# Patient Record
Sex: Female | Born: 1935 | Race: White | Hispanic: No | Marital: Married | Smoking: Never smoker
Health system: Southern US, Community
[De-identification: ages and names within clinical notes are randomized; demographics above are authoritative.]

## PROBLEM LIST (undated history)

## (undated) DIAGNOSIS — I10 Essential (primary) hypertension: Secondary | ICD-10-CM

## (undated) DIAGNOSIS — I4891 Unspecified atrial fibrillation: Secondary | ICD-10-CM

## (undated) DIAGNOSIS — I2510 Atherosclerotic heart disease of native coronary artery without angina pectoris: Secondary | ICD-10-CM

## (undated) DIAGNOSIS — K219 Gastro-esophageal reflux disease without esophagitis: Secondary | ICD-10-CM

## (undated) DIAGNOSIS — I509 Heart failure, unspecified: Secondary | ICD-10-CM

## (undated) DIAGNOSIS — I272 Pulmonary hypertension, unspecified: Secondary | ICD-10-CM

## (undated) DIAGNOSIS — B029 Zoster without complications: Secondary | ICD-10-CM

## (undated) DIAGNOSIS — N189 Chronic kidney disease, unspecified: Secondary | ICD-10-CM

## (undated) HISTORY — PX: MASTECTOMY: SHX3

## (undated) HISTORY — PX: MITRAL VALVE REPAIR: SHX2039

## (undated) HISTORY — PX: THORACENTESIS: SHX235

## (undated) HISTORY — PX: PACEMAKER PLACEMENT: SHX43

## (undated) HISTORY — PX: BACK SURGERY: SHX140

## (undated) HISTORY — PX: EYE SURGERY: SHX253

## (undated) HISTORY — PX: CORONARY ARTERY BYPASS GRAFT: SHX141

## (undated) HISTORY — PX: CARDIAC CATHETERIZATION: SHX172

---

## 1998-10-14 ENCOUNTER — Ambulatory Visit (HOSPITAL_COMMUNITY): Admission: RE | Admit: 1998-10-14 | Discharge: 1998-10-14 | Payer: Self-pay | Admitting: Obstetrics & Gynecology

## 2003-06-05 ENCOUNTER — Other Ambulatory Visit: Payer: Self-pay

## 2004-09-01 ENCOUNTER — Ambulatory Visit: Payer: Self-pay | Admitting: Family Medicine

## 2005-07-29 ENCOUNTER — Ambulatory Visit: Payer: Self-pay | Admitting: Unknown Physician Specialty

## 2005-09-14 ENCOUNTER — Ambulatory Visit: Payer: Self-pay | Admitting: Family Medicine

## 2006-08-03 ENCOUNTER — Ambulatory Visit: Payer: Self-pay | Admitting: Family Medicine

## 2006-09-16 ENCOUNTER — Ambulatory Visit: Payer: Self-pay | Admitting: Family Medicine

## 2007-09-19 ENCOUNTER — Ambulatory Visit: Payer: Self-pay | Admitting: Family Medicine

## 2008-09-19 ENCOUNTER — Ambulatory Visit: Payer: Self-pay | Admitting: Family Medicine

## 2009-01-07 ENCOUNTER — Ambulatory Visit: Payer: Self-pay | Admitting: Family Medicine

## 2009-03-21 ENCOUNTER — Ambulatory Visit: Payer: Self-pay | Admitting: Unknown Physician Specialty

## 2009-10-23 ENCOUNTER — Ambulatory Visit: Payer: Self-pay | Admitting: Unknown Physician Specialty

## 2009-11-14 ENCOUNTER — Ambulatory Visit: Payer: Self-pay | Admitting: Unknown Physician Specialty

## 2010-04-28 ENCOUNTER — Ambulatory Visit: Payer: Self-pay | Admitting: Unknown Physician Specialty

## 2010-05-05 ENCOUNTER — Inpatient Hospital Stay: Payer: Self-pay | Admitting: Internal Medicine

## 2010-12-16 ENCOUNTER — Ambulatory Visit: Payer: Self-pay | Admitting: Unknown Physician Specialty

## 2011-09-08 ENCOUNTER — Ambulatory Visit: Payer: Self-pay | Admitting: Cardiology

## 2011-09-08 LAB — PROTIME-INR: INR: 0.9

## 2011-12-08 ENCOUNTER — Encounter: Payer: Self-pay | Admitting: Cardiology

## 2012-01-08 ENCOUNTER — Encounter: Payer: Self-pay | Admitting: Cardiology

## 2012-02-07 ENCOUNTER — Encounter: Payer: Self-pay | Admitting: Cardiology

## 2012-03-09 ENCOUNTER — Encounter: Payer: Self-pay | Admitting: Cardiology

## 2012-04-09 ENCOUNTER — Encounter: Payer: Self-pay | Admitting: Cardiology

## 2012-10-12 ENCOUNTER — Ambulatory Visit: Payer: Self-pay | Admitting: Internal Medicine

## 2013-02-18 ENCOUNTER — Emergency Department: Payer: Self-pay | Admitting: Emergency Medicine

## 2013-02-18 LAB — CBC
HCT: 35.5 % (ref 35.0–47.0)
HGB: 11.8 g/dL — ABNORMAL LOW (ref 12.0–16.0)
MCH: 29.7 pg (ref 26.0–34.0)
MCHC: 33.3 g/dL (ref 32.0–36.0)
MCV: 89 fL (ref 80–100)
Platelet: 177 10*3/uL (ref 150–440)
RBC: 3.97 10*6/uL (ref 3.80–5.20)

## 2013-02-18 LAB — COMPREHENSIVE METABOLIC PANEL
Albumin: 3.6 g/dL (ref 3.4–5.0)
Anion Gap: 4 — ABNORMAL LOW (ref 7–16)
Bilirubin,Total: 0.3 mg/dL (ref 0.2–1.0)
Calcium, Total: 9.2 mg/dL (ref 8.5–10.1)
Creatinine: 1.03 mg/dL (ref 0.60–1.30)
EGFR (African American): 60 — ABNORMAL LOW
Glucose: 92 mg/dL (ref 65–99)
Osmolality: 278 (ref 275–301)

## 2013-02-18 LAB — TROPONIN I: Troponin-I: <0.02 ng/mL

## 2013-02-18 LAB — URINALYSIS, COMPLETE
Glucose,UR: NEGATIVE mg/dL (ref 0–75)
Leukocyte Esterase: NEGATIVE
Nitrite: NEGATIVE
RBC,UR: <1 /HPF (ref 0–5)
Squamous Epithelial: <1

## 2013-02-18 LAB — PROTIME-INR: Prothrombin Time: 24.9 secs — ABNORMAL HIGH (ref 11.5–14.7)

## 2013-11-03 ENCOUNTER — Ambulatory Visit: Payer: Self-pay | Admitting: Internal Medicine

## 2014-09-17 ENCOUNTER — Other Ambulatory Visit: Payer: Self-pay | Admitting: Specialist

## 2014-09-17 DIAGNOSIS — J986 Disorders of diaphragm: Secondary | ICD-10-CM

## 2014-09-25 ENCOUNTER — Ambulatory Visit
Admission: RE | Admit: 2014-09-25 | Discharge: 2014-09-25 | Disposition: A | Discharge status: Home / Self Care | Payer: Medicare Other | Source: Ambulatory Visit | Attending: Specialist | Admitting: Specialist

## 2014-09-25 DIAGNOSIS — J90 Pleural effusion, not elsewhere classified: Secondary | ICD-10-CM | POA: Insufficient documentation

## 2014-09-25 DIAGNOSIS — J986 Disorders of diaphragm: Secondary | ICD-10-CM

## 2014-09-25 DIAGNOSIS — R0609 Other forms of dyspnea: Secondary | ICD-10-CM | POA: Diagnosis present

## 2014-09-25 DIAGNOSIS — Z950 Presence of cardiac pacemaker: Secondary | ICD-10-CM | POA: Diagnosis not present

## 2014-10-16 ENCOUNTER — Other Ambulatory Visit: Payer: Self-pay | Admitting: Specialist

## 2014-10-16 ENCOUNTER — Observation Stay
Admission: EM | Admit: 2014-10-16 | Discharge: 2014-10-17 | Disposition: A | Discharge status: Home / Self Care | Payer: Medicare Other | Attending: Internal Medicine | Admitting: Internal Medicine

## 2014-10-16 ENCOUNTER — Encounter: Payer: Self-pay | Admitting: *Deleted

## 2014-10-16 ENCOUNTER — Emergency Department: Payer: Medicare Other

## 2014-10-16 DIAGNOSIS — Z79899 Other long term (current) drug therapy: Secondary | ICD-10-CM | POA: Diagnosis not present

## 2014-10-16 DIAGNOSIS — R748 Abnormal levels of other serum enzymes: Secondary | ICD-10-CM | POA: Diagnosis not present

## 2014-10-16 DIAGNOSIS — R110 Nausea: Secondary | ICD-10-CM | POA: Diagnosis not present

## 2014-10-16 DIAGNOSIS — J90 Pleural effusion, not elsewhere classified: Secondary | ICD-10-CM | POA: Insufficient documentation

## 2014-10-16 DIAGNOSIS — R918 Other nonspecific abnormal finding of lung field: Secondary | ICD-10-CM | POA: Insufficient documentation

## 2014-10-16 DIAGNOSIS — R1030 Lower abdominal pain, unspecified: Secondary | ICD-10-CM | POA: Diagnosis not present

## 2014-10-16 DIAGNOSIS — Z880 Allergy status to penicillin: Secondary | ICD-10-CM | POA: Insufficient documentation

## 2014-10-16 DIAGNOSIS — Z881 Allergy status to other antibiotic agents status: Secondary | ICD-10-CM | POA: Diagnosis not present

## 2014-10-16 DIAGNOSIS — I2510 Atherosclerotic heart disease of native coronary artery without angina pectoris: Secondary | ICD-10-CM | POA: Diagnosis not present

## 2014-10-16 DIAGNOSIS — Z95810 Presence of automatic (implantable) cardiac defibrillator: Secondary | ICD-10-CM | POA: Diagnosis not present

## 2014-10-16 DIAGNOSIS — R7989 Other specified abnormal findings of blood chemistry: Secondary | ICD-10-CM

## 2014-10-16 DIAGNOSIS — R05 Cough: Secondary | ICD-10-CM | POA: Diagnosis not present

## 2014-10-16 DIAGNOSIS — Z951 Presence of aortocoronary bypass graft: Secondary | ICD-10-CM | POA: Diagnosis not present

## 2014-10-16 DIAGNOSIS — Z7982 Long term (current) use of aspirin: Secondary | ICD-10-CM | POA: Insufficient documentation

## 2014-10-16 DIAGNOSIS — K5900 Constipation, unspecified: Secondary | ICD-10-CM | POA: Diagnosis present

## 2014-10-16 DIAGNOSIS — E785 Hyperlipidemia, unspecified: Secondary | ICD-10-CM | POA: Diagnosis not present

## 2014-10-16 DIAGNOSIS — I5022 Chronic systolic (congestive) heart failure: Secondary | ICD-10-CM | POA: Insufficient documentation

## 2014-10-16 DIAGNOSIS — R0602 Shortness of breath: Secondary | ICD-10-CM | POA: Diagnosis not present

## 2014-10-16 DIAGNOSIS — R0600 Dyspnea, unspecified: Secondary | ICD-10-CM

## 2014-10-16 DIAGNOSIS — N184 Chronic kidney disease, stage 4 (severe): Secondary | ICD-10-CM | POA: Diagnosis present

## 2014-10-16 DIAGNOSIS — K219 Gastro-esophageal reflux disease without esophagitis: Secondary | ICD-10-CM | POA: Diagnosis not present

## 2014-10-16 DIAGNOSIS — R778 Other specified abnormalities of plasma proteins: Secondary | ICD-10-CM | POA: Diagnosis present

## 2014-10-16 DIAGNOSIS — Z7901 Long term (current) use of anticoagulants: Secondary | ICD-10-CM | POA: Diagnosis not present

## 2014-10-16 DIAGNOSIS — I4891 Unspecified atrial fibrillation: Secondary | ICD-10-CM | POA: Insufficient documentation

## 2014-10-16 DIAGNOSIS — Z882 Allergy status to sulfonamides status: Secondary | ICD-10-CM | POA: Insufficient documentation

## 2014-10-16 HISTORY — DX: Chronic kidney disease, unspecified: N18.9

## 2014-10-16 HISTORY — DX: Atherosclerotic heart disease of native coronary artery without angina pectoris: I25.10

## 2014-10-16 HISTORY — DX: Unspecified atrial fibrillation: I48.91

## 2014-10-16 HISTORY — DX: Heart failure, unspecified: I50.9

## 2014-10-16 HISTORY — DX: Pulmonary hypertension, unspecified: I27.20

## 2014-10-16 HISTORY — DX: Zoster without complications: B02.9

## 2014-10-16 HISTORY — DX: Gastro-esophageal reflux disease without esophagitis: K21.9

## 2014-10-16 LAB — CBC
HCT: 33.6 % — ABNORMAL LOW (ref 35.0–47.0)
Hemoglobin: 11 g/dL — ABNORMAL LOW (ref 12.0–16.0)
MCH: 27.1 pg (ref 26.0–34.0)
MCHC: 32.6 g/dL (ref 32.0–36.0)
MCV: 83.1 fL (ref 80.0–100.0)
Platelets: 218 10*3/uL (ref 150–440)
RBC: 4.04 MIL/uL (ref 3.80–5.20)
RDW: 15.4 % — AB (ref 11.5–14.5)
WBC: 7.8 10*3/uL (ref 3.6–11.0)

## 2014-10-16 LAB — LIPASE, BLOOD: LIPASE: 24 U/L (ref 22–51)

## 2014-10-16 LAB — COMPREHENSIVE METABOLIC PANEL
ALBUMIN: 3.6 g/dL (ref 3.5–5.0)
ALT: 34 U/L (ref 14–54)
ANION GAP: 14 (ref 5–15)
AST: 49 U/L — ABNORMAL HIGH (ref 15–41)
Alkaline Phosphatase: 87 U/L (ref 38–126)
BUN: 43 mg/dL — ABNORMAL HIGH (ref 6–20)
CO2: 22 mmol/L (ref 22–32)
Calcium: 8.9 mg/dL (ref 8.9–10.3)
Chloride: 98 mmol/L — ABNORMAL LOW (ref 101–111)
Creatinine, Ser: 2.3 mg/dL — ABNORMAL HIGH (ref 0.44–1.00)
GFR calc Af Amer: 22 mL/min — ABNORMAL LOW (ref >60)
GFR, EST NON AFRICAN AMERICAN: 19 mL/min — AB (ref >60)
Glucose, Bld: 119 mg/dL — ABNORMAL HIGH (ref 65–99)
Potassium: 4.5 mmol/L (ref 3.5–5.1)
Sodium: 134 mmol/L — ABNORMAL LOW (ref 135–145)
TOTAL PROTEIN: 6.6 g/dL (ref 6.5–8.1)
Total Bilirubin: 0.9 mg/dL (ref 0.3–1.2)

## 2014-10-16 LAB — TROPONIN I: Troponin I: 0.13 ng/mL — ABNORMAL HIGH (ref <0.031)

## 2014-10-16 MED ORDER — POLYETHYLENE GLYCOL 3350 17 GM/SCOOP PO POWD: 17.0000 g | Freq: Every day | ORAL | Status: DC

## 2014-10-16 NOTE — ED Notes (Addendum)
Pt to triage via w/c with no distress noted; reports SOB, nausea x 3-4 weeks; has thoracentesis sched for Monday with Dr Raul Del; pt also c/o lower abd pain; pt with pacemaker and defib; swelling to LE(s), nonprod cough

## 2014-10-16 NOTE — ED Notes (Signed)
Lab called with troponin of 0.13  Dr sung aware.

## 2014-10-16 NOTE — ED Provider Notes (Signed)
[facility] Emergency Department Provider Note  Time seen: 10:24 PM  I have reviewed the triage vital signs and the nursing notes.   HISTORY  Chief Complaint Shortness of Breath    HPI Sheryl Hughes is a 81 y.o. female with past medical history of atrial fibrillation on Coumadin, congestive heart failure on Lasix with a known pleural effusion, presents the emergency department for shortness of breath as well as constipation. According to the patient her main concern is her irregular bowel movements. The patient states for the past 6 weeks she has been having trouble with her bowel movements. She states she has a bowel movement every few days, but is very difficult for her. She is taking Colace twice daily which she states is helping, but she continues to go several days between bowel movements. Her last bowel movement was this morning which she states was fairly normal for her. She states she will get a lot of gas throughout the day including today. As far as her pleural effusion, and shortness of breath, she is scheduled for a thoracentesis in 4 days. Patient was initially scheduled for her thoracentesis tomorrow, but she did not discontinue her Coumadin, so they delayed her until Monday. Patient states her shortness of breath has not acutely worsened, and she states she would not come to the emergency department for that, she is here for her abdominal discomfort and constipation issues. Denies any abdominal pain currently. Denies any distention. Denies any vomiting although she states she is nauseated. The patient states she stays nauseated most days, with a decreased appetite but does not know why. Denies any fever. Denies dysuria.     Past Medical History  Diagnosis Date  . Atrial fibrillation     There are no active problems to display for this patient.   No past surgical history on file.  Current Outpatient Rx  Name  Route  Sig  Dispense  Refill  .  acetaminophen (TYLENOL) 325 MG tablet   Oral   Take 650 mg by mouth every 6 (six) hours as needed.         Marland Kitchen aspirin EC 81 MG tablet   Oral   Take 81 mg by mouth daily.         Marland Kitchen atorvastatin (LIPITOR) 20 MG tablet   Oral   Take 20 mg by mouth daily.         Marland Kitchen CALCIUM-VITAMIN D PO   Oral   Take 1 tablet by mouth daily.         . Cholecalciferol 2000 UNITS TABS   Oral   Take 1 tablet by mouth daily.         Marland Kitchen docusate sodium (COLACE) 100 MG capsule   Oral   Take 100 mg by mouth 2 (two) times daily.         Marland Kitchen dronedarone (MULTAQ) 400 MG tablet   Oral   Take 400 mg by mouth 2 (two) times daily with a meal.         . furosemide (LASIX) 40 MG tablet   Oral   Take 40 mg by mouth daily.         Marland Kitchen losartan (COZAAR) 25 MG tablet   Oral   Take 25 mg by mouth daily.         . metoprolol succinate (TOPROL-XL) 25 MG 24 hr tablet   Oral   Take 25 mg by mouth 2 (two) times daily.         Marland Kitchen  omeprazole (PRILOSEC) 20 MG capsule   Oral   Take 20 mg by mouth daily.         . potassium chloride (MICRO-K) 10 MEQ CR capsule   Oral   Take 10 mEq by mouth daily.         . vitamin B-12 (CYANOCOBALAMIN) 1000 MCG tablet   Oral   Take 1,000 mcg by mouth daily.         Marland Kitchen warfarin (COUMADIN) 5 MG tablet   Oral   Take 5 mg by mouth once a week. Pt takes 1-2 times weekly           Allergies Biaxin; Flagyl; Penicillins; and Tetracyclines & related  No family history on file.  Social History History  Substance Use Topics  . Smoking status: Not on file  . Smokeless tobacco: Not on file  . Alcohol Use: Not on file    Review of Systems Constitutional: Negative for fever. Cardiovascular: Negative for chest pain. Respiratory: Positive for shortness breath, but patient states this is unchanged. Gastrointestinal: Intermittent abdominal discomfort, positive for nausea, negative for vomiting or diarrhea. Positive for intermittent constipation. Neurological:  Negative for headaches, focal weakness or numbness. 10-point ROS otherwise negative.  ____________________________________________   PHYSICAL EXAM:  VITAL SIGNS: ED Triage Vitals  Enc Vitals Group     BP 10/16/14 2048 101/59 mmHg     Pulse Rate 10/16/14 2048 112     Resp 10/16/14 2140 24     Temp 10/16/14 2048 97.9 F (36.6 C)     Temp Source 10/16/14 2048 Oral     SpO2 10/16/14 2048 100 %     Weight 10/16/14 2048 109 lb (49.442 kg)     Height 10/16/14 2048 5\' 4"  (1.626 m)     Head Cir --      Peak Flow --      Pain Score 10/16/14 2044 10     Pain Loc --      Pain Edu? --      Excl. in [place]? --     Constitutional: Alert and oriented. Well appearing and in no distress. Eyes: Normal exam ENT   Mouth/Throat: Mucous membranes are moist. Cardiovascular: Regular rhythm, rate around 100 110 bpm.  Respiratory: Normal respiratory effort without tachypnea nor retractions. Breath sounds are clear and equal bilaterally. No wheezes/rales/rhonchi. Gastrointestinal: Soft and nontender. No distention. Dull percussion. Musculoskeletal: Nontender with normal range of motion in all extremities.  Neurologic:  Normal speech and language. No gross focal neurologic deficits Skin:  Skin is warm, dry and intact.  Psychiatric: Mood and affect are normal. Speech and behavior are normal.  ____________________________________________    EKG  EKG reviewed and interpreted by me shows what appears to be atrial fibrillation 112 bpm, widened QRS, nonspecific ST changes are present. No ST elevations noted.  ____________________________________________    RADIOLOGY  Three-way abdomen does not appear to show any acute abnormalities. Small bilateral pleural effusions.  ____________________________________________   INITIAL IMPRESSION / ASSESSMENT AND PLAN / ED COURSE  Pertinent labs & imaging results that were available during my care of the patient were reviewed by me and considered in my  medical decision making (see chart for details).  Patient here for various complaints including shortness of breath, and constipation. She states she is always short of breath, and was told she will be so until she has this thoracentesis done on Monday. Patient's main complaint today is constipation but she states she had a normal bowel movement  this morning. Denies any abdominal pain currently, does state nausea which she states is been constant for 6 weeks. Denies any vomiting. Denies diarrhea or fever. We will check basic labs, abdominal and chest x-rays, and monitor closely in the emergency department while awaiting laboratory results.  Three-way abdomen x-rays largely unremarkable. Small pleural effusions. Overall the patient appears very well. Labs are currently pending. If labs are within normal limits anticipated discharge home with primary care follow-up. Patient is agreeable to this plan. I also discussed with the patient MiraLAX as needed for constipation.   Labs pending, patient care signed out to Dr. Beather Arbour. ____________________________________________   FINAL CLINICAL IMPRESSION(S) / ED DIAGNOSES  Constipation Dyspnea   Harvest Dark, MD 10/16/14 2314

## 2014-10-16 NOTE — ED Notes (Signed)
Resumed care from sonjia rn. Pt alert.  Pt has sob tonight.  No chest pain. Family with pt.

## 2014-10-17 ENCOUNTER — Encounter: Payer: Self-pay | Admitting: Internal Medicine

## 2014-10-17 DIAGNOSIS — R0602 Shortness of breath: Secondary | ICD-10-CM | POA: Diagnosis not present

## 2014-10-17 DIAGNOSIS — I4891 Unspecified atrial fibrillation: Secondary | ICD-10-CM | POA: Diagnosis present

## 2014-10-17 DIAGNOSIS — E785 Hyperlipidemia, unspecified: Secondary | ICD-10-CM | POA: Diagnosis present

## 2014-10-17 DIAGNOSIS — K219 Gastro-esophageal reflux disease without esophagitis: Secondary | ICD-10-CM | POA: Diagnosis present

## 2014-10-17 DIAGNOSIS — I5022 Chronic systolic (congestive) heart failure: Secondary | ICD-10-CM | POA: Diagnosis present

## 2014-10-17 DIAGNOSIS — K5900 Constipation, unspecified: Secondary | ICD-10-CM | POA: Diagnosis present

## 2014-10-17 DIAGNOSIS — R7989 Other specified abnormal findings of blood chemistry: Secondary | ICD-10-CM | POA: Diagnosis present

## 2014-10-17 DIAGNOSIS — I2510 Atherosclerotic heart disease of native coronary artery without angina pectoris: Secondary | ICD-10-CM | POA: Diagnosis present

## 2014-10-17 DIAGNOSIS — N184 Chronic kidney disease, stage 4 (severe): Secondary | ICD-10-CM | POA: Diagnosis present

## 2014-10-17 DIAGNOSIS — R778 Other specified abnormalities of plasma proteins: Secondary | ICD-10-CM | POA: Diagnosis present

## 2014-10-17 LAB — URINALYSIS COMPLETE WITH MICROSCOPIC (ARMC ONLY)
BACTERIA UA: NONE SEEN
Bilirubin Urine: NEGATIVE
Glucose, UA: NEGATIVE mg/dL
Ketones, ur: NEGATIVE mg/dL
LEUKOCYTES UA: NEGATIVE
Nitrite: NEGATIVE
Protein, ur: 30 mg/dL — AB
Specific Gravity, Urine: 1.011 (ref 1.005–1.030)
pH: 5 (ref 5.0–8.0)

## 2014-10-17 LAB — CBC
HEMATOCRIT: 31 % — AB (ref 35.0–47.0)
Hemoglobin: 10.1 g/dL — ABNORMAL LOW (ref 12.0–16.0)
MCH: 27.2 pg (ref 26.0–34.0)
MCHC: 32.6 g/dL (ref 32.0–36.0)
MCV: 83.4 fL (ref 80.0–100.0)
Platelets: 176 10*3/uL (ref 150–440)
RBC: 3.71 MIL/uL — AB (ref 3.80–5.20)
RDW: 15.3 % — ABNORMAL HIGH (ref 11.5–14.5)
WBC: 6.2 10*3/uL (ref 3.6–11.0)

## 2014-10-17 LAB — BASIC METABOLIC PANEL
ANION GAP: 11 (ref 5–15)
BUN: 43 mg/dL — AB (ref 6–20)
CALCIUM: 8.6 mg/dL — AB (ref 8.9–10.3)
CO2: 26 mmol/L (ref 22–32)
CREATININE: 2.27 mg/dL — AB (ref 0.44–1.00)
Chloride: 99 mmol/L — ABNORMAL LOW (ref 101–111)
GFR, EST AFRICAN AMERICAN: 22 mL/min — AB (ref >60)
GFR, EST NON AFRICAN AMERICAN: 19 mL/min — AB (ref >60)
GLUCOSE: 109 mg/dL — AB (ref 65–99)
POTASSIUM: 4.2 mmol/L (ref 3.5–5.1)
Sodium: 136 mmol/L (ref 135–145)

## 2014-10-17 LAB — PROTIME-INR
INR: 5.11
INR: 5.12 — AB
PROTHROMBIN TIME: 47.1 s — AB (ref 11.4–15.0)
Prothrombin Time: 47 seconds — ABNORMAL HIGH (ref 11.4–15.0)

## 2014-10-17 LAB — TROPONIN I: Troponin I: 0.09 ng/mL — ABNORMAL HIGH (ref <0.031)

## 2014-10-17 MED ORDER — ATORVASTATIN CALCIUM 20 MG PO TABS
20.0000 mg | ORAL_TABLET | Freq: Every day | ORAL | Status: DC
  Administered 2014-10-17: 20 mg via ORAL
  Filled 2014-10-17: qty 1

## 2014-10-17 MED ORDER — METOPROLOL SUCCINATE ER 25 MG PO TB24
25.0000 mg | ORAL_TABLET | Freq: Two times a day (BID) | ORAL | Status: DC
  Administered 2014-10-17: 25 mg via ORAL
  Filled 2014-10-17: qty 1

## 2014-10-17 MED ORDER — SENNOSIDES-DOCUSATE SODIUM 8.6-50 MG PO TABS
1.0000 | ORAL_TABLET | Freq: Every evening | ORAL | Status: DC | PRN
Start: 2014-10-17 — End: 2014-10-17

## 2014-10-17 MED ORDER — ASPIRIN EC 81 MG PO TBEC
81.0000 mg | DELAYED_RELEASE_TABLET | Freq: Every day | ORAL | Status: DC
  Filled 2014-10-17: qty 1

## 2014-10-17 MED ORDER — ACETAMINOPHEN 325 MG PO TABS: 650.0000 mg | ORAL_TABLET | Freq: Four times a day (QID) | ORAL | Status: DC | PRN

## 2014-10-17 MED ORDER — PANTOPRAZOLE SODIUM 40 MG PO TBEC
40.0000 mg | DELAYED_RELEASE_TABLET | Freq: Every day | ORAL | Status: DC
  Administered 2014-10-17: 40 mg via ORAL
  Filled 2014-10-17: qty 1

## 2014-10-17 MED ORDER — DRONEDARONE HCL 400 MG PO TABS
400.0000 mg | ORAL_TABLET | Freq: Two times a day (BID) | ORAL | Status: DC
  Administered 2014-10-17: 400 mg via ORAL
  Filled 2014-10-17 (×3): qty 1

## 2014-10-17 MED ORDER — POLYETHYLENE GLYCOL 3350 17 G PO PACK: 17.0000 g | PACK | Freq: Every day | ORAL | Status: DC

## 2014-10-17 MED ORDER — ONDANSETRON HCL 4 MG/2ML IJ SOLN
4.0000 mg | Freq: Four times a day (QID) | INTRAMUSCULAR | Status: DC | PRN
  Administered 2014-10-17: 4 mg via INTRAVENOUS

## 2014-10-17 MED ORDER — ONDANSETRON HCL 4 MG PO TABS: 4.0000 mg | ORAL_TABLET | Freq: Four times a day (QID) | ORAL | Status: DC | PRN

## 2014-10-17 MED ORDER — ACETAMINOPHEN 650 MG RE SUPP: 650.0000 mg | Freq: Four times a day (QID) | RECTAL | Status: DC | PRN

## 2014-10-17 MED ORDER — POLYETHYLENE GLYCOL 3350 17 G PO PACK
17.0000 g | PACK | Freq: Once | ORAL | Status: AC
  Administered 2014-10-17: 17 g via ORAL
  Filled 2014-10-17: qty 1

## 2014-10-17 MED ORDER — SODIUM CHLORIDE 0.9 % IJ SOLN
3.0000 mL | Freq: Two times a day (BID) | INTRAMUSCULAR | Status: DC
Start: 2014-10-17 — End: 2014-10-17
  Administered 2014-10-17 (×2): 3 mL via INTRAVENOUS

## 2014-10-17 NOTE — Discharge Instructions (Signed)
Constipation Constipation is when a person has fewer than three bowel movements a week, has difficulty having a bowel movement, or has stools that are dry, hard, or larger than normal. As people grow older, constipation is more common. If you try to fix constipation with medicines that make you have a bowel movement (laxatives), the problem may get worse. Long-term laxative use may cause the muscles of the colon to become weak. A low-fiber diet, not taking in enough fluids, and taking certain medicines may make constipation worse.  CAUSES   Certain medicines, such as antidepressants, pain medicine, iron supplements, antacids, and water pills.   Certain diseases, such as diabetes, irritable bowel syndrome (IBS), thyroid disease, or depression.   Not drinking enough water.   Not eating enough fiber-rich foods.   Stress or travel.   Lack of physical activity or exercise.   Ignoring the urge to have a bowel movement.   Using laxatives too much.  SIGNS AND SYMPTOMS   Having fewer than three bowel movements a week.   Straining to have a bowel movement.   Having stools that are hard, dry, or larger than normal.   Feeling full or bloated.   Pain in the lower abdomen.   Not feeling relief after having a bowel movement.  DIAGNOSIS  Your health care provider will take a medical history and perform a physical exam. Further testing may be done for severe constipation. Some tests may include:  A barium enema X-ray to examine your rectum, colon, and, sometimes, your small intestine.   A sigmoidoscopy to examine your lower colon.   A colonoscopy to examine your entire colon. TREATMENT  Treatment will depend on the severity of your constipation and what is causing it. Some dietary treatments include drinking more fluids and eating more fiber-rich foods. Lifestyle treatments may include regular exercise. If these diet and lifestyle recommendations do not help, your health  care provider may recommend taking over-the-counter laxative medicines to help you have bowel movements. Prescription medicines may be prescribed if over-the-counter medicines do not work.  HOME CARE INSTRUCTIONS   Eat foods that have a lot of fiber, such as fruits, vegetables, whole grains, and beans.  Limit foods high in fat and processed sugars, such as french fries, hamburgers, cookies, candies, and soda.   A fiber supplement may be added to your diet if you cannot get enough fiber from foods.   Drink enough fluids to keep your urine clear or pale yellow.   Exercise regularly or as directed by your health care provider.   Go to the restroom when you have the urge to go. Do not hold it.   Only take over-the-counter or prescription medicines as directed by your health care provider. Do not take other medicines for constipation without talking to your health care provider first.  [place] IF:   You have bright red blood in your stool.   Your constipation lasts for more than 4 days or gets worse.   You have abdominal or rectal pain.   You have thin, pencil-like stools.   You have unexplained weight loss. MAKE SURE YOU:   Understand these instructions.  Will watch your condition.  Will get help right away if you are not doing well or get worse. Document Released: 11/22/2003 Document Revised: 02/28/2013 Document Reviewed: 12/05/2012 [facility] Patient Information 2015 [place], [place]. This information is not intended to replace advice given to you by your health care provider. Make sure you discuss any questions  you have with your health care provider.   Cardiac diet  Activity as tolerated  Hold coumadin till your procedure on Monday.  Follow up with Dr. Irwin Brakeman.  Please call Dr. Vira Agar if your nausea doesn't improve.

## 2014-10-17 NOTE — H&P (Signed)
[facility] [place] NAME: Sheryl Hughes    MR#:  [id number]  DATE OF BIRTH:  03-Nov-1931  DATE OF ADMISSION:  10/16/2014  PRIMARY CARE PHYSICIAN: Kirk Ruths., MD   REQUESTING/REFERRING PHYSICIAN: Beather Arbour, M.D.  CHIEF COMPLAINT:   Chief Complaint  Patient presents with  . Shortness of Breath    HISTORY OF PRESENT ILLNESS:  Kei Hugger  is a 81 y.o. female who presents with progressive GI upset with nausea. Patient states that she's been having these symptoms intermittently for the past 2 weeks. Patient has a significant history of coronary disease, with prior bypass surgery. She also has a history of significant valvular abnormalities, with mitral and tricuspid valve repair surgery in the past. She has atrial fibrillation, and is on Coumadin for the same. She has bilateral pleural effusions which required thoracentesis in the past, and she is also been having shortness of breath recently with her GI upset. She was scheduled for thoracentesis, but had to have this date moved as she was not told, or did not recall to hold her Coumadin and her INR has not fallen to safe level for the procedure to be performed. On evaluation in the ED today her troponin is mildly positive at 0.13. Hospitalists were called for admission for rule out ACS. Of note her INR was supratherapeutic at 5.  PAST MEDICAL HISTORY:   Past Medical History  Diagnosis Date  . Atrial fibrillation   . CHF (congestive heart failure)   . Shingles     PAST SURGICAL HISTORY:   Past Surgical History  Procedure Laterality Date  . Mastectomy    . Cardiac catheterization    . Back surgery    . Eye surgery    . Thoracentesis Bilateral   . Pacemaker placement      defibrillator    SOCIAL HISTORY:   History  Substance Use Topics  . Smoking status: Never Smoker   . Smokeless tobacco: Not on file  . Alcohol Use: No    FAMILY HISTORY:  No family history on  file.  DRUG ALLERGIES:   Allergies  Allergen Reactions  . Biaxin [Clarithromycin] Other (See Comments)    Numb lips  . Flagyl [Metronidazole] Other (See Comments)    Numb lips  . Penicillins Rash  . Tetracyclines & Related Rash    MEDICATIONS AT HOME:   Prior to Admission medications   Medication Sig Start Date End Date Taking? Authorizing Provider  acetaminophen (TYLENOL) 325 MG tablet Take 650 mg by mouth every 6 (six) hours as needed.   Yes Historical Provider, MD  aspirin EC 81 MG tablet Take 81 mg by mouth daily.   Yes Historical Provider, MD  atorvastatin (LIPITOR) 20 MG tablet Take 20 mg by mouth daily.   Yes Historical Provider, MD  CALCIUM-VITAMIN D PO Take 1 tablet by mouth daily.   Yes Historical Provider, MD  Cholecalciferol 2000 UNITS TABS Take 1 tablet by mouth daily.   Yes Historical Provider, MD  docusate sodium (COLACE) 100 MG capsule Take 100 mg by mouth 2 (two) times daily.   Yes Historical Provider, MD  dronedarone (MULTAQ) 400 MG tablet Take 400 mg by mouth 2 (two) times daily with a meal.   Yes Historical Provider, MD  furosemide (LASIX) 40 MG tablet Take 40 mg by mouth daily.   Yes Historical Provider, MD  losartan (COZAAR) 25 MG tablet Take 25 mg by mouth daily.   Yes Historical Provider,  MD  metoprolol succinate (TOPROL-XL) 25 MG 24 hr tablet Take 25 mg by mouth 2 (two) times daily.   Yes Historical Provider, MD  omeprazole (PRILOSEC) 20 MG capsule Take 20 mg by mouth daily.   Yes Historical Provider, MD  potassium chloride (MICRO-K) 10 MEQ CR capsule Take 10 mEq by mouth daily.   Yes Historical Provider, MD  vitamin B-12 (CYANOCOBALAMIN) 1000 MCG tablet Take 1,000 mcg by mouth daily.   Yes Historical Provider, MD  warfarin (COUMADIN) 5 MG tablet Take 5 mg by mouth once a week. Pt takes 1-2 times weekly   Yes Historical Provider, MD  polyethylene glycol powder (MIRALAX) powder Take 17 g by mouth daily. 10/16/14   Harvest Dark, MD    REVIEW OF SYSTEMS:   Review of Systems  Constitutional: Positive for malaise/fatigue. Negative for fever, chills and weight loss.  HENT: Negative for ear pain, hearing loss and tinnitus.   Eyes: Negative for blurred vision, double vision, pain and redness.  Respiratory: Positive for shortness of breath. Negative for cough and hemoptysis.   Cardiovascular: Negative for chest pain, palpitations, orthopnea and leg swelling.  Gastrointestinal: Positive for nausea, vomiting and abdominal pain. Negative for diarrhea and constipation.  Genitourinary: Negative for dysuria, frequency and hematuria.  Musculoskeletal: Negative for back pain, joint pain and neck pain.  Skin:       No acne, rash, or lesions  Neurological: Negative for dizziness, tremors, focal weakness and weakness.  Endo/Heme/Allergies: Negative for polydipsia. Does not bruise/bleed easily.  Psychiatric/Behavioral: Negative for depression. The patient is not nervous/anxious and does not have insomnia.      VITAL SIGNS:   Filed Vitals:   10/16/14 2200 10/16/14 2230 10/16/14 2231 10/16/14 2302  BP: 110/72 102/69 102/69 95/75  Pulse: 108  108   Temp:      TempSrc:      Resp: 20 22 20 25   Height:      Weight:      SpO2: 98%  99%    Wt Readings from Last 3 Encounters:  10/16/14 49.442 kg (109 lb)    PHYSICAL EXAMINATION:  Physical Exam  Vitals reviewed. Constitutional: She is oriented to person, place, and time. She appears well-developed and well-nourished. No distress.  HENT:  Head: Normocephalic and atraumatic.  Mouth/Throat: Oropharynx is clear and moist.  Eyes: Conjunctivae and EOM are normal. Pupils are equal, round, and reactive to light. No scleral icterus.  Neck: Normal range of motion. Neck supple. No JVD present. No thyromegaly present.  Cardiovascular: Regular rhythm and intact distal pulses.  Exam reveals no gallop and no friction rub.   No murmur heard. Mild tachycardia  Respiratory: Effort normal and breath sounds normal. No  respiratory distress. She has no wheezes. She has no rales.  GI: Soft. Bowel sounds are normal. She exhibits no distension. There is no tenderness.  Musculoskeletal: Normal range of motion. She exhibits no edema.  No arthritis, no gout  Lymphadenopathy:    She has no cervical adenopathy.  Neurological: She is alert and oriented to person, place, and time. No cranial nerve deficit.  No dysarthria, no aphasia  Skin: Skin is warm and dry. No rash noted. No erythema.  Psychiatric: She has a normal mood and affect. Her behavior is normal. Judgment and thought content normal.    LABORATORY PANEL:   CBC  Recent Labs Lab 10/16/14 2246  WBC 7.8  HGB 11.0*  HCT 33.6*  PLT 218   ------------------------------------------------------------------------------------------------------------------  Chemistries   Recent Labs  Lab 10/16/14 2246  NA 134*  K 4.5  CL 98*  CO2 22  GLUCOSE 119*  BUN 43*  CREATININE 2.30*  CALCIUM 8.9  AST 49*  ALT 34  ALKPHOS 87  BILITOT 0.9   ------------------------------------------------------------------------------------------------------------------  Cardiac Enzymes  Recent Labs Lab 10/16/14 2246  TROPONINI 0.13*   ------------------------------------------------------------------------------------------------------------------  RADIOLOGY:  Dg Abd Acute W/chest  10/16/2014   CLINICAL DATA:  Subacute onset of shortness of breath and nausea. Lower abdominal pain. Nonproductive cough. Initial encounter.  EXAM: DG ABDOMEN ACUTE W/ 1V CHEST  COMPARISON:  Chest radiograph performed 05/05/2010, and CT of the abdomen and pelvis performed 10/23/2009  FINDINGS: The lungs are well-aerated. Small bilateral pleural effusions are seen, with bibasilar airspace opacities, likely reflecting atelectasis. Underlying peribronchial thickening and mild vascular congestion are seen. No pneumothorax is identified. The cardiomediastinal silhouette is mildly enlarged.  The patient is status post median sternotomy. A pacemaker/AICD is noted overlying the right chest wall, with leads ending overlying the right atrium and right ventricle.  The visualized bowel gas pattern is unremarkable. Scattered stool and air are seen within the colon; there is no evidence of small bowel dilatation to suggest obstruction. No free intra-abdominal air is identified on the provided upright view.  No acute osseous abnormalities are seen; the sacroiliac joints are unremarkable in appearance. Right convex lumbar scoliosis is noted.  IMPRESSION: 1. Unremarkable bowel gas pattern; no free intra-abdominal air seen. Small amount of stool noted in the colon. 2. Small bilateral pleural effusions, with bibasilar airspace opacities, likely reflecting atelectasis. Underlying peribronchial thickening and mild vascular congestion seen. Mild cardiomegaly noted. 3. Right convex lumbar scoliosis noted.   Electronically Signed   By: Garald Balding M.D.   On: 10/16/2014 22:32    EKG:   Orders placed or performed in visit on 02/18/13  . EKG 12-Lead  . EKG 12-Lead    IMPRESSION AND PLAN:  Principal Problem:   Elevated troponin - very mild elevation, some concern for ACS with her nausea and abdominal pain as an anginal equivalent. She did have an acute abdominal series in the ED which did not seem to indicate any sort of GI blockage. We'll trend her troponins, and consider cardiology consult in the morning depending on the results. Active Problems:   CAD (coronary artery disease) - continue her home medications for this   Chronic systolic congestive heart failure - severe, last EF in May be sure was 15-20%, continue her home medications for this, it is possible that her mildly positive troponin is due to some troponin leak from mild strain or severe heart failure. I'll have to hold her diuretic for now given her low end normal blood pressure.   A-fib - currently in sinus rhythm, on Coumadin for this with a  current supratherapeutic INR. Hold her Coumadin for now, and monitor her INR.   Chronic kidney disease (CKD), stage IV (severe) - hold all nephrotoxins, be judicious with any fluids to be given to her given her severe heart failure and stage IV CKD   HLD (hyperlipidemia) - continue home statin   GERD (gastroesophageal reflux disease) - PPI  All the records are reviewed and case discussed with ED provider. Management plans discussed with the patient and/or family.  DVT PROPHYLAXIS: Systemic anticoagulation  ADMISSION STATUS: Observation  CODE STATUS: Full Advance Directive Documentation        Most Recent Value   Type of Advance Directive  Living will   Pre-existing out of facility DNR order (  yellow form or pink MOST form)     "MOST" Form in Place?        TOTAL TIME TAKING CARE OF THIS PATIENT: 45 minutes.    Cecelia Graciano [place] 10/17/2014, 12:25 AM  [facility]  Office  [phone]  CC: Primary care physician; Kirk Ruths., MD

## 2014-10-17 NOTE — ED Provider Notes (Signed)
-----------------------------------------   12:04 AM on 10/17/2014 -----------------------------------------  Labs notable for renal insufficiency and elevated troponin. Updated patient and her family and discussed case with hospitalist who will evaluate patient in the ED for admission.  Paulette Blanch, MD 10/17/14 [place]

## 2014-10-17 NOTE — Progress Notes (Signed)
Patient d/c'd home. Education provided, no questions at this time. Patient to be picked up by husband and son. Telemetry removed. Wilnette Kales

## 2014-10-17 NOTE — ED Notes (Signed)
Computer downtime at [facility].  Pt moved to room and report called to floor nurse.

## 2014-10-17 NOTE — ED Notes (Signed)
md in with pt and family.   

## 2014-10-18 ENCOUNTER — Ambulatory Visit: Payer: Medicare Other

## 2014-10-18 NOTE — Discharge Summary (Signed)
[facility] NAME: Sheryl Hughes    MR#:  [id number]  DATE OF BIRTH:  1932/03/08  DATE OF ADMISSION:  10/16/2014 ADMITTING PHYSICIAN: Lance Coon, MD  DATE OF DISCHARGE: 10/17/2014  2:34 PM  PRIMARY CARE PHYSICIAN: Kirk Ruths., MD    ADMISSION DIAGNOSIS:  Dyspnea [R06.00] Elevated troponin [R79.89] Constipation, unspecified constipation type [K59.00]  DISCHARGE DIAGNOSIS:  Principal Problem:   Elevated troponin Active Problems:   CAD (coronary artery disease)   HLD (hyperlipidemia)   A-fib   GERD (gastroesophageal reflux disease)   Chronic kidney disease (CKD), stage IV (severe)   Chronic systolic congestive heart failure   Constipation   SECONDARY DIAGNOSIS:   Past Medical History  Diagnosis Date  . Atrial fibrillation   . CHF (congestive heart failure)   . Shingles   . CAD (coronary artery disease)   . Pulmonary hypertension   . GERD (gastroesophageal reflux disease)   . CKD (chronic kidney disease)      ADMITTING HISTORY  Sheryl Hughes is a 81 y.o. female who presents with progressive GI upset with nausea. Patient states that she's been having these symptoms intermittently for the past 2 weeks. Patient has a significant history of coronary disease, with prior bypass surgery. She also has a history of significant valvular abnormalities, with mitral and tricuspid valve repair surgery in the past. She has atrial fibrillation, and is on Coumadin for the same. She has bilateral pleural effusions which required thoracentesis in the past, and she is also been having shortness of breath recently with her GI upset. She was scheduled for thoracentesis, but had to have this date moved as she was not told, or did not recall to hold her Coumadin and her INR has not fallen to safe level for the procedure to be performed. On evaluation in the ED today her troponin is mildly positive at 0.13. Hospitalists were called for  admission for rule out ACS. Of note her INR was supratherapeutic at 5.   HOSPITAL COURSE:   Patient was admitted onto telemetry floor. Troponin was repeated and has trended down. She did not have any chest pain. EKG showed no acute changes. No arrhythmias on telemetry. Patient does have some chronic shortness of breath which is being worked up as outpatient and she is scheduled to go thoracentesis after 5 days. I did consider getting a thoracentesis as inpatient but patient INR is at 5 and also she's not needing any oxygen and has no fever. I requested her to follow-up for the thoracentesis as previously scheduled. She is to hold taking Coumadin till the procedure. Patient's nausea has resolved. This is secondary to constipation. I have added daily MiraLAX to her previously scheduled Colace. Abdominal x-ray showed no ileus. Patient does have a follow-up with Dr. Vira Agar as outpatient.  Stable for discharge.   CONSULTS OBTAINED:     DRUG ALLERGIES:   Allergies  Allergen Reactions  . Biaxin [Clarithromycin] Other (See Comments)    Numb lips  . Flagyl [Metronidazole] Other (See Comments)    Numb lips  . Penicillins Rash  . Tetracyclines & Related Rash    DISCHARGE MEDICATIONS:   Discharge Medication List as of 10/17/2014 10:46 AM    START taking these medications   Details  polyethylene glycol (MIRALAX / GLYCOLAX) packet Take 17 g by mouth daily., Starting 10/17/2014, Until Discontinued, Print    polyethylene glycol powder (MIRALAX) powder Take 17 g by mouth daily., Starting 10/16/2014, Until  Discontinued, Print      CONTINUE these medications which have NOT CHANGED   Details  acetaminophen (TYLENOL) 325 MG tablet Take 650 mg by mouth every 6 (six) hours as needed., Until Discontinued, Historical Med    aspirin EC 81 MG tablet Take 81 mg by mouth daily., Until Discontinued, Historical Med    atorvastatin (LIPITOR) 20 MG tablet Take 20 mg by mouth daily., Until Discontinued,  Historical Med    CALCIUM-VITAMIN D PO Take 1 tablet by mouth daily., Until Discontinued, Historical Med    Cholecalciferol 2000 UNITS TABS Take 1 tablet by mouth daily., Until Discontinued, Historical Med    docusate sodium (COLACE) 100 MG capsule Take 100 mg by mouth 2 (two) times daily., Until Discontinued, Historical Med    dronedarone (MULTAQ) 400 MG tablet Take 400 mg by mouth 2 (two) times daily with a meal., Until Discontinued, Historical Med    furosemide (LASIX) 40 MG tablet Take 40 mg by mouth daily., Until Discontinued, Historical Med    losartan (COZAAR) 25 MG tablet Take 25 mg by mouth daily., Until Discontinued, Historical Med    metoprolol succinate (TOPROL-XL) 25 MG 24 hr tablet Take 25 mg by mouth 2 (two) times daily., Until Discontinued, Historical Med    omeprazole (PRILOSEC) 20 MG capsule Take 20 mg by mouth daily., Until Discontinued, Historical Med    potassium chloride (MICRO-K) 10 MEQ CR capsule Take 10 mEq by mouth daily., Until Discontinued, Historical Med    vitamin B-12 (CYANOCOBALAMIN) 1000 MCG tablet Take 1,000 mcg by mouth daily., Until Discontinued, Historical Med    warfarin (COUMADIN) 5 MG tablet Take 5 mg by mouth once a week. Pt takes 1-2 times weekly, Until Discontinued, Historical Med         Today    VITAL SIGNS:  Blood pressure 98/60, pulse 106, temperature 98.1 F (36.7 C), temperature source Oral, resp. rate 19, height 5\' 4"  (1.626 m), weight 48.898 kg (107 lb 12.8 oz), SpO2 95 %.  I/O:   Intake/Output Summary (Last 24 hours) at 10/18/14 1253 Last data filed at 10/17/14 1341  Gross per 24 hour  Intake      0 ml  Output      0 ml  Net      0 ml    PHYSICAL EXAMINATION:  Physical Exam  GENERAL:  81 y.o.-year-old patient lying in the bed with no acute distress.  LUNGS: Normal breath sounds bilaterally, no wheezing, rales,rhonchi or crepitation. No use of accessory muscles of respiration.  CARDIOVASCULAR: S1, S2 normal. No  murmurs, rubs, or gallops.  ABDOMEN: Soft, non-tender, non-distended. Bowel sounds present. No organomegaly or mass.  NEUROLOGIC: Moves all 4 extremities. PSYCHIATRIC: The patient is alert and oriented x 3.  SKIN: No obvious rash, lesion, or ulcer.   DATA REVIEW:   CBC  Recent Labs Lab 10/17/14 0420  WBC 6.2  HGB 10.1*  HCT 31.0*  PLT 176    Chemistries   Recent Labs Lab 10/16/14 2246 10/17/14 0420  NA 134* 136  K 4.5 4.2  CL 98* 99*  CO2 22 26  GLUCOSE 119* 109*  BUN 43* 43*  CREATININE 2.30* 2.27*  CALCIUM 8.9 8.6*  AST 49*  --   ALT 34  --   ALKPHOS 87  --   BILITOT 0.9  --     Cardiac Enzymes  Recent Labs Lab 10/17/14 0844  TROPONINI 0.09*    Microbiology Results  No results found for this or any previous  visit.  RADIOLOGY:  Dg Abd Acute W/chest  10/16/2014   CLINICAL DATA:  Subacute onset of shortness of breath and nausea. Lower abdominal pain. Nonproductive cough. Initial encounter.  EXAM: DG ABDOMEN ACUTE W/ 1V CHEST  COMPARISON:  Chest radiograph performed 05/05/2010, and CT of the abdomen and pelvis performed 10/23/2009  FINDINGS: The lungs are well-aerated. Small bilateral pleural effusions are seen, with bibasilar airspace opacities, likely reflecting atelectasis. Underlying peribronchial thickening and mild vascular congestion are seen. No pneumothorax is identified. The cardiomediastinal silhouette is mildly enlarged. The patient is status post median sternotomy. A pacemaker/AICD is noted overlying the right chest wall, with leads ending overlying the right atrium and right ventricle.  The visualized bowel gas pattern is unremarkable. Scattered stool and air are seen within the colon; there is no evidence of small bowel dilatation to suggest obstruction. No free intra-abdominal air is identified on the provided upright view.  No acute osseous abnormalities are seen; the sacroiliac joints are unremarkable in appearance. Right convex lumbar scoliosis is  noted.  IMPRESSION: 1. Unremarkable bowel gas pattern; no free intra-abdominal air seen. Small amount of stool noted in the colon. 2. Small bilateral pleural effusions, with bibasilar airspace opacities, likely reflecting atelectasis. Underlying peribronchial thickening and mild vascular congestion seen. Mild cardiomegaly noted. 3. Right convex lumbar scoliosis noted.   Electronically Signed   By: Garald Balding M.D.   On: 10/16/2014 22:32      Follow up with PCP in 1 week.  Management plans discussed with the patient, family and they are in agreement.  CODE STATUS:  Advance Directive Documentation        Most Recent Value   Type of Advance Directive  Living will   Pre-existing out of facility DNR order (yellow form or pink MOST form)     "MOST" Form in Place?        TOTAL TIME TAKING CARE OF THIS PATIENT ON DAY OF DISCHARGE: more than 30 minutes.    Hillary Bow R M.D on 10/18/2014 at 12:53 PM  Between 7am to 6pm - Pager - [phone]  After 6pm go to [email or web address] - password [email or web address] [facility]  Office  [phone]  CC: Primary care physician; Kirk Ruths., MD

## 2014-10-19 ENCOUNTER — Other Ambulatory Visit: Payer: Self-pay | Admitting: Radiology

## 2014-10-21 ENCOUNTER — Inpatient Hospital Stay
Admission: EM | Admit: 2014-10-21 | Discharge: 2014-10-25 | DRG: 292 | Disposition: A | Discharge status: Skilled Nursing Facility | Payer: Medicare Other | Attending: Internal Medicine | Admitting: Internal Medicine

## 2014-10-21 ENCOUNTER — Encounter: Payer: Self-pay | Admitting: Emergency Medicine

## 2014-10-21 ENCOUNTER — Emergency Department: Payer: Medicare Other

## 2014-10-21 DIAGNOSIS — R809 Proteinuria, unspecified: Secondary | ICD-10-CM | POA: Diagnosis present

## 2014-10-21 DIAGNOSIS — I482 Chronic atrial fibrillation: Secondary | ICD-10-CM | POA: Diagnosis present

## 2014-10-21 DIAGNOSIS — Z7982 Long term (current) use of aspirin: Secondary | ICD-10-CM

## 2014-10-21 DIAGNOSIS — J449 Chronic obstructive pulmonary disease, unspecified: Secondary | ICD-10-CM | POA: Diagnosis present

## 2014-10-21 DIAGNOSIS — E876 Hypokalemia: Secondary | ICD-10-CM | POA: Diagnosis present

## 2014-10-21 DIAGNOSIS — K649 Unspecified hemorrhoids: Secondary | ICD-10-CM | POA: Diagnosis present

## 2014-10-21 DIAGNOSIS — Z7901 Long term (current) use of anticoagulants: Secondary | ICD-10-CM | POA: Diagnosis not present

## 2014-10-21 DIAGNOSIS — Z8249 Family history of ischemic heart disease and other diseases of the circulatory system: Secondary | ICD-10-CM | POA: Diagnosis not present

## 2014-10-21 DIAGNOSIS — I2510 Atherosclerotic heart disease of native coronary artery without angina pectoris: Secondary | ICD-10-CM | POA: Diagnosis present

## 2014-10-21 DIAGNOSIS — I5022 Chronic systolic (congestive) heart failure: Secondary | ICD-10-CM

## 2014-10-21 DIAGNOSIS — Z880 Allergy status to penicillin: Secondary | ICD-10-CM

## 2014-10-21 DIAGNOSIS — K5900 Constipation, unspecified: Secondary | ICD-10-CM | POA: Diagnosis present

## 2014-10-21 DIAGNOSIS — K219 Gastro-esophageal reflux disease without esophagitis: Secondary | ICD-10-CM | POA: Diagnosis present

## 2014-10-21 DIAGNOSIS — R109 Unspecified abdominal pain: Secondary | ICD-10-CM

## 2014-10-21 DIAGNOSIS — N189 Chronic kidney disease, unspecified: Secondary | ICD-10-CM | POA: Diagnosis present

## 2014-10-21 DIAGNOSIS — N179 Acute kidney failure, unspecified: Secondary | ICD-10-CM | POA: Diagnosis present

## 2014-10-21 DIAGNOSIS — E860 Dehydration: Secondary | ICD-10-CM | POA: Diagnosis present

## 2014-10-21 DIAGNOSIS — Z95810 Presence of automatic (implantable) cardiac defibrillator: Secondary | ICD-10-CM

## 2014-10-21 DIAGNOSIS — J90 Pleural effusion, not elsewhere classified: Secondary | ICD-10-CM

## 2014-10-21 DIAGNOSIS — E875 Hyperkalemia: Secondary | ICD-10-CM | POA: Diagnosis present

## 2014-10-21 DIAGNOSIS — I248 Other forms of acute ischemic heart disease: Secondary | ICD-10-CM | POA: Diagnosis present

## 2014-10-21 DIAGNOSIS — Z9889 Other specified postprocedural states: Secondary | ICD-10-CM

## 2014-10-21 DIAGNOSIS — Z825 Family history of asthma and other chronic lower respiratory diseases: Secondary | ICD-10-CM

## 2014-10-21 DIAGNOSIS — Z801 Family history of malignant neoplasm of trachea, bronchus and lung: Secondary | ICD-10-CM | POA: Diagnosis not present

## 2014-10-21 DIAGNOSIS — Z79899 Other long term (current) drug therapy: Secondary | ICD-10-CM

## 2014-10-21 DIAGNOSIS — Z9012 Acquired absence of left breast and nipple: Secondary | ICD-10-CM | POA: Diagnosis present

## 2014-10-21 DIAGNOSIS — R932 Abnormal findings on diagnostic imaging of liver and biliary tract: Secondary | ICD-10-CM

## 2014-10-21 DIAGNOSIS — E785 Hyperlipidemia, unspecified: Secondary | ICD-10-CM | POA: Diagnosis present

## 2014-10-21 DIAGNOSIS — E871 Hypo-osmolality and hyponatremia: Secondary | ICD-10-CM | POA: Diagnosis present

## 2014-10-21 DIAGNOSIS — I5023 Acute on chronic systolic (congestive) heart failure: Secondary | ICD-10-CM | POA: Diagnosis present

## 2014-10-21 DIAGNOSIS — Z888 Allergy status to other drugs, medicaments and biological substances status: Secondary | ICD-10-CM | POA: Diagnosis not present

## 2014-10-21 DIAGNOSIS — I272 Other secondary pulmonary hypertension: Secondary | ICD-10-CM | POA: Diagnosis present

## 2014-10-21 DIAGNOSIS — I255 Ischemic cardiomyopathy: Secondary | ICD-10-CM | POA: Diagnosis present

## 2014-10-21 DIAGNOSIS — Z951 Presence of aortocoronary bypass graft: Secondary | ICD-10-CM | POA: Diagnosis not present

## 2014-10-21 DIAGNOSIS — N184 Chronic kidney disease, stage 4 (severe): Secondary | ICD-10-CM | POA: Diagnosis present

## 2014-10-21 DIAGNOSIS — R740 Nonspecific elevation of levels of transaminase and lactic acid dehydrogenase [LDH]: Secondary | ICD-10-CM | POA: Diagnosis present

## 2014-10-21 DIAGNOSIS — R7989 Other specified abnormal findings of blood chemistry: Secondary | ICD-10-CM | POA: Diagnosis present

## 2014-10-21 DIAGNOSIS — R1084 Generalized abdominal pain: Secondary | ICD-10-CM

## 2014-10-21 DIAGNOSIS — R0602 Shortness of breath: Secondary | ICD-10-CM

## 2014-10-21 LAB — URINALYSIS COMPLETE WITH MICROSCOPIC (ARMC ONLY)
Bilirubin Urine: NEGATIVE
GLUCOSE, UA: NEGATIVE mg/dL
Hgb urine dipstick: NEGATIVE
Ketones, ur: NEGATIVE mg/dL
Nitrite: NEGATIVE
Protein, ur: 100 mg/dL — AB
SPECIFIC GRAVITY, URINE: 1.014 (ref 1.005–1.030)
TRANS EPITHEL UA: 1
pH: 5 (ref 5.0–8.0)

## 2014-10-21 LAB — MAGNESIUM: Magnesium: 2.4 mg/dL (ref 1.7–2.4)

## 2014-10-21 LAB — CBC
HCT: 34.8 % — ABNORMAL LOW (ref 35.0–47.0)
HEMOGLOBIN: 11.2 g/dL — AB (ref 12.0–16.0)
MCH: 26.8 pg (ref 26.0–34.0)
MCHC: 32.2 g/dL (ref 32.0–36.0)
MCV: 83.3 fL (ref 80.0–100.0)
PLATELETS: 240 10*3/uL (ref 150–440)
RBC: 4.18 MIL/uL (ref 3.80–5.20)
RDW: 15.5 % — ABNORMAL HIGH (ref 11.5–14.5)
WBC: 9.6 10*3/uL (ref 3.6–11.0)

## 2014-10-21 LAB — COMPREHENSIVE METABOLIC PANEL
ALT: 159 U/L — AB (ref 14–54)
ANION GAP: 16 — AB (ref 5–15)
AST: 277 U/L — ABNORMAL HIGH (ref 15–41)
Albumin: 3.8 g/dL (ref 3.5–5.0)
Alkaline Phosphatase: 130 U/L — ABNORMAL HIGH (ref 38–126)
BUN: 62 mg/dL — AB (ref 6–20)
CHLORIDE: 95 mmol/L — AB (ref 101–111)
CO2: 21 mmol/L — ABNORMAL LOW (ref 22–32)
Calcium: 9.2 mg/dL (ref 8.9–10.3)
Creatinine, Ser: 2.96 mg/dL — ABNORMAL HIGH (ref 0.44–1.00)
GFR calc Af Amer: 16 mL/min — ABNORMAL LOW (ref >60)
GFR, EST NON AFRICAN AMERICAN: 14 mL/min — AB (ref >60)
Glucose, Bld: 138 mg/dL — ABNORMAL HIGH (ref 65–99)
POTASSIUM: 5.7 mmol/L — AB (ref 3.5–5.1)
Sodium: 132 mmol/L — ABNORMAL LOW (ref 135–145)
TOTAL PROTEIN: 7.2 g/dL (ref 6.5–8.1)
Total Bilirubin: 1.9 mg/dL — ABNORMAL HIGH (ref 0.3–1.2)

## 2014-10-21 LAB — BASIC METABOLIC PANEL
Anion gap: 15 (ref 5–15)
BUN: 62 mg/dL — ABNORMAL HIGH (ref 6–20)
CHLORIDE: 99 mmol/L — AB (ref 101–111)
CO2: 18 mmol/L — AB (ref 22–32)
CREATININE: 3.19 mg/dL — AB (ref 0.44–1.00)
Calcium: 9.1 mg/dL (ref 8.9–10.3)
GFR calc Af Amer: 15 mL/min — ABNORMAL LOW (ref >60)
GFR calc non Af Amer: 13 mL/min — ABNORMAL LOW (ref >60)
Glucose, Bld: 111 mg/dL — ABNORMAL HIGH (ref 65–99)
Potassium: 5.8 mmol/L — ABNORMAL HIGH (ref 3.5–5.1)
SODIUM: 132 mmol/L — AB (ref 135–145)

## 2014-10-21 LAB — PROTIME-INR
INR: 3
Prothrombin Time: 31.2 seconds — ABNORMAL HIGH (ref 11.4–15.0)

## 2014-10-21 LAB — TROPONIN I: Troponin I: 0.07 ng/mL — ABNORMAL HIGH (ref <0.031)

## 2014-10-21 MED ORDER — ONDANSETRON HCL 4 MG PO TABS: 4.0000 mg | ORAL_TABLET | Freq: Four times a day (QID) | ORAL | Status: DC | PRN

## 2014-10-21 MED ORDER — ACETAMINOPHEN 325 MG PO TABS: 650.0000 mg | ORAL_TABLET | Freq: Four times a day (QID) | ORAL | Status: DC | PRN

## 2014-10-21 MED ORDER — ACETAMINOPHEN 650 MG RE SUPP: 650.0000 mg | Freq: Four times a day (QID) | RECTAL | Status: DC | PRN

## 2014-10-21 MED ORDER — SODIUM POLYSTYRENE SULFONATE 15 GM/60ML PO SUSP
30.0000 g | ORAL | Status: AC
  Administered 2014-10-21: 30 g via ORAL
  Filled 2014-10-21: qty 120

## 2014-10-21 MED ORDER — ONDANSETRON HCL 4 MG/2ML IJ SOLN: 4.0000 mg | Freq: Four times a day (QID) | INTRAMUSCULAR | Status: DC | PRN

## 2014-10-21 MED ORDER — ONDANSETRON HCL 4 MG/2ML IJ SOLN
4.0000 mg | Freq: Once | INTRAMUSCULAR | Status: AC
  Administered 2014-10-21: 4 mg via INTRAVENOUS
  Filled 2014-10-21: qty 2

## 2014-10-21 MED ORDER — SODIUM CHLORIDE 0.9 % IJ SOLN: 3.0000 mL | INTRAMUSCULAR | Status: DC | PRN

## 2014-10-21 MED ORDER — HEPARIN SODIUM (PORCINE) 5000 UNIT/ML IJ SOLN
5000.0000 [IU] | Freq: Three times a day (TID) | INTRAMUSCULAR | Status: DC
  Administered 2014-10-21 – 2014-10-22 (×2): 5000 [IU] via SUBCUTANEOUS
  Filled 2014-10-21 (×2): qty 1

## 2014-10-21 MED ORDER — SODIUM CHLORIDE 0.9 % IJ SOLN
3.0000 mL | Freq: Two times a day (BID) | INTRAMUSCULAR | Status: DC
  Administered 2014-10-21 – 2014-10-25 (×8): 3 mL via INTRAVENOUS

## 2014-10-21 MED ORDER — SODIUM CHLORIDE 0.9 % IV SOLN
Freq: Once | INTRAVENOUS | Status: AC
  Administered 2014-10-21: 13:00:00 via INTRAVENOUS

## 2014-10-21 MED ORDER — SODIUM CHLORIDE 0.9 % IV SOLN: 250.0000 mL | INTRAVENOUS | Status: DC | PRN

## 2014-10-21 MED ORDER — FUROSEMIDE 10 MG/ML IJ SOLN
40.0000 mg | Freq: Two times a day (BID) | INTRAMUSCULAR | Status: DC
Start: 2014-10-21 — End: 2014-10-25
  Administered 2014-10-22 – 2014-10-25 (×7): 40 mg via INTRAVENOUS
  Filled 2014-10-21 (×7): qty 4

## 2014-10-21 MED ORDER — ALBUTEROL SULFATE (2.5 MG/3ML) 0.083% IN NEBU: 2.5000 mg | INHALATION_SOLUTION | RESPIRATORY_TRACT | Status: DC | PRN

## 2014-10-21 NOTE — ED Notes (Signed)
Pt to ED with c/o increased sob since yesterday, has hx of CHF, states she had to sit her chair last night and unable to lay down due to sob, also c/o lower back pain and hemorrhoids

## 2014-10-21 NOTE — ED Provider Notes (Signed)
[facility] Emergency Department Provider Note     Time seen: ----------------------------------------- 11:48 AM on 10/21/2014 -----------------------------------------    I have reviewed the triage vital signs and the nursing notes.   HISTORY  Chief Complaint Shortness of Breath    HPI Sheryl Hughes is a 81 y.o. female who presents to ER for increased shortness of breath since yesterday. Patient states she has a history of CHF and she had to sit in a chair last night she was unable to lay down due to shortness of breath. Also points to lower back pain and hemorrhoids. Patient was recently admitted to the hospital for same. His is scheduled to have a thoracentesis tomorrow.   Past Medical History  Diagnosis Date  . Atrial fibrillation   . CHF (congestive heart failure)   . Shingles   . CAD (coronary artery disease)   . Pulmonary hypertension   . GERD (gastroesophageal reflux disease)   . CKD (chronic kidney disease)     Patient Active Problem List   Diagnosis Date Noted  . Elevated troponin 10/17/2014  . CAD (coronary artery disease) 10/17/2014  . HLD (hyperlipidemia) 10/17/2014  . A-fib 10/17/2014  . GERD (gastroesophageal reflux disease) 10/17/2014  . Chronic kidney disease (CKD), stage IV (severe) 10/17/2014  . Chronic systolic congestive heart failure 10/17/2014  . Constipation 10/17/2014    Past Surgical History  Procedure Laterality Date  . Mastectomy    . Cardiac catheterization    . Back surgery    . Eye surgery    . Thoracentesis Bilateral   . Pacemaker placement      defibrillator  . Mitral valve repair    . Coronary artery bypass graft      Allergies Biaxin; Flagyl; Penicillins; and Tetracyclines & related  Social History Social History  Substance Use Topics  . Smoking status: Never Smoker   . Smokeless tobacco: None  . Alcohol Use: No    Review of Systems Constitutional: Negative for fever. Eyes: Negative for  visual changes. ENT: Negative for sore throat. Cardiovascular: Negative for chest pain. Respiratory: Positive for shortness of breath Gastrointestinal: Negative for abdominal pain, vomiting and diarrhea. Genitourinary: Negative for dysuria. Musculoskeletal: Positive for low back pain Skin: Negative for rash. Neurological: Negative for headaches, focal weakness or numbness.  10-point ROS otherwise negative.  ____________________________________________   PHYSICAL EXAM:  VITAL SIGNS: ED Triage Vitals  Enc Vitals Group     BP --      Pulse Rate 10/21/14 1048 100     Resp 10/21/14 1048 18     Temp 10/21/14 1048 97.5 F (36.4 C)     Temp Source 10/21/14 1048 Oral     SpO2 10/21/14 1048 99 %     Weight 10/21/14 1048 111 lb (50.349 kg)     Height 10/21/14 1048 5\' 4"  (1.626 m)     Head Cir --      Peak Flow --      Pain Score 10/21/14 1110 2     Pain Loc --      Pain Edu? --      Excl. in [place]? --     Constitutional: Alert and oriented. Well appearing and in no distress. Eyes: Conjunctivae are normal. PERRL. Normal extraocular movements. ENT   Head: Normocephalic and atraumatic.   Nose: No congestion/rhinnorhea.   Mouth/Throat: Dry mucous membranes   Neck: No stridor. Cardiovascular: Normal rate, regular rhythm. Normal and symmetric distal pulses are present in all extremities. No  murmurs, rubs, or gallops. Respiratory: Patient with crackles on the left, not tachypnea Gastrointestinal: Soft and nontender. No distention. No abdominal bruits.  Musculoskeletal: Nontender with normal range of motion in all extremities. No joint effusions.  No lower extremity tenderness nor edema. Neurologic:  Normal speech and language. No gross focal neurologic deficits are appreciated. Speech is normal. No gait instability. Skin:  Skin is warm, dry and intact. No rash noted. Psychiatric: Mood and affect are normal. Speech and behavior are normal. Patient exhibits appropriate insight  and judgment. ____________________________________________  EKG: Interpreted by me. Atrial fibrillation with a rate of 90 bpm, left axis deviation, left bundle branch block.  ____________________________________________  ED COURSE:  Pertinent labs & imaging results that were available during my care of the patient were reviewed by me and considered in my medical decision making (see chart for details). Patient will need cardiac labs and repeat chest x-ray. ____________________________________________    LABS (pertinent positives/negatives)  Labs Reviewed  CBC - Abnormal; Notable for the following:    Hemoglobin 11.2 (*)    HCT 34.8 (*)    RDW 15.5 (*)    All other components within normal limits  COMPREHENSIVE METABOLIC PANEL - Abnormal; Notable for the following:    Sodium 132 (*)    Potassium 5.7 (*)    Chloride 95 (*)    CO2 21 (*)    Glucose, Bld 138 (*)    BUN 62 (*)    Creatinine, Ser 2.96 (*)    AST 277 (*)    ALT 159 (*)    Alkaline Phosphatase 130 (*)    Total Bilirubin 1.9 (*)    GFR calc non Af Amer 14 (*)    GFR calc Af Amer 16 (*)    Anion gap 16 (*)    All other components within normal limits  TROPONIN I - Abnormal; Notable for the following:    Troponin I 0.07 (*)    All other components within normal limits  URINALYSIS COMPLETEWITH MICROSCOPIC ([facility] ONLY) - Abnormal; Notable for the following:    Color, Urine AMBER (*)    APPearance CLOUDY (*)    Protein, ur 100 (*)    Leukocytes, UA TRACE (*)    Bacteria, UA RARE (*)    Squamous Epithelial / LPF 6-30 (*)    All other components within normal limits    RADIOLOGY Images were viewed by me  Chest x-ray IMPRESSION: Moderate bilateral pleural effusions are noted with probable underlying edema or atelectasis. ____________________________________________  FINAL ASSESSMENT AND PLAN  Dyspnea, dehydration, pleural effusions, hyperkalemia  Plan: Patient with labs and imaging as dictated above.  Patient appears to be dehydrated, would benefit from gentle IV hydration, may benefit from thoracentesis done inside the hospital rather than as an outpatient. Troponin is elevated but not significantly compared to prior. She has been started on IV fluids to treat her dehydration as well as hyperkalemia.   Earleen Newport, MD   Earleen Newport, MD 10/21/14 [phone]

## 2014-10-21 NOTE — ED Notes (Signed)
MD at bedside. 

## 2014-10-21 NOTE — H&P (Signed)
Sheryl Hughes at [place] NAME: Sheryl Hughes    MR#:  [id number]  DATE OF BIRTH:  [birth year]  DATE OF ADMISSION:  10/21/2014  PRIMARY CARE PHYSICIAN: Kirk Ruths., MD   REQUESTING/REFERRING PHYSICIAN: Earleen Newport, MD  CHIEF COMPLAINT:   Chief Complaint  Patient presents with  . Shortness of Breath   worsening shortness of breath since last night  HISTORY OF PRESENT ILLNESS:  Sheryl Hughes  is a 81 y.o. female with a known history of chronic systolic CHF with ejection fraction 15%, A. fib, CAD, CKD and pulmonary hypertension. The patient has had the shortness of breath for the past that 3 weeks, which has been worsening since last night. She also complains of orthopnea, nocturnal dyspnea and leg edema. She denies any weight gain. She had history of  pleural effusion and thoracentesis. She was just discharged from the hospital 5 days ago after treatment of dyspnea and elevated troponin. She was found to have bilateral pleural effusion and was scheduled to have thoracentesis tomorrow. Her INR was high at 5 and Coumadin was discontinued for the procedure since that time.  PAST MEDICAL HISTORY:   Past Medical History  Diagnosis Date  . Atrial fibrillation   . CHF (congestive heart failure)   . Shingles   . CAD (coronary artery disease)   . Pulmonary hypertension   . GERD (gastroesophageal reflux disease)   . CKD (chronic kidney disease)     PAST SURGICAL HISTORY:   Past Surgical History  Procedure Laterality Date  . Mastectomy    . Cardiac catheterization    . Back surgery    . Eye surgery    . Thoracentesis Bilateral   . Pacemaker placement      defibrillator  . Mitral valve repair    . Coronary artery bypass graft      SOCIAL HISTORY:   Social History  Substance Use Topics  . Smoking status: Never Smoker   . Smokeless tobacco: Not on file  . Alcohol Use: No    FAMILY HISTORY:   Family History  Problem  Relation Age of Onset  . Heart attack Mother   . Heart attack Father   . COPD Father   . Ulcers Father   . Atrial fibrillation Sister   . Lung cancer Sister   . Heart attack Brother   . Aortic stenosis Brother   . CAD Brother     DRUG ALLERGIES:   Allergies  Allergen Reactions  . Biaxin [Clarithromycin] Other (See Comments)    Numb lips  . Flagyl [Metronidazole] Other (See Comments)    Numb lips  . Penicillins Rash  . Tetracyclines & Related Rash    REVIEW OF SYSTEMS:  CONSTITUTIONAL: No fever, but has generalized weakness.  EYES: No blurred or double vision.  EARS, NOSE, AND THROAT: No tinnitus or ear pain.  RESPIRATORY: Has cough, shortness of breath, no wheezing or hemoptysis.  CARDIOVASCULAR: No chest pain, but has orthopnea, nocturnal dyspnea, and leg edema.  GASTROINTESTINAL: No nausea, vomiting, diarrhea or abdominal pain.  GENITOURINARY: No dysuria, hematuria.  ENDOCRINE: No polyuria, nocturia,  HEMATOLOGY: No anemia, easy bruising or bleeding SKIN: No rash or lesion. MUSCULOSKELETAL: No joint pain or arthritis.   NEUROLOGIC: No tingling, numbness, weakness.  PSYCHIATRY: No anxiety or depression.   MEDICATIONS AT HOME:   Prior to Admission medications   Medication Sig Start Date End Date Taking? Authorizing Provider  acetaminophen (TYLENOL) 325 MG tablet Take  650 mg by mouth every 6 (six) hours as needed.    Historical Provider, MD  aspirin EC 81 MG tablet Take 81 mg by mouth daily.    Historical Provider, MD  atorvastatin (LIPITOR) 20 MG tablet Take 20 mg by mouth daily.    Historical Provider, MD  CALCIUM-VITAMIN D PO Take 1 tablet by mouth daily.    Historical Provider, MD  Cholecalciferol 2000 UNITS TABS Take 1 tablet by mouth daily.    Historical Provider, MD  docusate sodium (COLACE) 100 MG capsule Take 100 mg by mouth 2 (two) times daily.    Historical Provider, MD  dronedarone (MULTAQ) 400 MG tablet Take 400 mg by mouth 2 (two) times daily with a meal.     Historical Provider, MD  furosemide (LASIX) 40 MG tablet Take 40 mg by mouth daily.    Historical Provider, MD  losartan (COZAAR) 25 MG tablet Take 25 mg by mouth daily.    Historical Provider, MD  metoprolol succinate (TOPROL-XL) 25 MG 24 hr tablet Take 25 mg by mouth 2 (two) times daily.    Historical Provider, MD  omeprazole (PRILOSEC) 20 MG capsule Take 20 mg by mouth daily.    Historical Provider, MD  polyethylene glycol (MIRALAX / GLYCOLAX) packet Take 17 g by mouth daily. 10/17/14   Srikar Sudini, MD  polyethylene glycol powder (MIRALAX) powder Take 17 g by mouth daily. 10/16/14   Harvest Dark, MD  potassium chloride (MICRO-K) 10 MEQ CR capsule Take 10 mEq by mouth daily.    Historical Provider, MD  vitamin B-12 (CYANOCOBALAMIN) 1000 MCG tablet Take 1,000 mcg by mouth daily.    Historical Provider, MD  warfarin (COUMADIN) 5 MG tablet Take 5 mg by mouth once a week. Pt takes 1-2 times weekly    Historical Provider, MD      VITAL SIGNS:  Blood pressure 103/75, pulse 96, temperature 97.5 F (36.4 C), temperature source Oral, resp. rate 20, height 5\' 4"  (1.626 m), weight 50.349 kg (111 lb), SpO2 100 %.  PHYSICAL EXAMINATION:  GENERAL:  80 y.o.-year-old patient lying in the bed with no acute distress.  EYES: Pupils equal, round, reactive to light and accommodation. No scleral icterus. Extraocular muscles intact.  HEENT: Head atraumatic, normocephalic. Oropharynx and nasopharynx clear.  NECK:  Supple, positive jugular venous distention. No thyroid enlargement, no tenderness.  LUNGS: Normal breath sounds bilaterally, no wheezing, rales,rhonchi or crepitation. No use of accessory muscles of respiration.  CARDIOVASCULAR: S1, S2 normal. No murmurs, rubs, or gallops.  ABDOMEN: Soft, nontender, nondistended. Bowel sounds present. No organomegaly or mass. No ascites signs. EXTREMITIES: Bilateral lower extremity edema 2+, no cyanosis, or clubbing.  NEUROLOGIC: Cranial nerves II through XII  are intact. Muscle strength 4/5 in all extremities. Sensation intact. Gait not checked.  PSYCHIATRIC: The patient is alert and oriented x 3.  SKIN: No obvious rash, lesion, or ulcer.   LABORATORY PANEL:   CBC  Recent Labs Lab 10/21/14 1116  WBC 9.6  HGB 11.2*  HCT 34.8*  PLT 240   ------------------------------------------------------------------------------------------------------------------  Chemistries   Recent Labs Lab 10/21/14 1116  NA 132*  K 5.7*  CL 95*  CO2 21*  GLUCOSE 138*  BUN 62*  CREATININE 2.96*  CALCIUM 9.2  AST 277*  ALT 159*  ALKPHOS 130*  BILITOT 1.9*   ------------------------------------------------------------------------------------------------------------------  Cardiac Enzymes  Recent Labs Lab 10/21/14 1116  TROPONINI 0.07*   ------------------------------------------------------------------------------------------------------------------  RADIOLOGY:  Dg Chest 2 View  10/21/2014   CLINICAL DATA:  Dyspnea.  EXAM: CHEST  2 VIEW  COMPARISON:  October 16, 2014.  FINDINGS: Stable cardiomegaly. No pneumothorax is noted. Right-sided pacemaker is unchanged in position. Stable moderate bilateral pleural effusions are noted with probable underlying edema or atelectasis. Atherosclerosis of thoracic aorta is noted. No significant osseous abnormality is noted.  IMPRESSION: Moderate bilateral pleural effusions are noted with probable underlying edema or atelectasis.   Electronically Signed   By: Marijo Conception, M.D.   On: 10/21/2014 12:40   US Abdomen Limited Ruq  10/21/2014   CLINICAL DATA:  81 year old female with intermittent abdominal pain, abnormal LFTs. Initial encounter.  EXAM: US ABDOMEN LIMITED - RIGHT UPPER QUADRANT  COMPARISON:  CT Abdomen and Pelvis 10/23/2009.  FINDINGS: Gallbladder:  Generalize gallbladder wall thickening and heterogeneity, measuring up to 5-6 mm (image 15). No echogenic sludge or stones identified. Superimposed  pericholecystic fluid suspected. No sonographic Murphy sign elicited.  Common bile duct:  Diameter: 2 mm, normal  Liver:  No focal lesion identified. Within normal limits in parenchymal echogenicity.  Other findings: Negative visible right kidney.  IMPRESSION: 1. Diffuse at abnormal gallbladder wall thickening. No sludge or stones identified. Top differential considerations include acute acalculous cholecystitis, and chronic cholecystitis. Nuclear medicine hepatobiliary scan may be able to differentiate further. 2. No biliary obstruction.   Electronically Signed   By: Genevie Ann M.D.   On: 10/21/2014 16:01    EKG:   Orders placed or performed during the hospital encounter of 10/21/14  . ED EKG  . ED EKG    IMPRESSION AND PLAN:   Bilateral moderate pleural effusion Acute on chronic systolic CHF with ejection fraction 15-20% Acute renal failure on CKD Hyperkalemia Hyponatremia Elevated troponin, possible due to renal failure. Abnormal liver function test A. fib CAD  The patient will be admitted to telemetry floor. I will start CHF protocol,  give Lasix 40 mg IV twice a day, follow-up cardiology consult. For hyperkalemia, the patient will be given Kayexalate, follow-up BMP. Acute renal failure on chronic kidney disease is possibly due to fluid retention secondary to acute on chronic CHF. As mentioned above, I will hold IV fluid and continue Lasix, closely monitor BMP. For A. fib, rate is controlled. I will continue Lopressor and continue to hold Coumadin, follow-up INR and PTT. For bilateral moderate pleural effusion, which is a possibly due to CHF, I will continue Lasix. The patient may need thoracentesis. For abnormal liver function test, follow-up abdominal ultrasound, CMP and GI consult.  All the records are reviewed and case discussed with ED provider. Management plans discussed with the patient, her brother and they are in agreement.  CODE STATUS: Full code  TOTAL TIME TAKING CARE  OF THIS PATIENT: 62 minutes.    Demetrios Loll M.D on 10/21/2014 at 4:13 PM  Between 7am to 6pm - Pager - [phone]  After 6pm go to [email or web address] - password [email or web address] [facility] [place]  Office  [phone]  CC: Primary care physician; Kirk Ruths., MD

## 2014-10-21 NOTE — Progress Notes (Signed)
MD, Dr. Lavetta Nielsen notified due to low BP-- 40 mg IV lasix ordered to give at 2000.  MD ordered to hold dose, continue to monitor.  Jessee Avers

## 2014-10-21 NOTE — ED Notes (Signed)
Patient transported to Ultrasound 

## 2014-10-22 ENCOUNTER — Inpatient Hospital Stay: Payer: Medicare Other

## 2014-10-22 ENCOUNTER — Ambulatory Visit: Admission: RE | Admit: 2014-10-22 | Payer: Medicare Other | Source: Ambulatory Visit

## 2014-10-22 ENCOUNTER — Ambulatory Visit
Admission: RE | Admit: 2014-10-22 | Discharge: 2014-10-22 | Disposition: A | Discharge status: Home / Self Care | Payer: Medicare Other | Source: Ambulatory Visit | Attending: Specialist | Admitting: Specialist

## 2014-10-22 ENCOUNTER — Encounter: Payer: Self-pay | Admitting: Gastroenterology

## 2014-10-22 LAB — LACTATE DEHYDROGENASE, PLEURAL OR PERITONEAL FLUID: LD FL: 73 U/L — AB (ref 3–23)

## 2014-10-22 LAB — COMPREHENSIVE METABOLIC PANEL
ALBUMIN: 3.1 g/dL — AB (ref 3.5–5.0)
ALK PHOS: 132 U/L — AB (ref 38–126)
ALT: 226 U/L — AB (ref 14–54)
ANION GAP: 12 (ref 5–15)
AST: 370 U/L — ABNORMAL HIGH (ref 15–41)
BUN: 69 mg/dL — ABNORMAL HIGH (ref 6–20)
CALCIUM: 8.7 mg/dL — AB (ref 8.9–10.3)
CO2: 21 mmol/L — AB (ref 22–32)
Chloride: 101 mmol/L (ref 101–111)
Creatinine, Ser: 3.3 mg/dL — ABNORMAL HIGH (ref 0.44–1.00)
GFR calc Af Amer: 14 mL/min — ABNORMAL LOW (ref >60)
GFR calc non Af Amer: 12 mL/min — ABNORMAL LOW (ref >60)
GLUCOSE: 117 mg/dL — AB (ref 65–99)
Potassium: 4.5 mmol/L (ref 3.5–5.1)
SODIUM: 134 mmol/L — AB (ref 135–145)
Total Bilirubin: 1.5 mg/dL — ABNORMAL HIGH (ref 0.3–1.2)
Total Protein: 5.5 g/dL — ABNORMAL LOW (ref 6.5–8.1)

## 2014-10-22 LAB — CBC
HCT: 32.4 % — ABNORMAL LOW (ref 35.0–47.0)
HEMOGLOBIN: 10.5 g/dL — AB (ref 12.0–16.0)
MCH: 26.8 pg (ref 26.0–34.0)
MCHC: 32.3 g/dL (ref 32.0–36.0)
MCV: 82.8 fL (ref 80.0–100.0)
Platelets: 199 10*3/uL (ref 150–440)
RBC: 3.91 MIL/uL (ref 3.80–5.20)
RDW: 15.4 % — ABNORMAL HIGH (ref 11.5–14.5)
WBC: 9.3 10*3/uL (ref 3.6–11.0)

## 2014-10-22 LAB — PROTIME-INR
INR: 2.85
Prothrombin Time: 30 seconds — ABNORMAL HIGH (ref 11.4–15.0)

## 2014-10-22 LAB — TRIGLYCERIDES: TRIGLYCERIDES: 78 mg/dL (ref <150)

## 2014-10-22 LAB — CHOLESTEROL, TOTAL: Cholesterol: 90 mg/dL (ref 0–200)

## 2014-10-22 LAB — AMYLASE: Amylase: 53 U/L (ref 28–100)

## 2014-10-22 LAB — APTT: aPTT: 35 seconds (ref 24–36)

## 2014-10-22 LAB — BODY FLUID CELL COUNT WITH DIFFERENTIAL
EOS FL: 0 %
Lymphs, Fluid: 36 %
MONOCYTE-MACROPHAGE-SEROUS FLUID: 4 %
Neutrophil Count, Fluid: 60 % — ABNORMAL HIGH (ref 0–25)
OTHER CELLS FL: 0 %
WBC FLUID: 43 uL

## 2014-10-22 LAB — PROTEIN, BODY FLUID: Total protein, fluid: <3 g/dL

## 2014-10-22 LAB — GLUCOSE, SEROUS FLUID: GLUCOSE FL: 119 mg/dL

## 2014-10-22 MED ORDER — PANTOPRAZOLE SODIUM 40 MG PO TBEC
40.0000 mg | DELAYED_RELEASE_TABLET | Freq: Two times a day (BID) | ORAL | Status: DC
  Administered 2014-10-22 – 2014-10-25 (×6): 40 mg via ORAL
  Filled 2014-10-22 (×6): qty 1

## 2014-10-22 MED ORDER — WARFARIN SODIUM 5 MG PO TABS: 2.5000 mg | ORAL_TABLET | ORAL | Status: DC

## 2014-10-22 MED ORDER — WARFARIN - PHARMACIST DOSING INPATIENT
Freq: Every day | Status: DC
Start: 2014-10-22 — End: 2014-10-25
  Administered 2014-10-23: 18:00:00

## 2014-10-22 MED ORDER — WARFARIN SODIUM 5 MG PO TABS
2.5000 mg | ORAL_TABLET | Freq: Once | ORAL | Status: AC
  Filled 2014-10-22: qty 1

## 2014-10-22 MED ORDER — WARFARIN SODIUM 5 MG PO TABS: 5.0000 mg | ORAL_TABLET | ORAL | Status: DC

## 2014-10-22 MED ORDER — TECHNETIUM TC 99M MEBROFENIN IV KIT
5.4300 | PACK | Freq: Once | INTRAVENOUS | Status: DC | PRN
  Administered 2014-10-22: 5.43 via INTRAVENOUS
  Filled 2014-10-22: qty 6

## 2014-10-22 MED ORDER — PANTOPRAZOLE SODIUM 40 MG IV SOLR: 40.0000 mg | Freq: Two times a day (BID) | INTRAVENOUS | Status: DC

## 2014-10-22 NOTE — Progress Notes (Signed)
MD notified:  Dr. Manuella Ghazi notified that patient's bp is 99/63. Patient reports this is what her bp normally runs. Dr. Manuella Ghazi said give lasix unless systolic bp is 80 or less.

## 2014-10-22 NOTE — Progress Notes (Signed)
Date: 10/22/2014,   MRN# [id number] Sheryl Hughes February 15, 1932 Code Status:     Code Status Orders        Start     Ordered   10/21/14 1820  Full code   Continuous     10/21/14 1819    Advance Directive Documentation        Most Recent Value   Type of Advance Directive  Living will   Pre-existing out of facility DNR order (yellow form or pink MOST form)     "MOST" Form in Place?       Hosp day:@LENGTHOFSTAYDAYS @ Referring MD: @ATDPROV @         AdmissionWeight: 111 lb (50.349 kg)                 CurrentWeight: 112 lb (50.803 kg)  CC:  Bilateral pleural effusion  HPI: This is an 81 year old lady, here with increase sob,  EF 15 %, afib, cad, CDK, bilateral pleural effusions. Chronic sob, last pm came in  with worsening sob, admitted, pulmonary consulted. She is a patient of mine. She has had thoracentesis before. Presently down stairs for thoracentesis. Discussed with Dr. Manuella Ghazi and her daughter.   PMHX:   Past Medical History  Diagnosis Date  . Atrial fibrillation   . CHF (congestive heart failure)   . Shingles   . CAD (coronary artery disease)   . Pulmonary hypertension   . GERD (gastroesophageal reflux disease)   . CKD (chronic kidney disease)    Surgical Hx:  Past Surgical History  Procedure Laterality Date  . Mastectomy    . Cardiac catheterization    . Back surgery    . Eye surgery    . Thoracentesis Bilateral   . Pacemaker placement      defibrillator  . Mitral valve repair    . Coronary artery bypass graft     Family Hx:  Family History  Problem Relation Age of Onset  . Heart attack Mother   . Heart attack Father   . COPD Father   . Ulcers Father   . Atrial fibrillation Sister   . Lung cancer Sister   . Heart attack Brother   . Aortic stenosis Brother   . CAD Brother    Social Hx:   Social History  Substance Use Topics  . Smoking status: Never Smoker   . Smokeless tobacco: None  . Alcohol Use: No   Medication:    Home Medication:  No current  outpatient prescriptions on file.  Current Medication: @CURMEDTAB @   Allergies:  Biaxin; Flagyl; Penicillins; and Tetracyclines & related  Review of Systems: per daughter Gen:  Denies  fever, sweats, chills HEENT: Denies blurred vision, double vision, ear pain, eye pain, hearing loss, nose bleeds, sore throat Cvc:  No dizziness, chest pain or heaviness Resp: no hemoptysis, increase sob, no cough, wheezing    Gi: Denies swallowing difficulty, stomach pain, nausea or vomiting, diarrhea, constipation, bowel incontinence Gu:  Denies bladder incontinence, burning urine Ext:   No Joint pain, stiffness or swelling Skin: No skin rash, easy bruising or bleeding or hives Endoc:  No polyuria, polydipsia , polyphagia or weight change Psych: No depression, insomnia or hallucinations  Other:  All other systems negative  Physical Examination:   VS: BP 97/59 mmHg  Pulse 105  Temp(Src) 97.5 F (36.4 C) (Oral)  Resp 18  Ht 5\' 4"  (1.626 m)  Wt 112 lb (50.803 kg)  BMI 19.22 kg/m2  SpO2 95%  General  Appearance: No distress  Neuro: without focal findings, mental status, speech normal, alert and oriented, cranial nerves 2-12 intact, reflexes normal and symmetric, sensation grossly normal  HEENT: PERRLA, EOM intact, no ptosis, no other lesions noticed, Mallampati: Pulmonary:.No wheezing, No rales  Decrease bs at the bases  Cardiovascular:  Normal S1,S2.  No m/r/g.  Abdominal aorta pulsation normal.    Abdomen:Benign, Soft, non-tender, No masses, hepatosplenomegaly, No lymphadenopathy Endoc: No evident thyromegaly, no signs of acromegaly or Cushing features Skin:   warm, no rashes, no ecchymosis  Extremities: normal, no cyanosis, clubbing, no edema, warm with normal capillary refill. Other findings:   Labs results:   Recent Labs     10/21/14  1116  10/21/14  1842  10/21/14  2027  10/22/14  0543  HGB  11.2*   --    --   10.5*  HCT  34.8*   --    --   32.4*  MCV  83.3   --    --   82.8  WBC   9.6   --    --   9.3  BUN  62*  62*   --   69*  CREATININE  2.96*  3.19*   --   3.30*  GLUCOSE  138*  111*   --   117*  CALCIUM  9.2  9.1   --   8.7*  INR   --    --   3.00  2.85  ,    Culture results:     Rad results:   Dg Chest 1 View  10/22/2014   CLINICAL DATA:  Post thoracentesis.  EXAM: CHEST  1 VIEW  COMPARISON:  10/21/2014  FINDINGS: Improved aeration at the left lung base compatible with recent thoracentesis. No evidence for a pneumothorax. Again noted is enlargement of the heart with median sternotomy wires and cardiac ICD leads. Persistent densities at the right lung base are compatible with right pleural fluid and consolidation/atelectasis.  IMPRESSION: Improved aeration in the left lung following a left thoracentesis. Negative for pneumothorax.  Persistent right basilar densities are compatible with pleural fluid and consolidation/volume loss.   Electronically Signed   By: Markus Daft M.D.   On: 10/22/2014 15:07   US Thoracentesis Asp Pleural Space W/img Guide  10/22/2014   CLINICAL DATA:  Bilateral pleural effusions  EXAM: ULTRASOUND GUIDED left THORACENTESIS  PROCEDURE: An ultrasound guided thoracentesis was thoroughly discussed with the patient and questions answered. The benefits, risks, alternatives and complications were also discussed. The patient understands and wishes to proceed with the procedure. Written consent was obtained.  Ultrasound was performed to localize and mark an adequate pocket of fluid in the left chest. The area was then prepped and draped in the normal sterile fashion. 1% Lidocaine was used for local anesthesia. Under ultrasound guidance a 6 French thoracentesis catheter was introduced. Thoracentesis was performed. The catheter was removed and a dressing applied.  COMPLICATIONS: None  FINDINGS: A total of approximately 500 mL of clear yellow fluid was removed. A fluid sample was sent for laboratory analysis.  IMPRESSION: Successful ultrasound guided left  thoracentesis yielding 500 mL of pleural fluid.   Electronically Signed   By: Inez Catalina M.D.   On: 10/22/2014 16:00      Assessment and Plan: Dyspnea on exertion: Piute III, due to cardiomyopathy, EF 15-20 %, bilateral pleural effusions (most likely cardiac in origin), +/- deconditioned, Diaphragm not paralyzed. Elevated LFTS, ? Passive congestion, ? Liver dz, gallbladder etc.  Plan: -continue watching salt, fluid intake -diuretics -following cardiology recs -therapeutic left thoracentesis (done) -send fluid for LDH, t,prot, gluc, cell count with diff, cytology, culture -gi eval in progress -further orders per above  Cardiomyopathy, ef 15 % -following cardiology recs  Acute on chronic renal desase -serial bmp -cautious diuresis -avoiding nephro toxic meds     I have personally obtained a history, examined the patient, evaluated laboratory and imaging results, formulated the assessment and plan and placed orders.  The Patient requires high complexity decision making for assessment and support, frequent evaluation and titration of therapies, application of advanced monitoring technologies and extensive interpretation of multiple databases.   Herbon Fleming,M.D. Pulmonary & Critical care Medicine [facility]

## 2014-10-22 NOTE — Progress Notes (Signed)
PT Cancellation Note  Patient Details Name: Sheryl Hughes MRN: [id number] DOB: 12/11/1931   Cancelled Treatment:    Reason Eval/Treat Not Completed: Patient at procedure or test/unavailable. Patient is not in the room currently for testing. PT will re-attempt mobility evaluation when patient is available and appropriate.   Kerman Passey, PT, DPT    10/22/2014, 2:13 PM

## 2014-10-22 NOTE — Care Management Note (Signed)
Case Management Note  Patient Details  Name: [name] MRN: [id number] Date of Birth: 1931-05-03  Subjective/Objective:    Dr Manuella Ghazi has ordered a PT consult per daughter is stating that [name] may need SNF/rehab after discharge. [name] currently resides with her husband who has dementia and is unable to provide assistance at home.                 Action/Plan:   Expected Discharge Date:                  Expected Discharge Plan:     In-House Referral:     Discharge planning Services     Post Acute Care Choice:    Choice offered to:     DME Arranged:    DME Agency:     HH Arranged:    [place] Agency:     Status of Service:     Medicare Important Message Given:  Yes-second notification given Date Medicare IM Given:    Medicare IM give by:    Date Additional Medicare IM Given:    Additional Medicare Important Message give by:     If discussed at [place] of Stay Meetings, dates discussed:    Additional Comments:  Tal Kempker A, RN 10/22/2014, 11:49 AM

## 2014-10-22 NOTE — Progress Notes (Signed)
[facility] [place] NAME: Sheryl Hughes    MR#:  [id number]  DATE OF BIRTH:  10/09/1931  SUBJECTIVE:  CHIEF COMPLAINT:   Chief Complaint  Patient presents with  . Shortness of Breath  feels somewhat better but has lots of belching and gas. All family at bedside concerned. Would like her to go to rehab REVIEW OF SYSTEMS:  Review of Systems  Constitutional: Negative for fever, weight loss, malaise/fatigue and diaphoresis.  HENT: Negative for ear discharge, ear pain, hearing loss, nosebleeds, sore throat and tinnitus.   Eyes: Negative for blurred vision and pain.  Respiratory: Negative for cough, hemoptysis, shortness of breath and wheezing.   Cardiovascular: Negative for chest pain, palpitations, orthopnea and leg swelling.  Gastrointestinal: Positive for nausea. Negative for heartburn, vomiting, abdominal pain, diarrhea, constipation and blood in stool.  Genitourinary: Negative for dysuria, urgency and frequency.  Musculoskeletal: Negative for myalgias and back pain.  Skin: Negative for itching and rash.  Neurological: Negative for dizziness, tingling, tremors, focal weakness, seizures, weakness and headaches.  Psychiatric/Behavioral: Negative for depression. The patient is not nervous/anxious.    DRUG ALLERGIES:   Allergies  Allergen Reactions  . Biaxin [Clarithromycin] Other (See Comments)    Numb lips  . Flagyl [Metronidazole] Other (See Comments)    Numb lips  . Penicillins Rash  . Tetracyclines & Related Rash   VITALS:  Blood pressure 99/63, pulse 106, temperature 98.1 F (36.7 C), temperature source Oral, resp. rate 18, height 5\' 4"  (1.626 m), weight 50.803 kg (112 lb), SpO2 99 %. PHYSICAL EXAMINATION:  Physical Exam  Constitutional: She is oriented to person, place, and time and well-developed, well-nourished, and in no distress.  HENT:  Head: Normocephalic and atraumatic.  Eyes: Conjunctivae and EOM are normal.  Pupils are equal, round, and reactive to light.  Neck: Normal range of motion. Neck supple. No tracheal deviation present. No thyromegaly present.  Cardiovascular: Normal rate, regular rhythm and normal heart sounds.   Pulmonary/Chest: Effort normal and breath sounds normal. No respiratory distress. She has no wheezes. She exhibits no tenderness.  Abdominal: Soft. Bowel sounds are normal. She exhibits no distension. There is no tenderness.  Musculoskeletal: Normal range of motion.  Neurological: She is alert and oriented to person, place, and time. No cranial nerve deficit.  Skin: Skin is warm and dry. No rash noted.  Psychiatric: Mood and affect normal.    LABORATORY PANEL:   CBC  Recent Labs Lab 10/22/14 0543  WBC 9.3  HGB 10.5*  HCT 32.4*  PLT 199   ------------------------------------------------------------------------------------------------------------------ Chemistries   Recent Labs Lab 10/21/14 1842 10/22/14 0543  NA 132* 134*  K 5.8* 4.5  CL 99* 101  CO2 18* 21*  GLUCOSE 111* 117*  BUN 62* 69*  CREATININE 3.19* 3.30*  CALCIUM 9.1 8.7*  MG 2.4  --   AST  --  370*  ALT  --  226*  ALKPHOS  --  132*  BILITOT  --  1.5*   RADIOLOGY:  Dg Chest 2 View  10/21/2014   CLINICAL DATA:  Dyspnea.  EXAM: CHEST  2 VIEW  COMPARISON:  October 16, 2014.  FINDINGS: Stable cardiomegaly. No pneumothorax is noted. Right-sided pacemaker is unchanged in position. Stable moderate bilateral pleural effusions are noted with probable underlying edema or atelectasis. Atherosclerosis of thoracic aorta is noted. No significant osseous abnormality is noted.  IMPRESSION: Moderate bilateral pleural effusions are noted with probable underlying edema or atelectasis.  Electronically Signed   By: Marijo Conception, M.D.   On: 10/21/2014 12:40   US Abdomen Limited Ruq  10/21/2014   CLINICAL DATA:  81 year old female with intermittent abdominal pain, abnormal LFTs. Initial encounter.  EXAM: US ABDOMEN  LIMITED - RIGHT UPPER QUADRANT  COMPARISON:  CT Abdomen and Pelvis 10/23/2009.  FINDINGS: Gallbladder:  Generalize gallbladder wall thickening and heterogeneity, measuring up to 5-6 mm (image 15). No echogenic sludge or stones identified. Superimposed pericholecystic fluid suspected. No sonographic Murphy sign elicited.  Common bile duct:  Diameter: 2 mm, normal  Liver:  No focal lesion identified. Within normal limits in parenchymal echogenicity.  Other findings: Negative visible right kidney.  IMPRESSION: 1. Diffuse at abnormal gallbladder wall thickening. No sludge or stones identified. Top differential considerations include acute acalculous cholecystitis, and chronic cholecystitis. Nuclear medicine hepatobiliary scan may be able to differentiate further. 2. No biliary obstruction.   Electronically Signed   By: Genevie Ann M.D.   On: 10/21/2014 16:01   ASSESSMENT AND PLAN:   * Bilateral moderate pleural effusion: will get Pulmo and cardio c/s. ? Will order thoracentesis (as she was scheduled as an outpt anyway by her pulmo)  * Acute on chronic systolic CHF with ejection fraction 15-20%: continue IV lasix, she is neg 80 cc thus far. Follows with Dr Saralyn Pilar, pending c/s  * Acute renal failure on CKD 4: could be due to cardio-renal syndrome, c/s nephro  * Hyperkalemia/Hyponatremia: Improving, given kayexalate once  * Elevated troponin: due to supply demand ischemia, due to renal failure. Monitor on tele, cardio c/s  * Abnormal liver function test: monitor, Abd US shows Diffuse gallbladder wall thickening worrisome for acute acalculous cholecystitis, and chronic cholecystitis. Will get HIDA scan. GI c/s pending.  * A. Fib: rate is controlled. continue Lopressor and continue to hold Coumadin, follow-up INR and PTT, pharmacy to manage coumadin   * CAD: monitor on tele, cardio c/s  Will get PT eval - likely benefit from placement. Family is insisting on it.  All the records are reviewed and case  discussed with Care Management/Social Worker. Management plans discussed with the patient, family and they are in agreement.  CODE STATUS: Full Code  TOTAL TIME TAKING CARE OF THIS PATIENT: 35 minutes.   More than 50% of the time was spent in counseling/coordination of care: YES (discussed with family)  POSSIBLE D/C IN 2-3 DAYS, DEPENDING ON CLINICAL CONDITION.   [facility], Carlo Guevarra M.D on 10/22/2014 at 11:40 AM  Between 7am to 6pm - Pager - [phone]  After 6pm go to [email or web address] - password [email or web address] [facility]  Office  [phone]  CC: Primary care physician; Kirk Ruths., MD

## 2014-10-22 NOTE — Progress Notes (Signed)
Info sent to Dr Doy Hutching to review

## 2014-10-22 NOTE — Progress Notes (Signed)
Dr. Golden Circle notified of elevated PT/INR prior to US guided thoracentesis-pt. Cleared for procedure.

## 2014-10-22 NOTE — Consult Note (Signed)
GI Inpatient Consult Note  Reason for Consult: Abnormal liver function tests   Attending Requesting Consult: Dr. Brigitte Pulse  History of Present Illness: Sheryl Hughes is a 81 y.o. female  who is being treated for shortness of breath, heart failure, AFib.  She reports she has had shortness of breath for the past 3 weeks.   During her normal workup it was discover that her AST was 370, her ALT was 226, and her alk-phos was 132.  Her bilirubin was 1.5.  She had been recently admitted to the hospital with beginning of August and labs drawn on August 9,2016 showed her LFTs within normal limits.  She denies using Tylenol with exception on rare occasions.  She denies any abdominal pain.  Her thyroid level was checked 1 month ago was within normal limits at 2.537.  She does not have a history of alcohol consumption.  She is a patient of [facility] in the GI department, and was scheduled for an office visit tomorrow, August 16 for ongoing GI issues.  She complains of constant belching, excessive bloating, and excessive gas.  She also complains of chronic constipation.  She reports that she will have continuous belching even after drinking a couple of sips of water.  She takes Prilosec 20 mg once in the morning.  At one time it had been suggested to her to take it twice a day, however she reports when she took a second one, with dinner. she became nauseous in the evening. She reports that the bloating and gas become worse throughout the day.  She reports that she will have to make sure that  she passes as much gas as possible prior to beginning her dinner routine, otherwise she reports extreme amounts of gas and bloating in the evening.  She reports that she will take a Tums occasionally with some relief of the gas.  She reports that she takes a stool softener in the morning and one in the evening and still will have to strain to go.  She was started on MiraLax last week after her hospitalization, but reports  that it made her go to often and that she was too weak to get to the bathroom that many times and discontinue using it.  She reports that she will have bilateral lower back pain when she feels constipated, and had generalized overall looking feeling.  She reports the constipation makes it so she is not sleeping well and was she does have a bowel movement that is little hard pellets.  She reports that she has a history of hemorrhoids and has  seen occasional bright red blood on the tissue.  She will use preparation H suppositories when she feels her hemorrhoids are starting to flare up, and reports relief.  She had an upper endoscopy in 2011 that was positive for H pylori.  She did receive partial treatment for the H pylori, had to discontinue her treatment due to allergic reaction to meds and was allergic to other  Choices available.  She did receive a stool H pylori antigen test which showed that the H pylori were had cleared from her system.  She is on Multaq.   Past Medical History:  Past Medical History  Diagnosis Date  . Atrial fibrillation   . CHF (congestive heart failure)   . Shingles   . CAD (coronary artery disease)   . Pulmonary hypertension   . GERD (gastroesophageal reflux disease)   . CKD (chronic kidney disease)  Problem List: Patient Active Problem List   Diagnosis Date Noted  . Bilateral pleural effusion 10/21/2014  . Hyperkalemia 10/21/2014  . Renal failure (ARF), acute on chronic 10/21/2014  . Elevated troponin 10/17/2014  . CAD (coronary artery disease) 10/17/2014  . HLD (hyperlipidemia) 10/17/2014  . A-fib 10/17/2014  . GERD (gastroesophageal reflux disease) 10/17/2014  . Chronic kidney disease (CKD), stage IV (severe) 10/17/2014  . Chronic systolic congestive heart failure 10/17/2014  . Constipation 10/17/2014    Past Surgical History: Past Surgical History  Procedure Laterality Date  . Mastectomy    . Cardiac catheterization    . Back surgery    . Eye  surgery    . Thoracentesis Bilateral   . Pacemaker placement      defibrillator  . Mitral valve repair    . Coronary artery bypass graft      Allergies: Allergies  Allergen Reactions  . Biaxin [Clarithromycin] Other (See Comments)    Numb lips  . Flagyl [Metronidazole] Other (See Comments)    Numb lips  . Penicillins Rash  . Tetracyclines & Related Rash    Home Medications: Prescriptions prior to admission  Medication Sig Dispense Refill Last Dose  . acetaminophen (TYLENOL) 325 MG tablet Take 325 mg by mouth every 6 (six) hours as needed for mild pain, moderate pain or fever.    Past Month at Unknown time  . aspirin EC 81 MG tablet Take 81 mg by mouth daily.   10/16/2014  . atorvastatin (LIPITOR) 20 MG tablet Take 20 mg by mouth at bedtime.    10/20/2014 at Unknown time  . Calcium Carb-Cholecalciferol (CALCIUM 600 + D PO) Take 1 tablet by mouth daily.     . Cholecalciferol 2000 UNITS TABS Take 1 tablet by mouth daily.   10/21/2014 at Unknown time  . docusate sodium (COLACE) 100 MG capsule Take 100 mg by mouth 2 (two) times daily.   10/21/2014 at Unknown time  . dronedarone (MULTAQ) 400 MG tablet Take 400 mg by mouth 2 (two) times daily with a meal.   10/21/2014 at Unknown time  . furosemide (LASIX) 40 MG tablet Take 40 mg by mouth 2 (two) times daily.    10/21/2014 at Unknown time  . metoprolol succinate (TOPROL-XL) 25 MG 24 hr tablet Take 25 mg by mouth 2 (two) times daily.   10/21/2014 at 1000  . omeprazole (PRILOSEC) 20 MG capsule Take 20 mg by mouth daily.   10/20/2014 at Unknown time  . potassium chloride (MICRO-K) 10 MEQ CR capsule Take 10 mEq by mouth 2 (two) times daily.    10/21/2014 at Unknown time  . vitamin B-12 (CYANOCOBALAMIN) 1000 MCG tablet Take 1,000 mcg by mouth daily.   10/21/2014 at Unknown time  . warfarin (COUMADIN) 5 MG tablet Take 2.5-5 mg by mouth See admin instructions. Take 54m tablet orally once a day Monday and Friday. Take 1/2 tablet (2.550m orally on Sunday,  Tuesday, Wednesday, Thursday, and Saturday.   10/16/2014  . polyethylene glycol (MIRALAX / GLYCOLAX) packet Take 17 g by mouth daily. 30 each 0   . polyethylene glycol powder (MIRALAX) powder Take 17 g by mouth daily. 255 g 0    Home medication reconciliation was completed with the patient.   Scheduled Inpatient Medications:   . furosemide  40 mg Intravenous Q12H  . heparin  5,000 Units Subcutaneous 3 times per day  . sodium chloride  3 mL Intravenous Q12H    Continuous Inpatient Infusions:  PRN Inpatient Medications:  sodium chloride, acetaminophen **OR** acetaminophen, albuterol, ondansetron **OR** ondansetron (ZOFRAN) IV, sodium chloride  Family History: family history includes Aortic stenosis in her brother; Atrial fibrillation in her sister; CAD in her brother; COPD in her father; Heart attack in her brother, father, and mother; Lung cancer in her sister; Ulcers in her father.  .  Social History:   reports that she has never smoked. She does not have any smokeless tobacco history on file. She reports that she does not drink alcohol or use illicit drugs.   Review of Systems: Constitutional: Weight is stable.  Eyes: No changes in vision. ENT: No oral lesions, sore throat.  GI: see HPI.  Heme/Lymph: No easy bruising.  CV: No chest pain.  GU: No hematuria.  Integumentary: No rashes.  Neuro: No headaches.  Psych: No depression/anxiety.  Endocrine: No heat/cold intolerance.  Allergic/Immunologic: No urticaria.  Resp: No cough, SOB.  Musculoskeletal: No joint swelling.    Physical Examination: BP 95/63 mmHg  Pulse 103  Temp(Src) 98.1 F (36.7 C) (Oral)  Resp 18  Ht 5' 4" (1.626 m)  Wt 50.803 kg (112 lb)  BMI 19.22 kg/m2  SpO2 100% Gen: NAD, alert and oriented x 4.  Excellent historian,  Daughter was present and contributed. HEENT: PEERLA, EOMI, Neck: supple, JVD noted  Chest: CTA bilaterally, no wheezes, crackles, or other adventitious sounds CV: irregularly  irregular, no m/g/c/r Abd: soft, NT, ND, +BS in all four quadrants; no HSM, guarding, ridigity, or rebound tenderness Ext: no edema, well perfused with 2+ pulses, Skin: no rash or lesions noted Lymph: no LAD  Data: Lab Results  Component Value Date   WBC 9.3 10/22/2014   HGB 10.5* 10/22/2014   HCT 32.4* 10/22/2014   MCV 82.8 10/22/2014   PLT 199 10/22/2014    Recent Labs Lab 10/17/14 0420 10/21/14 1116 10/22/14 0543  HGB 10.1* 11.2* 10.5*   Lab Results  Component Value Date   NA 134* 10/22/2014   K 4.5 10/22/2014   CL 101 10/22/2014   CO2 21* 10/22/2014   BUN 69* 10/22/2014   CREATININE 3.30* 10/22/2014   Lab Results  Component Value Date   ALT 226* 10/22/2014   AST 370* 10/22/2014   ALKPHOS 132* 10/22/2014   BILITOT 1.5* 10/22/2014    Recent Labs Lab 10/22/14 0543  APTT 35  INR 2.85   Imaging:  CLINICAL DATA: 81 year old female with intermittent abdominal pain, abnormal LFTs. Initial encounter.  EXAM: US ABDOMEN LIMITED - RIGHT UPPER QUADRANT  COMPARISON: CT Abdomen and Pelvis 10/23/2009.  FINDINGS: Gallbladder:  Generalize gallbladder wall thickening and heterogeneity, measuring up to 5-6 mm (image 15). No echogenic sludge or stones identified. Superimposed pericholecystic fluid suspected. No sonographic Murphy sign elicited.  Common bile duct:  Diameter: 2 mm, normal  Liver:  No focal lesion identified. Within normal limits in parenchymal echogenicity.  Other findings: Negative visible right kidney.  IMPRESSION: 1. Diffuse at abnormal gallbladder wall thickening. No sludge or stones identified. Top differential considerations include acute acalculous cholecystitis, and chronic cholecystitis. Nuclear medicine hepatobiliary scan may be able to differentiate further. 2. No biliary obstruction.   Electronically Signed  By: Genevie Ann M.D.  On: 10/21/2014 16:01 Assessment/Plan: Ms. Lemarr is a 81 y.o. female with elevated  liver enzymes, excessive eruction, bloating and  gas.  Ms. Laske was due to be seen in our office tomorrow, Aug 16th for the GI issues (not including the LFT's)  We will schedule a follow up visit with  her next week. Her LFT's were WNL on her previous hospitalization on October 16, 2014.  The patient is on Multaq.  Recommendations:  We recommend further evaluation of the gallbladder with a HIDA scan.  We agree with repeating CMP and LFTs.  We recommend a hepatitis panel along with ANA, ASMA and acetaminophen level.  Her thyroid was checked one month ago and was within normal limits.  We also recommend an H pylori stool antigen.  We recommend she be started on Protonix 40 mg twice a day. We will continue to follow with you Thank you for the consult. Please call with questions or concerns.  Salvadore Farber, PA-C  I personally performed these services.

## 2014-10-22 NOTE — Progress Notes (Addendum)
ANTICOAGULATION CONSULT NOTE - Initial Consult  Pharmacy Consult for warfarin Indication: atrial fibrillation  Allergies  Allergen Reactions  . Biaxin [Clarithromycin] Other (See Comments)    Numb lips  . Flagyl [Metronidazole] Other (See Comments)    Numb lips  . Penicillins Rash  . Tetracyclines & Related Rash    Patient Measurements: Height: 5\' 4"  (162.6 cm) Weight: 112 lb (50.803 kg) IBW/kg (Calculated) : 54.7   Vital Signs: Temp: 97.5 F (36.4 C) (08/15 1203) Temp Source: Oral (08/15 1203) BP: 97/59 mmHg (08/15 1430) Pulse Rate: 105 (08/15 1430)  Labs:  Recent Labs  10/21/14 1116 10/21/14 1842 10/21/14 2027 10/22/14 0543  HGB 11.2*  --   --  10.5*  HCT 34.8*  --   --  32.4*  PLT 240  --   --  199  APTT  --   --   --  35  LABPROT  --   --  31.2* 30.0*  INR  --   --  3.00 2.85  CREATININE 2.96* 3.19*  --  3.30*  TROPONINI 0.07*  --   --   --     Estimated Creatinine Clearance: 10.9 mL/min (by C-G formula based on Cr of 3.3).   Medical History: Past Medical History  Diagnosis Date  . Atrial fibrillation   . CHF (congestive heart failure)   . Shingles   . CAD (coronary artery disease)   . Pulmonary hypertension   . GERD (gastroesophageal reflux disease)   . CKD (chronic kidney disease)     Medications:  Prescriptions prior to admission  Medication Sig Dispense Refill Last Dose  . acetaminophen (TYLENOL) 325 MG tablet Take 325 mg by mouth every 6 (six) hours as needed for mild pain, moderate pain or fever.    Past Month at Unknown time  . aspirin EC 81 MG tablet Take 81 mg by mouth daily.   10/16/2014  . atorvastatin (LIPITOR) 20 MG tablet Take 20 mg by mouth at bedtime.    10/20/2014 at Unknown time  . Calcium Carb-Cholecalciferol (CALCIUM 600 + D PO) Take 1 tablet by mouth daily.     . Cholecalciferol 2000 UNITS TABS Take 1 tablet by mouth daily.   10/21/2014 at Unknown time  . docusate sodium (COLACE) 100 MG capsule Take 100 mg by mouth 2 (two)  times daily.   10/21/2014 at Unknown time  . dronedarone (MULTAQ) 400 MG tablet Take 400 mg by mouth 2 (two) times daily with a meal.   10/21/2014 at Unknown time  . furosemide (LASIX) 40 MG tablet Take 40 mg by mouth 2 (two) times daily.    10/21/2014 at Unknown time  . metoprolol succinate (TOPROL-XL) 25 MG 24 hr tablet Take 25 mg by mouth 2 (two) times daily.   10/21/2014 at 1000  . omeprazole (PRILOSEC) 20 MG capsule Take 20 mg by mouth daily.   10/20/2014 at Unknown time  . potassium chloride (MICRO-K) 10 MEQ CR capsule Take 10 mEq by mouth 2 (two) times daily.    10/21/2014 at Unknown time  . vitamin B-12 (CYANOCOBALAMIN) 1000 MCG tablet Take 1,000 mcg by mouth daily.   10/21/2014 at Unknown time  . warfarin (COUMADIN) 5 MG tablet Take 2.5-5 mg by mouth See admin instructions. Take 5mg  tablet orally once a day Monday and Friday. Take 1/2 tablet (2.5mg ) orally on Sunday, Tuesday, Wednesday, Thursday, and Saturday.   10/16/2014  . polyethylene glycol (MIRALAX / GLYCOLAX) packet Take 17 g by mouth daily. 30 each  0   . polyethylene glycol powder (MIRALAX) powder Take 17 g by mouth daily. 255 g 0     Assessment: Pharmacy consulted to dose warfarin for afib in this 81 year old female. Warfarin held on admission for thoracentesis. Procedure performed today, per MD will resume warfarin this evening.   Regimen PTA:  Warfarin 2.5mg  on Sun, Tues, Wed, Thurs, Sat Warfarin 5mg  on Monday and Friday  8/14 - INR: 3.0, warfarin held 8/15 - INR: 2.85  Goal of Therapy:  INR 2-3   Plan:  Will give coumadin 2.5mg  today and resume home regimen starting tomorrow. Will follow daily INR.  Pharmacy to follow per consult  Rexene Edison, PharmD Clinical Pharmacist  10/22/2014,3:32 PM

## 2014-10-22 NOTE — Consult Note (Signed)
  Pt not in room. Having HIDA and thoracentesis today. Daughter in room. Please see C. Celesta Aver' notes. Pt with LFT elevation. Pt's main complaint is SOB and significant belching despite PPI daily. Hx of A.fib. On coumadin and amiodarone. U/S showed thickened GB wall. Please increase PPI to bid. Due to her SOB and CHF and EF on 15%, pt not a good candidate for EGD. Repeat H. Pylori stool Ag. Has hx of H.pylori in the past. HIDA may r/o GB as cause of LFT elevation. If HIDA neg, order liver serologies. Amiodarone can cause LFT elevation. CHF can also cause passive congestion of liver and thus LFT elevation. Continue to moniter LFT. Will check back on pt tomorrow. Pt will need f/u appt with Dr. Hervey Ard. Jerelene Redden NP after discharge. Thanks.

## 2014-10-22 NOTE — Care Management Note (Signed)
Case Management Note  Patient Details  Name: Sheryl Hughes MRN: [id number] Date of Birth: 1932/01/05  Subjective/Objective:       81yo Mrs Sheryl Hughes was admitted 10/21/14 with shortness of breath, bilateral pleural effusions, pulmonary hypertension. INR 5 upon admission.On coumadin. Pacemaker. Resides with husband who has dementia. Son Lanny Hurst and daughter [name] are supportive and assist with transportation and other needs. No current home health services. Has used [facility] in the past and would like to use them again if she needs home health after discharge. PCP= Dr Frazier Richards. Pharmacy=[facility] in [place] equipment includes a rolling walker, a cane, and a raised toilet seat. No home oxygen. Case Management will follow for discharge planning.               Action/Plan:   Expected Discharge Date:                  Expected Discharge Plan:     In-House Referral:     Discharge planning Services     Post Acute Care Choice:    Choice offered to:     DME Arranged:    DME Agency:     HH Arranged:    [place] Agency:     Status of Service:     Medicare Important Message Given:  Yes-second notification given Date Medicare IM Given:    Medicare IM give by:    Date Additional Medicare IM Given:    Additional Medicare Important Message give by:     If discussed at [place] of Stay Meetings, dates discussed:    Additional Comments:  Sheryl Hughes A, RN 10/22/2014, 9:41 AM

## 2014-10-23 LAB — CBC
HEMATOCRIT: 31.8 % — AB (ref 35.0–47.0)
Hemoglobin: 10.3 g/dL — ABNORMAL LOW (ref 12.0–16.0)
MCH: 26.7 pg (ref 26.0–34.0)
MCHC: 32.3 g/dL (ref 32.0–36.0)
MCV: 82.5 fL (ref 80.0–100.0)
PLATELETS: 200 10*3/uL (ref 150–440)
RBC: 3.85 MIL/uL (ref 3.80–5.20)
RDW: 15.7 % — AB (ref 11.5–14.5)
WBC: 8.3 10*3/uL (ref 3.6–11.0)

## 2014-10-23 LAB — BASIC METABOLIC PANEL
ANION GAP: 12 (ref 5–15)
BUN: 65 mg/dL — ABNORMAL HIGH (ref 6–20)
CALCIUM: 8.3 mg/dL — AB (ref 8.9–10.3)
CO2: 22 mmol/L (ref 22–32)
Chloride: 99 mmol/L — ABNORMAL LOW (ref 101–111)
Creatinine, Ser: 2.56 mg/dL — ABNORMAL HIGH (ref 0.44–1.00)
GFR, EST AFRICAN AMERICAN: 19 mL/min — AB (ref >60)
GFR, EST NON AFRICAN AMERICAN: 17 mL/min — AB (ref >60)
Glucose, Bld: 97 mg/dL (ref 65–99)
POTASSIUM: 3.3 mmol/L — AB (ref 3.5–5.1)
Sodium: 133 mmol/L — ABNORMAL LOW (ref 135–145)

## 2014-10-23 LAB — HEPATIC FUNCTION PANEL
ALBUMIN: 3.1 g/dL — AB (ref 3.5–5.0)
ALT: 242 U/L — ABNORMAL HIGH (ref 14–54)
AST: 307 U/L — AB (ref 15–41)
Alkaline Phosphatase: 148 U/L — ABNORMAL HIGH (ref 38–126)
BILIRUBIN DIRECT: 0.4 mg/dL (ref 0.1–0.5)
Indirect Bilirubin: 0.8 mg/dL (ref 0.3–0.9)
TOTAL PROTEIN: 5.9 g/dL — AB (ref 6.5–8.1)
Total Bilirubin: 1.2 mg/dL (ref 0.3–1.2)

## 2014-10-23 LAB — ACETAMINOPHEN LEVEL: Acetaminophen (Tylenol), Serum: <10 ug/mL — ABNORMAL LOW (ref 10–30)

## 2014-10-23 LAB — PROTIME-INR
INR: 2.28
INR: 1.89
PROTHROMBIN TIME: 25.3 s — AB (ref 11.4–15.0)
Prothrombin Time: 21.9 seconds — ABNORMAL HIGH (ref 11.4–15.0)

## 2014-10-23 LAB — POTASSIUM: Potassium: 3.4 mmol/L — ABNORMAL LOW (ref 3.5–5.1)

## 2014-10-23 LAB — MAGNESIUM: MAGNESIUM: 2.2 mg/dL (ref 1.7–2.4)

## 2014-10-23 MED ORDER — POTASSIUM CHLORIDE CRYS ER 20 MEQ PO TBCR
40.0000 meq | EXTENDED_RELEASE_TABLET | Freq: Two times a day (BID) | ORAL | Status: AC
  Administered 2014-10-23 (×2): 40 meq via ORAL
  Filled 2014-10-23 (×2): qty 2

## 2014-10-23 MED ORDER — METOPROLOL TARTRATE 25 MG PO TABS
25.0000 mg | ORAL_TABLET | Freq: Two times a day (BID) | ORAL | Status: DC
  Administered 2014-10-23 – 2014-10-25 (×5): 25 mg via ORAL
  Filled 2014-10-23 (×6): qty 1

## 2014-10-23 MED ORDER — WARFARIN SODIUM 4 MG PO TABS
4.0000 mg | ORAL_TABLET | Freq: Once | ORAL | Status: AC
  Administered 2014-10-23: 4 mg via ORAL
  Filled 2014-10-23: qty 1

## 2014-10-23 MED ORDER — DILTIAZEM HCL 25 MG/5ML IV SOLN
10.0000 mg | Freq: Once | INTRAVENOUS | Status: AC
  Administered 2014-10-23: 10 mg via INTRAVENOUS
  Filled 2014-10-23: qty 5

## 2014-10-23 MED ORDER — WARFARIN SODIUM 5 MG PO TABS: 2.5000 mg | ORAL_TABLET | ORAL | Status: DC

## 2014-10-23 NOTE — Consult Note (Signed)
[facility] CONSULT NOTE    Date: 10/23/2014                  Patient Name:  Sheryl Hughes  MRN: [id number]  DOB: 1931-08-24  Age / Sex: 81 y.o., female         PCP: Kirk Ruths., MD                 Service Requesting Consult: Dr. Max Sane                 Reason for Consult: Acute renal failure, CKD stage III            History of Present Illness: Patient is a 81 y.o. female with a PMHx of chronic systolic heart faiulre EF 15%, atrial fibrillation, coronary artery disease, CKD stage III baseline Cr 1.03 with egfr of 66, pulmonary hypertension, coronary artery disease s/p CABG, history of pacemaker placement,who was admitted to [facility] on 10/21/2014 for evaluation of shortness of breath.  As above the patient has history of chronic systolic heart failure. Shortness of breath has been progressive several days prior to admission. She had orthopnea as well as paroxysmal nocturnal dyspnea. In addition she has increasing lower extremity edema. She was being treated with Lasix 40 mg TID. We're asked to see the patient for evaluation management of acute renal failure and CKD stage III.  Baseline Cr is 1.03 with an egfr of 52.    Medications: Outpatient medications: Prescriptions prior to admission  Medication Sig Dispense Refill Last Dose  . acetaminophen (TYLENOL) 325 MG tablet Take 325 mg by mouth every 6 (six) hours as needed for mild pain, moderate pain or fever.    Past Month at Unknown time  . aspirin EC 81 MG tablet Take 81 mg by mouth daily.   10/16/2014  . atorvastatin (LIPITOR) 20 MG tablet Take 20 mg by mouth at bedtime.    10/20/2014 at Unknown time  . Calcium Carb-Cholecalciferol (CALCIUM 600 + D PO) Take 1 tablet by mouth daily.     . Cholecalciferol 2000 UNITS TABS Take 1 tablet by mouth daily.   10/21/2014 at Unknown time  . docusate sodium (COLACE) 100 MG capsule Take 100 mg by mouth 2 (two) times daily.   10/21/2014 at Unknown time  . dronedarone  (MULTAQ) 400 MG tablet Take 400 mg by mouth 2 (two) times daily with a meal.   10/21/2014 at Unknown time  . furosemide (LASIX) 40 MG tablet Take 40 mg by mouth 2 (two) times daily.    10/21/2014 at Unknown time  . metoprolol succinate (TOPROL-XL) 25 MG 24 hr tablet Take 25 mg by mouth 2 (two) times daily.   10/21/2014 at 1000  . omeprazole (PRILOSEC) 20 MG capsule Take 20 mg by mouth daily.   10/20/2014 at Unknown time  . potassium chloride (MICRO-K) 10 MEQ CR capsule Take 10 mEq by mouth 2 (two) times daily.    10/21/2014 at Unknown time  . vitamin B-12 (CYANOCOBALAMIN) 1000 MCG tablet Take 1,000 mcg by mouth daily.   10/21/2014 at Unknown time  . warfarin (COUMADIN) 5 MG tablet Take 2.5-5 mg by mouth See admin instructions. Take 36m tablet orally once a day Monday and Friday. Take 1/2 tablet (2.560m orally on Sunday, Tuesday, Wednesday, Thursday, and Saturday.   10/16/2014  . polyethylene glycol (MIRALAX / GLYCOLAX) packet Take 17 g by mouth daily. 30 each 0   . polyethylene glycol powder (MIRALAX) powder Take  17 g by mouth daily. 255 g 0     Current medications: Current Facility-Administered Medications  Medication Dose Route Frequency Provider Last Rate Last Dose  . 0.9 %  sodium chloride infusion  250 mL Intravenous PRN Demetrios Loll, MD      . acetaminophen (TYLENOL) tablet 650 mg  650 mg Oral Q6H PRN Demetrios Loll, MD       Or  . acetaminophen (TYLENOL) suppository 650 mg  650 mg Rectal Q6H PRN Demetrios Loll, MD      . albuterol (PROVENTIL) (2.5 MG/3ML) 0.083% nebulizer solution 2.5 mg  2.5 mg Nebulization Q2H PRN Demetrios Loll, MD      . furosemide (LASIX) injection 40 mg  40 mg Intravenous Q12H Demetrios Loll, MD   40 mg at 10/23/14 0856  . metoprolol tartrate (LOPRESSOR) tablet 25 mg  25 mg Oral BID Max Sane, MD   25 mg at 10/23/14 0944  . ondansetron (ZOFRAN) tablet 4 mg  4 mg Oral Q6H PRN Demetrios Loll, MD       Or  . ondansetron [facility]) injection 4 mg  4 mg Intravenous Q6H PRN Demetrios Loll, MD      .  pantoprazole (PROTONIX) EC tablet 40 mg  40 mg Oral BID Max Sane, MD   40 mg at 10/23/14 0856  . potassium chloride SA (K-DUR,KLOR-CON) CR tablet 40 mEq  40 mEq Oral BID Max Sane, MD      . sodium chloride 0.9 % injection 3 mL  3 mL Intravenous Q12H Demetrios Loll, MD   3 mL at 10/23/14 0856  . sodium chloride 0.9 % injection 3 mL  3 mL Intravenous PRN Demetrios Loll, MD      . technetium TC 71M mebrofenin (CHOLETEC) injection 5 milli Curie  5 milli Curie Intravenous Once PRN Max Sane, MD   5.43 milli Curie at 10/22/14 1509  . warfarin (COUMADIN) tablet 2.5 mg  2.5 mg Oral ONCE-1800 Vipul Shah, MD   2.5 mg at 10/22/14 1800  . [START ON 10/24/2014] warfarin (COUMADIN) tablet 2.5 mg  2.5 mg Oral Once per day on Sun Tue Wed Thu Sat Max Sane, MD      . warfarin (COUMADIN) tablet 4 mg  4 mg Oral ONCE-1800 Max Sane, MD      . warfarin (COUMADIN) tablet 5 mg  5 mg Oral Once per day on Mon Fri Max Sane, MD      . Warfarin - Pharmacist Dosing Inpatient   Does not apply [id number] Max Sane, MD          Allergies: Allergies  Allergen Reactions  . Biaxin [Clarithromycin] Other (See Comments)    Numb lips  . Flagyl [Metronidazole] Other (See Comments)    Numb lips  . Penicillins Rash  . Tetracyclines & Related Rash      Past Medical History: Past Medical History  Diagnosis Date  . Atrial fibrillation   . CHF (congestive heart failure)   . Shingles   . CAD (coronary artery disease)   . Pulmonary hypertension   . GERD (gastroesophageal reflux disease)   . CKD (chronic kidney disease)      Past Surgical History: Past Surgical History  Procedure Laterality Date  . Mastectomy    . Cardiac catheterization    . Back surgery    . Eye surgery    . Thoracentesis Bilateral   . Pacemaker placement      defibrillator  . Mitral valve repair    . Coronary artery  bypass graft       Family History: Family History  Problem Relation Age of Onset  . Heart attack Mother   . Heart attack Father    . COPD Father   . Ulcers Father   . Atrial fibrillation Sister   . Lung cancer Sister   . Heart attack Brother   . Aortic stenosis Brother   . CAD Brother      Social History: Social History   Social History  . Marital Status: Married    Spouse Name: N/A  . Number of Children: N/A  . Years of Education: N/A   Occupational History  . Not on file.   Social History Main Topics  . Smoking status: Never Smoker   . Smokeless tobacco: Not on file  . Alcohol Use: No  . Drug Use: No  . Sexual Activity: Not on file   Other Topics Concern  . Not on file   Social History Narrative     Review of Systems: As per HPI  Vital Signs: Blood pressure 99/61, pulse 89, temperature 97.7 F (36.5 C), temperature source Oral, resp. rate 16, height '5\' 4"'  (1.626 m), weight 48.943 kg (107 lb 14.4 oz), SpO2 97 %.  Weight trends: Filed Weights   10/21/14 1740 10/22/14 0459 10/23/14 0408  Weight: 50.894 kg (112 lb 3.2 oz) 50.803 kg (112 lb) 48.943 kg (107 lb 14.4 oz)    Physical Exam: General: NAD, slender  Head: Normocephalic, atraumatic.  Eyes: Anicteric, EOMI  Nose: Mucous membranes moist, not inflammed, nonerythematous.  Throat: Oropharynx nonerythematous, no exudate appreciated.   Neck: supple  Lungs:  Normal respiratory effort. Basilar rales.  Heart: Irregular, S1S2  Abdomen:  BS normoactive. Soft, Nondistended, non-tender.  No masses or organomegaly.  Extremities: 2+ bilateral lower extremity edema.  Neurologic: A&O X3, Motor strength is 5/5 in the all 4 extremities  Skin: No visible rashes, scars.    Lab results: Basic Metabolic Panel:  Recent Labs Lab 10/21/14 1842 10/22/14 0543 10/23/14 0435  NA 132* 134* 133*  K 5.8* 4.5 3.3*  CL 99* 101 99*  CO2 18* 21* 22  GLUCOSE 111* 117* 97  BUN 62* 69* 65*  CREATININE 3.19* 3.30* 2.56*  CALCIUM 9.1 8.7* 8.3*  MG 2.4  --   --     Liver Function Tests:  Recent Labs Lab 10/21/14 1116 10/22/14 0543  10/23/14 0435  AST 277* 370* 307*  ALT 159* 226* 242*  ALKPHOS 130* 132* 148*  BILITOT 1.9* 1.5* 1.2  PROT 7.2 5.5* 5.9*  ALBUMIN 3.8 3.1* 3.1*    Recent Labs Lab 10/16/14 2246 10/22/14 1816  LIPASE 24  --   AMYLASE  --  53   No results for input(s): AMMONIA in the last 168 hours.  CBC:  Recent Labs Lab 10/16/14 2246 10/17/14 0420 10/21/14 1116 10/22/14 0543 10/23/14 0435  WBC 7.8 6.2 9.6 9.3 8.3  HGB 11.0* 10.1* 11.2* 10.5* 10.3*  HCT 33.6* 31.0* 34.8* 32.4* 31.8*  MCV 83.1 83.4 83.3 82.8 82.5  PLT 218 176 240 199 200    Cardiac Enzymes:  Recent Labs Lab 10/16/14 2246 10/17/14 0844 10/21/14 1116  TROPONINI 0.13* 0.09* 0.07*    BNP: Invalid input(s): POCBNP  CBG: No results for input(s): GLUCAP in the last 168 hours.  Microbiology: Results for orders placed or performed during the hospital encounter of 10/21/14  Body fluid culture     Status: None (Preliminary result)   Collection Time: 10/22/14  2:25 PM  Result Value Ref Range Status   Specimen Description FLUID  Final   Special Requests NONE  Final   Gram Stain PENDING  Incomplete   Culture NO GROWTH < 24 HOURS  Final   Report Status PENDING  Incomplete    Coagulation Studies:  Recent Labs  10/22/14 0543 10/23/14 0435 10/23/14 1145  LABPROT 30.0* 25.3* 21.9*  INR 2.85 2.28 1.89    Urinalysis:  Recent Labs  10/21/14 1155  COLORURINE AMBER*  LABSPEC 1.014  PHURINE 5.0  GLUCOSEU NEGATIVE  HGBUR NEGATIVE  BILIRUBINUR NEGATIVE  KETONESUR NEGATIVE  PROTEINUR 100*  NITRITE NEGATIVE  LEUKOCYTESUR TRACE*      Imaging: Dg Chest 1 View  10/22/2014   CLINICAL DATA:  Post thoracentesis.  EXAM: CHEST  1 VIEW  COMPARISON:  10/21/2014  FINDINGS: Improved aeration at the left lung base compatible with recent thoracentesis. No evidence for a pneumothorax. Again noted is enlargement of the heart with median sternotomy wires and cardiac ICD leads. Persistent densities at the right lung base  are compatible with right pleural fluid and consolidation/atelectasis.  IMPRESSION: Improved aeration in the left lung following a left thoracentesis. Negative for pneumothorax.  Persistent right basilar densities are compatible with pleural fluid and consolidation/volume loss.   Electronically Signed   By: Markus Daft M.D.   On: 10/22/2014 15:07   Nm Hepato W/eject Fract  10/22/2014   CLINICAL DATA:  Abdominal pain, nausea, gallbladder wall thickening on ultrasound  EXAM: NUCLEAR MEDICINE HEPATOBILIARY IMAGING WITH GALLBLADDER EF  TECHNIQUE: Sequential images of the abdomen were obtained out to 60 minutes following intravenous administration of radiopharmaceutical. After slow intravenous infusion of 1.02 micrograms Cholecystokinin, gallbladder ejection fraction was determined.  RADIOPHARMACEUTICALS:  5.43 mCi Tc-45mCholetec IV  COMPARISON:  Right upper quadrant sound dated 10/21/2014  FINDINGS: Normal hepatic excretion.  Visualization of the gallbladder and small bowel, confirming cystic and common duct patency.  Normal gallbladder ejection fraction of 82%. At 45 min, normal ejection fraction is greater than 40%.  IMPRESSION: Patent cystic and common duct.  Normal gallbladder ejection fraction following CCK infusion.   Electronically Signed   By: SJulian HyM.D.   On: 10/22/2014 17:44   [place]dom[place] Limited Ruq  10/21/2014   CLINICAL DATA:  81year old female with intermittent abdominal pain, abnormal LFTs. Initial encounter.  EXAM: [place]DOMEN LIMITED - RIGHT UPPER QUADRANT  COMPARISON:  CT Abdomen and Pelvis 10/23/2009.  FINDINGS: Gallbladder:  Generalize gallbladder wall thickening and heterogeneity, measuring up to 5-6 mm (image 15). No echogenic sludge or stones identified. Superimposed pericholecystic fluid suspected. No sonographic Murphy sign elicited.  Common bile duct:  Diameter: 2 mm, normal  Liver:  No focal lesion identified. Within normal limits in parenchymal echogenicity.  Other findings:  Negative visible right kidney.  IMPRESSION: 1. Diffuse at abnormal gallbladder wall thickening. No sludge or stones identified. Top differential considerations include acute acalculous cholecystitis, and chronic cholecystitis. Nuclear medicine hepatobiliary scan may be able to differentiate further. 2. No biliary obstruction.   Electronically Signed   By: HGenevie AnnM.D.   On: 10/21/2014 16:01   [place]oracentesis Asp Pleural Space W/img Guide  10/22/2014   CLINICAL DATA:  Bilateral pleural effusions  EXAM: ULTRASOUND GUIDED left THORACENTESIS  PROCEDURE: An ultrasound guided thoracentesis was thoroughly discussed with the patient and questions answered. The benefits, risks, alternatives and complications were also discussed. The patient understands and wishes to proceed with the procedure. Written consent was obtained.  Ultrasound was performed to localize  and mark an adequate pocket of fluid in the left chest. The area was then prepped and draped in the normal sterile fashion. 1% Lidocaine was used for local anesthesia. Under ultrasound guidance a 6 French thoracentesis catheter was introduced. Thoracentesis was performed. The catheter was removed and a dressing applied.  COMPLICATIONS: None  FINDINGS: A total of approximately 500 mL of clear yellow fluid was removed. A fluid sample was sent for laboratory analysis.  IMPRESSION: Successful ultrasound guided left thoracentesis yielding 500 mL of pleural fluid.   Electronically Signed   By: Inez Catalina M.D.   On: 10/22/2014 16:00      Assessment & Plan: Pt is a 81 y.o. yo female with a PMHx of chronic systolic heart faiulre EF 15%, atrial fibrillation, coronary artery disease, CKD stage III baseline Cr 1.03 with egfr of 35, pulmonary hypertension, coronary artery disease s/p CABG, history of pacemaker placement,who was admitted to [facility] on 10/21/2014 for evaluation of shortness of breath.  1.  Acute renal failure/CKD stage III baseline Cr 1.03/proteinuria egfr  of 56.  Suspect acute renal failure now due to abnormal cardiorenal hemodynamics.  Cr peaked at 3.3, currently down to 2.56.  Ok to continue lasix at this time.  Will need to monitor renal function closely while on IV lasix.  Check renal US, spep, upep, PTH, phos, vitamin D 25.  Her renal function is likely to fluctuate over time given severe heart failure.  2.  Hyponatremia: likely due to hypervolemia, continue lasix for now, may need to consider tolvaptan as well.  3.  Hypokalemia:  Continue KCL repletion, may need to consider IV if K remains low.   4.  Acute systolic heart failure exacerbation: continue lasix 66m IV q12 hours for now, follow electrolytes and renal function closely while on lasix.

## 2014-10-23 NOTE — Clinical Social Work Note (Signed)
CSW spoke with patient and daughter: Sheryl Hughes this afternoon. Full note to follow but complete assessment done this afternoon. Bedsearch initiated. Shela Leff MSW,LCSW [phone]

## 2014-10-23 NOTE — Consult Note (Signed)
Reason for Consult: shortness of breath pleural effusions Congestive Heart Failure Referring Physician:  Ouida Sills,  Dr. Bridgett Larsson  hospitalist  Sheryl Hughes is an 81 y.o. female.  HPI:  81 year old white female known history of congestive heart failure and ischemic cardiomyopathy ejection fraction of around 20% atrial fibrillation pulmonary hypertension, chronic pleural effusions coronary disease renal insufficiency presented with worsening shortness of breath weakness fatigue.  Patient complains of persistent shortness of breath with significant pleural effusion presents for thoracentesis and evaluation of liver disease. Patient has a history of pulmonary hypertension renal insufficiency known coronary bypass surgery. She is status post AICD permanent pacemaker followed at [facility]. Patient denies significant chest discomfort persistent weakness and fatigue she was found have elevated borderline troponins with renal insufficiency but she was admitted for thoracentesis and treatment of her heart failure and shortness of breath.  Past Medical History  Diagnosis Date  . Atrial fibrillation   . CHF (congestive heart failure)   . Shingles   . CAD (coronary artery disease)   . Pulmonary hypertension   . GERD (gastroesophageal reflux disease)   . CKD (chronic kidney disease)     Past Surgical History  Procedure Laterality Date  . Mastectomy    . Cardiac catheterization    . Back surgery    . Eye surgery    . Thoracentesis Bilateral   . Pacemaker placement      defibrillator  . Mitral valve repair    . Coronary artery bypass graft      Family History  Problem Relation Age of Onset  . Heart attack Mother   . Heart attack Father   . COPD Father   . Ulcers Father   . Atrial fibrillation Sister   . Lung cancer Sister   . Heart attack Brother   . Aortic stenosis Brother   . CAD Brother     Social History:  reports that she has never smoked. She does not have any smokeless tobacco history on  file. She reports that she does not drink alcohol or use illicit drugs.  Allergies:  Allergies  Allergen Reactions  . Biaxin [Clarithromycin] Other (See Comments)    Numb lips  . Flagyl [Metronidazole] Other (See Comments)    Numb lips  . Penicillins Rash  . Tetracyclines & Related Rash    Medications: I have reviewed the patient's current medications.  Results for orders placed or performed during the hospital encounter of 10/21/14 (from the past 48 hour(s))  Magnesium     Status: None   Collection Time: 10/21/14  6:42 PM  Result Value Ref Range   Magnesium 2.4 1.7 - 2.4 mg/dL  Basic metabolic panel     Status: Abnormal   Collection Time: 10/21/14  6:42 PM  Result Value Ref Range   Sodium 132 (L) 135 - 145 mmol/L   Potassium 5.8 (H) 3.5 - 5.1 mmol/L   Chloride 99 (L) 101 - 111 mmol/L   CO2 18 (L) 22 - 32 mmol/L   Glucose, Bld 111 (H) 65 - 99 mg/dL   BUN 62 (H) 6 - 20 mg/dL   Creatinine, Ser 3.19 (H) 0.44 - 1.00 mg/dL   Calcium 9.1 8.9 - 10.3 mg/dL   GFR calc non Af Amer 13 (L) >60 mL/min   GFR calc Af Amer 15 (L) >60 mL/min    Comment: (NOTE) The eGFR has been calculated using the CKD EPI equation. This calculation has not been validated in all clinical situations. eGFR's persistently <60 mL/min  signify possible Chronic Kidney Disease.    Anion gap 15 5 - 15  Protime-INR     Status: Abnormal   Collection Time: 10/21/14  8:27 PM  Result Value Ref Range   Prothrombin Time 31.2 (H) 11.4 - 15.0 seconds   INR 3.00   CBC     Status: Abnormal   Collection Time: 10/22/14  5:43 AM  Result Value Ref Range   WBC 9.3 3.6 - 11.0 K/uL   RBC 3.91 3.80 - 5.20 MIL/uL   Hemoglobin 10.5 (L) 12.0 - 16.0 g/dL   HCT 32.4 (L) 35.0 - 47.0 %   MCV 82.8 80.0 - 100.0 fL   MCH 26.8 26.0 - 34.0 pg   MCHC 32.3 32.0 - 36.0 g/dL   RDW 15.4 (H) 11.5 - 14.5 %   Platelets 199 150 - 440 K/uL  Protime-INR     Status: Abnormal   Collection Time: 10/22/14  5:43 AM  Result Value Ref Range    Prothrombin Time 30.0 (H) 11.4 - 15.0 seconds   INR 2.85   APTT     Status: None   Collection Time: 10/22/14  5:43 AM  Result Value Ref Range   aPTT 35 24 - 36 seconds  Comprehensive metabolic panel     Status: Abnormal   Collection Time: 10/22/14  5:43 AM  Result Value Ref Range   Sodium 134 (L) 135 - 145 mmol/L   Potassium 4.5 3.5 - 5.1 mmol/L   Chloride 101 101 - 111 mmol/L   CO2 21 (L) 22 - 32 mmol/L   Glucose, Bld 117 (H) 65 - 99 mg/dL   BUN 69 (H) 6 - 20 mg/dL   Creatinine, Ser 3.30 (H) 0.44 - 1.00 mg/dL   Calcium 8.7 (L) 8.9 - 10.3 mg/dL   Total Protein 5.5 (L) 6.5 - 8.1 g/dL   Albumin 3.1 (L) 3.5 - 5.0 g/dL   AST 370 (H) 15 - 41 U/L   ALT 226 (H) 14 - 54 U/L   Alkaline Phosphatase 132 (H) 38 - 126 U/L   Total Bilirubin 1.5 (H) 0.3 - 1.2 mg/dL   GFR calc non Af Amer 12 (L) >60 mL/min   GFR calc Af Amer 14 (L) >60 mL/min    Comment: (NOTE) The eGFR has been calculated using the CKD EPI equation. This calculation has not been validated in all clinical situations. eGFR's persistently <60 mL/min signify possible Chronic Kidney Disease.    Anion gap 12 5 - 15  Cholesterol, total     Status: None   Collection Time: 10/22/14  5:43 AM  Result Value Ref Range   Cholesterol 90 0 - 200 mg/dL  Triglycerides     Status: None   Collection Time: 10/22/14  5:43 AM  Result Value Ref Range   Triglycerides 78 <150 mg/dL  Lactate dehydrogenase (CSF, pleural or peritoneal fluid)     Status: Abnormal   Collection Time: 10/22/14  2:25 PM  Result Value Ref Range   LD, Fluid 73 (H) 3 - 23 U/L    Comment: (NOTE) Results should be evaluated in conjunction with serum values    Fluid Type-FLDH CYTO PLEU   Protein, pleural or peritoneal fluid     Status: None   Collection Time: 10/22/14  2:25 PM  Result Value Ref Range   Total protein, fluid <3.0 g/dL    Comment: (NOTE) No normal range established for this test Results should be evaluated in conjunction with serum values  Fluid  Type-FTP CYTO PLEU   Body fluid cell count with differential     Status: Abnormal   Collection Time: 10/22/14  2:25 PM  Result Value Ref Range   Fluid Type-FCT CYTO PLEU    Color, Fluid YELLOW (A) YELLOW   Appearance, Fluid HAZY (A) CLEAR   WBC, Fluid 43 cu mm   Neutrophil Count, Fluid 60 (H) 0 - 25 %    Comment: CORRECTED ON 08/15 AT 2205: PREVIOUSLY REPORTED AS 63   Lymphs, Fluid 36 %   Monocyte-Macrophage-Serous Fluid 4 %   Eos, Fluid 0 %   Other Cells, Fluid 0 %  Glucose, pleural or peritoneal fluid     Status: None   Collection Time: 10/22/14  2:25 PM  Result Value Ref Range   Glucose, Fluid 119 mg/dL    Comment: (NOTE) No normal range established for this test Results should be evaluated in conjunction with serum values    Fluid Type-FGLU CYTO PLEU   Amylase     Status: None   Collection Time: 10/22/14  6:16 PM  Result Value Ref Range   Amylase 53 28 - 100 U/L  CBC     Status: Abnormal   Collection Time: 10/23/14  4:35 AM  Result Value Ref Range   WBC 8.3 3.6 - 11.0 K/uL   RBC 3.85 3.80 - 5.20 MIL/uL   Hemoglobin 10.3 (L) 12.0 - 16.0 g/dL   HCT 31.8 (L) 35.0 - 47.0 %   MCV 82.5 80.0 - 100.0 fL   MCH 26.7 26.0 - 34.0 pg   MCHC 32.3 32.0 - 36.0 g/dL   RDW 15.7 (H) 11.5 - 14.5 %   Platelets 200 150 - 440 K/uL  Basic metabolic panel     Status: Abnormal   Collection Time: 10/23/14  4:35 AM  Result Value Ref Range   Sodium 133 (L) 135 - 145 mmol/L   Potassium 3.3 (L) 3.5 - 5.1 mmol/L   Chloride 99 (L) 101 - 111 mmol/L   CO2 22 22 - 32 mmol/L   Glucose, Bld 97 65 - 99 mg/dL   BUN 65 (H) 6 - 20 mg/dL   Creatinine, Ser 2.56 (H) 0.44 - 1.00 mg/dL   Calcium 8.3 (L) 8.9 - 10.3 mg/dL   GFR calc non Af Amer 17 (L) >60 mL/min   GFR calc Af Amer 19 (L) >60 mL/min    Comment: (NOTE) The eGFR has been calculated using the CKD EPI equation. This calculation has not been validated in all clinical situations. eGFR's persistently <60 mL/min signify possible Chronic  Kidney Disease.    Anion gap 12 5 - 15  Hepatic function panel     Status: Abnormal   Collection Time: 10/23/14  4:35 AM  Result Value Ref Range   Total Protein 5.9 (L) 6.5 - 8.1 g/dL   Albumin 3.1 (L) 3.5 - 5.0 g/dL   AST 307 (H) 15 - 41 U/L   ALT 242 (H) 14 - 54 U/L   Alkaline Phosphatase 148 (H) 38 - 126 U/L   Total Bilirubin 1.2 0.3 - 1.2 mg/dL   Bilirubin, Direct 0.4 0.1 - 0.5 mg/dL   Indirect Bilirubin 0.8 0.3 - 0.9 mg/dL  Acetaminophen level     Status: Abnormal   Collection Time: 10/23/14  4:35 AM  Result Value Ref Range   Acetaminophen (Tylenol), Serum <10 (L) 10 - 30 ug/mL    Comment:        THERAPEUTIC CONCENTRATIONS VARY SIGNIFICANTLY. A  RANGE OF 10-30 ug/mL MAY BE AN EFFECTIVE CONCENTRATION FOR MANY PATIENTS. HOWEVER, SOME ARE BEST TREATED AT CONCENTRATIONS OUTSIDE THIS RANGE. ACETAMINOPHEN CONCENTRATIONS >150 ug/mL AT 4 HOURS AFTER INGESTION AND >50 ug/mL AT 12 HOURS AFTER INGESTION ARE OFTEN ASSOCIATED WITH TOXIC REACTIONS.   Protime-INR     Status: Abnormal   Collection Time: 10/23/14  4:35 AM  Result Value Ref Range   Prothrombin Time 25.3 (H) 11.4 - 15.0 seconds   INR 2.28   Protime-INR     Status: Abnormal   Collection Time: 10/23/14 11:45 AM  Result Value Ref Range   Prothrombin Time 21.9 (H) 11.4 - 15.0 seconds   INR 1.89     Dg Chest 1 View  10/22/2014   CLINICAL DATA:  Post thoracentesis.  EXAM: CHEST  1 VIEW  COMPARISON:  10/21/2014  FINDINGS: Improved aeration at the left lung base compatible with recent thoracentesis. No evidence for a pneumothorax. Again noted is enlargement of the heart with median sternotomy wires and cardiac ICD leads. Persistent densities at the right lung base are compatible with right pleural fluid and consolidation/atelectasis.  IMPRESSION: Improved aeration in the left lung following a left thoracentesis. Negative for pneumothorax.  Persistent right basilar densities are compatible with pleural fluid and  consolidation/volume loss.   Electronically Signed   By: Markus Daft M.D.   On: 10/22/2014 15:07   Nm Hepato W/eject Fract  10/22/2014   CLINICAL DATA:  Abdominal pain, nausea, gallbladder wall thickening on ultrasound  EXAM: NUCLEAR MEDICINE HEPATOBILIARY IMAGING WITH GALLBLADDER EF  TECHNIQUE: Sequential images of the abdomen were obtained out to 60 minutes following intravenous administration of radiopharmaceutical. After slow intravenous infusion of 1.02 micrograms Cholecystokinin, gallbladder ejection fraction was determined.  RADIOPHARMACEUTICALS:  5.43 mCi Tc-49mCholetec IV  COMPARISON:  Right upper quadrant sound dated 10/21/2014  FINDINGS: Normal hepatic excretion.  Visualization of the gallbladder and small bowel, confirming cystic and common duct patency.  Normal gallbladder ejection fraction of 82%. At 45 min, normal ejection fraction is greater than 40%.  IMPRESSION: Patent cystic and common duct.  Normal gallbladder ejection fraction following CCK infusion.   Electronically Signed   By: SJulian HyM.D.   On: 10/22/2014 17:44   [place]dom[place] Limited Ruq  10/21/2014   CLINICAL DATA:  81year old female with intermittent abdominal pain, abnormal LFTs. Initial encounter.  EXAM: [place]DOMEN LIMITED - RIGHT UPPER QUADRANT  COMPARISON:  CT Abdomen and Pelvis 10/23/2009.  FINDINGS: Gallbladder:  Generalize gallbladder wall thickening and heterogeneity, measuring up to 5-6 mm (image 15). No echogenic sludge or stones identified. Superimposed pericholecystic fluid suspected. No sonographic Murphy sign elicited.  Common bile duct:  Diameter: 2 mm, normal  Liver:  No focal lesion identified. Within normal limits in parenchymal echogenicity.  Other findings: Negative visible right kidney.  IMPRESSION: 1. Diffuse at abnormal gallbladder wall thickening. No sludge or stones identified. Top differential considerations include acute acalculous cholecystitis, and chronic cholecystitis. Nuclear medicine  hepatobiliary scan may be able to differentiate further. 2. No biliary obstruction.   Electronically Signed   By: HGenevie AnnM.D.   On: 10/21/2014 16:01   [place]oracentesis Asp Pleural Space W/img Guide  10/22/2014   CLINICAL DATA:  Bilateral pleural effusions  EXAM: ULTRASOUND GUIDED left THORACENTESIS  PROCEDURE: An ultrasound guided thoracentesis was thoroughly discussed with the patient and questions answered. The benefits, risks, alternatives and complications were also discussed. The patient understands and wishes to proceed with the procedure. Written consent was obtained.  Ultrasound was performed to localize and mark an adequate pocket of fluid in the left chest. The area was then prepped and draped in the normal sterile fashion. 1% Lidocaine was used for local anesthesia. Under ultrasound guidance a 6 French thoracentesis catheter was introduced. Thoracentesis was performed. The catheter was removed and a dressing applied.  COMPLICATIONS: None  FINDINGS: A total of approximately 500 mL of clear yellow fluid was removed. A fluid sample was sent for laboratory analysis.  IMPRESSION: Successful ultrasound guided left thoracentesis yielding 500 mL of pleural fluid.   Electronically Signed   By: Inez Catalina M.D.   On: 10/22/2014 16:00    Review of Systems  Constitutional: Positive for malaise/fatigue.  HENT: Positive for congestion.   Respiratory: Positive for shortness of breath.   Cardiovascular: Positive for orthopnea, leg swelling and PND.  Gastrointestinal: Negative.   Genitourinary: Negative.   Musculoskeletal: Positive for myalgias.  Skin: Negative.   Neurological: Positive for weakness.  Psychiatric/Behavioral: Negative.    Blood pressure 92/60, pulse 82, temperature 97.7 F (36.5 C), temperature source Oral, resp. rate 16, height _0  (1.626 m), weight 48.943 kg (107 lb 14.4 oz), SpO2 100 %. Physical Exam  Constitutional: She is oriented to person, place, and time. She appears  well-developed and well-nourished.  HENT:  Head: Normocephalic.  Right Ear: External ear normal.  Eyes: Conjunctivae are normal. Pupils are equal, round, and reactive to light.  Neck: Normal range of motion. Neck supple.  Cardiovascular: Normal pulses.  An irregularly irregular rhythm present. PMI is displaced.  Exam reveals gallop, S3, distant heart sounds and friction rub.   Murmur heard.  Systolic murmur is present with a grade of 2/6  Respiratory: Effort normal. She has decreased breath sounds in the right lower field and the left lower field.  GI: Soft.  Musculoskeletal: Normal range of motion.  Neurological: She is alert and oriented to person, place, and time.  Skin: Skin is warm.  Psychiatric: She has a normal mood and affect.    Assessment/Plan:  Congestive Heart Failure  pleural effusions  ischemic cardiomyopathy  pulmonary hypertension  atrial fibrillation  acute on chronic renal insufficiency  hyperkalemia  hyperlipidemia  coronary disease  AICD permanent pacemaker  borderline troponin . PLAN  agree with the admit placed on telemetry  agree with thoracentesis bilaterally  continue Heart failure therapy  hold Coumadin until after thoracentesis  continue reflux therapy  follow-up AICD permanent pacemaker interrogation at [facility]  elevated troponin is probably demand ischemia as well as renal insufficiency  follow up with Nephrology for renal insufficiency  inhalers as necessary for mild Chronic obstructive pulmonary disease  Protonix therapy for GERD symptoms  continue to follow up with heart failure team Clinic  aggressive medical therapy in addition to thoracentesis  Avyaan Summer D. 10/23/2014, 1:15 PM

## 2014-10-23 NOTE — Care Management Note (Signed)
Case Management Note  Patient Details  Name: Sheryl Hughes MRN: [id number] Date of Birth: 1931-08-28  Subjective/Objective:     PT working with Sheryl Hughes. Daughter Sheryl Hughes at bedside requesting discharge to rehab. Awaiting PT's recommendation.                Action/Plan:   Expected Discharge Date:                  Expected Discharge Plan:     In-House Referral:     Discharge planning Services     Post Acute Care Choice:    Choice offered to:     DME Arranged:    DME Agency:     HH Arranged:    [place] Agency:     Status of Service:     Medicare Important Message Given:  Yes-second notification given Date Medicare IM Given:    Medicare IM give by:    Date Additional Medicare IM Given:    Additional Medicare Important Message give by:     If discussed at [place] of Stay Meetings, dates discussed:    Additional Comments:  Sheryl Payeur A, RN 10/23/2014, 1:37 PM

## 2014-10-23 NOTE — Progress Notes (Signed)
Patient was made an appointment at the outpatient [facility] on November 07, 2014 at 11:00am. Thank you for the referral.

## 2014-10-23 NOTE — Progress Notes (Signed)
Date: 10/23/2014,   MRN# [id number] Sheryl Hughes 03-12-1931 Code Status:     Code Status Orders        Start     Ordered   10/21/14 1820  Full code   Continuous     10/21/14 1819    Advance Directive Documentation        Most Recent Value   Type of Advance Directive  Living will   Pre-existing out of facility DNR order (yellow form or pink MOST form)     "MOST" Form in Place?       Hosp day:@LENGTHOFSTAYDAYS @ Referring MD: @ATDPROV @         AdmissionWeight: 111 lb (50.349 kg)                 CurrentWeight: 107 lb 14.4 oz (48.943 kg)  HPI: Sitting up in the chair, no complaints, off oxygen, speaking in full sentences.   PMHX:   Past Medical History  Diagnosis Date  . Atrial fibrillation   . CHF (congestive heart failure)   . Shingles   . CAD (coronary artery disease)   . Pulmonary hypertension   . GERD (gastroesophageal reflux disease)   . CKD (chronic kidney disease)    Surgical Hx:  Past Surgical History  Procedure Laterality Date  . Mastectomy    . Cardiac catheterization    . Back surgery    . Eye surgery    . Thoracentesis Bilateral   . Pacemaker placement      defibrillator  . Mitral valve repair    . Coronary artery bypass graft     Family Hx:  Family History  Problem Relation Age of Onset  . Heart attack Mother   . Heart attack Father   . COPD Father   . Ulcers Father   . Atrial fibrillation Sister   . Lung cancer Sister   . Heart attack Brother   . Aortic stenosis Brother   . CAD Brother    Social Hx:   Social History  Substance Use Topics  . Smoking status: Never Smoker   . Smokeless tobacco: None  . Alcohol Use: No   Medication:    Home Medication:  No current outpatient prescriptions on file.  Current Medication: @CURMEDTAB @   Allergies:  Biaxin; Flagyl; Penicillins; and Tetracyclines & related  Review of Systems: Gen:  Denies  fever, sweats, chills HEENT: Denies blurred vision, double vision, ear pain, eye pain, hearing  loss, nose bleeds, sore throat Cvc:  No dizziness, chest pain or heaviness, no syncope Resp:   Less sob, s/p thoracentesis yesterday, no chest pain, ectopy Gi: Denies swallowing difficulty, stomach pain, nausea or vomiting, diarrhea, constipation, bowel incontinence Gu:  Denies bladder incontinence, burning urine Ext:   No Joint pain, stiffness or swelling Skin: No skin rash, easy bruising or bleeding or hives Endoc:  No polyuria, polydipsia , polyphagia or weight change Psych: No depression, insomnia or hallucinations  Other:  All other systems negative  Physical Examination:   VS: BP 92/66 mmHg  Pulse 113  Temp(Src) 97.8 F (36.6 C) (Oral)  Resp 18  Ht 5\' 4"  (1.626 m)  Wt 107 lb 14.4 oz (48.943 kg)  BMI 18.51 kg/m2  SpO2 100%  General Appearance: No distress  Neuropsych without focal findings, mental status, speech normal, alert and oriented, cranial nerves 2-12 intact, reflexes normal and symmetric, sensation grossly normal  HEENT: PERRLA, EOM intact, no ptosis, no other lesions noticed Pulmonary:.No wheezing, No rales  More air movement  on the left   Cardiovascular:  Normal S1,S2.  + ve m present  Abdomen:Benign, Soft, non-tender, No masses, hepatosplenomegaly, No lymphadenopathy Endoc: No evident thyromegaly, no signs of acromegaly or Cushing features Skin:   warm, no rashes, no ecchymosis  Extremities: normal, no cyanosis, clubbing, no edema, warm with normal capillary refill. Other findings:   Labs results:   Recent Labs     10/21/14  1116  10/21/14  1842  10/21/14  2027  10/22/14  0543  10/23/14  0435  10/23/14  1145  HGB  11.2*   --    --   10.5*  10.3*   --   HCT  34.8*   --    --   32.4*  31.8*   --   MCV  83.3   --    --   82.8  82.5   --   WBC  9.6   --    --   9.3  8.3   --   BUN  62*  62*   --   69*  65*   --   CREATININE  2.96*  3.19*   --   3.30*  2.56*   --   GLUCOSE  138*  111*   --   117*  97   --   CALCIUM  9.2  9.1   --   8.7*  8.3*   --   INR    --    --   3.00  2.85  2.28  1.89   Pleural fluid:  T.prot < 3, LDH73, gluc 119, culture, cytology pending    Assessment and Plan: Dyspnea on exertion: [facility], due to cardiomyopathy, EF 15-20 %, bilateral pleural effusions (most likely cardiac in origin, transudate), +/- deconditioned, Diaphragm not paralyzed. Elevated LFTS, ? Passive congestion, ? Liver dz, gallbladder etc.   -continue watching salt, fluid intake -diuretics -following cardiology recs -therapeutic rightt thoracentesis in am -send fluid for LDH, t,prot, gluc, cell count with diff, cytology, culture -further orders per above -out patient follow up  Cardiomyopathy, ef 15 % -following cardiology recs  Acute on chronic renal desase -serial bmp   I have personally obtained a history, examined the patient, evaluated laboratory and imaging results, formulated the assessment and plan and placed orders.  The Patient requires high complexity decision making for assessment and support, frequent evaluation and titration of therapies, application of advanced monitoring technologies and extensive interpretation of multiple databases.   Adom Schoeneck,M.D. Pulmonary & Critical care Medicine [facility]

## 2014-10-23 NOTE — OR Nursing (Signed)
Dr Golden Circle ok'ed to do thoracentesis tomorrow. Labs reviewed with Dr Golden Circle. Kim from [place] informed nurse to consent pt and not hold coumadin per dr Golden Circle.

## 2014-10-23 NOTE — Consult Note (Signed)
[facility] Cardiology  CARDIOLOGY CONSULT NOTE  Patient ID: Sheryl Hughes MRN: [id number] DOB/AGE: 1931-05-25 81 y.o.  Admit date: 10/21/2014 Referring Physician Bridgett Larsson Primary Physician Frazier Richards M.D. Primary Cardiologist Miquel Dunn M.D. Reason for Consultation congestive heart failure  HPI: 81 year old female referred for evaluation of congestive heart failure. The patient has known coronary artery disease, status post CABG, severe dilated cardiomyopathy, chronic systolic congestive heart failure and atrial fibrillation. The patient's had a long-standing history of chronic congestive heart failure, with 3-6 week history of increasing shortness of breath. Chest x-ray revealed bilateral pleural effusions. Thoracentesis has been delayed since patient was on warfarin therapy. The patient underwent left thoracentesis yesterday with a moderate clinical improvement. The patient is scheduled for right thoracentesis today.  Review of systems complete and found to be negative unless listed above     Past Medical History  Diagnosis Date  . Atrial fibrillation   . CHF (congestive heart failure)   . Shingles   . CAD (coronary artery disease)   . Pulmonary hypertension   . GERD (gastroesophageal reflux disease)   . CKD (chronic kidney disease)     Past Surgical History  Procedure Laterality Date  . Mastectomy    . Cardiac catheterization    . Back surgery    . Eye surgery    . Thoracentesis Bilateral   . Pacemaker placement      defibrillator  . Mitral valve repair    . Coronary artery bypass graft      Prescriptions prior to admission  Medication Sig Dispense Refill Last Dose  . acetaminophen (TYLENOL) 325 MG tablet Take 325 mg by mouth every 6 (six) hours as needed for mild pain, moderate pain or fever.    Past Month at Unknown time  . aspirin EC 81 MG tablet Take 81 mg by mouth daily.   10/16/2014  . atorvastatin (LIPITOR) 20 MG tablet Take 20 mg by mouth at bedtime.    10/20/2014 at  Unknown time  . Calcium Carb-Cholecalciferol (CALCIUM 600 + D PO) Take 1 tablet by mouth daily.     . Cholecalciferol 2000 UNITS TABS Take 1 tablet by mouth daily.   10/21/2014 at Unknown time  . docusate sodium (COLACE) 100 MG capsule Take 100 mg by mouth 2 (two) times daily.   10/21/2014 at Unknown time  . dronedarone (MULTAQ) 400 MG tablet Take 400 mg by mouth 2 (two) times daily with a meal.   10/21/2014 at Unknown time  . furosemide (LASIX) 40 MG tablet Take 40 mg by mouth 2 (two) times daily.    10/21/2014 at Unknown time  . metoprolol succinate (TOPROL-XL) 25 MG 24 hr tablet Take 25 mg by mouth 2 (two) times daily.   10/21/2014 at 1000  . omeprazole (PRILOSEC) 20 MG capsule Take 20 mg by mouth daily.   10/20/2014 at Unknown time  . potassium chloride (MICRO-K) 10 MEQ CR capsule Take 10 mEq by mouth 2 (two) times daily.    10/21/2014 at Unknown time  . vitamin B-12 (CYANOCOBALAMIN) 1000 MCG tablet Take 1,000 mcg by mouth daily.   10/21/2014 at Unknown time  . warfarin (COUMADIN) 5 MG tablet Take 2.5-5 mg by mouth See admin instructions. Take 5mg  tablet orally once a day Monday and Friday. Take 1/2 tablet (2.5mg ) orally on Sunday, Tuesday, Wednesday, Thursday, and Saturday.   10/16/2014  . polyethylene glycol (MIRALAX / GLYCOLAX) packet Take 17 g by mouth daily. 30 each 0   . polyethylene glycol powder (MIRALAX)  powder Take 17 g by mouth daily. 255 g 0    Social History   Social History  . Marital Status: Married    Spouse Name: N/A  . Number of Children: N/A  . Years of Education: N/A   Occupational History  . Not on file.   Social History Main Topics  . Smoking status: Never Smoker   . Smokeless tobacco: Not on file  . Alcohol Use: No  . Drug Use: No  . Sexual Activity: Not on file   Other Topics Concern  . Not on file   Social History Narrative    Family History  Problem Relation Age of Onset  . Heart attack Mother   . Heart attack Father   . COPD Father   . Ulcers Father   .  Atrial fibrillation Sister   . Lung cancer Sister   . Heart attack Brother   . Aortic stenosis Brother   . CAD Brother       Review of systems complete and found to be negative unless listed above      PHYSICAL EXAM  General: Well developed, well nourished, in no acute distress HEENT:  Normocephalic and atramatic Neck:  No JVD.  Lungs: Clear bilaterally to auscultation and percussion. Heart: HRRR . Normal S1 and S2 without gallops or murmurs.  Abdomen: Bowel sounds are positive, abdomen soft and non-tender  Msk:  Back normal, normal gait. Normal strength and tone for age. Extremities: No clubbing, cyanosis or edema.   Neuro: Alert and oriented X 3. Psych:  Good affect, responds appropriately  Labs:   Lab Results  Component Value Date   WBC 8.3 10/23/2014   HGB 10.3* 10/23/2014   HCT 31.8* 10/23/2014   MCV 82.5 10/23/2014   PLT 200 10/23/2014    Recent Labs Lab 10/23/14 0435  NA 133*  K 3.3*  CL 99*  CO2 22  BUN 65*  CREATININE 2.56*  CALCIUM 8.3*  PROT 5.9*  BILITOT 1.2  ALKPHOS 148*  ALT 242*  AST 307*  GLUCOSE 97   Lab Results  Component Value Date   TROPONINI 0.07* 10/21/2014    Lab Results  Component Value Date   CHOL 90 10/22/2014   No results found for: HDL No results found for: [facility] Lab Results  Component Value Date   TRIG 78 10/22/2014   No results found for: CHOLHDL No results found for: LDLDIRECT    Radiology: Dg Chest 1 View  10/22/2014   CLINICAL DATA:  Post thoracentesis.  EXAM: CHEST  1 VIEW  COMPARISON:  10/21/2014  FINDINGS: Improved aeration at the left lung base compatible with recent thoracentesis. No evidence for a pneumothorax. Again noted is enlargement of the heart with median sternotomy wires and cardiac ICD leads. Persistent densities at the right lung base are compatible with right pleural fluid and consolidation/atelectasis.  IMPRESSION: Improved aeration in the left lung following a left thoracentesis. Negative for  pneumothorax.  Persistent right basilar densities are compatible with pleural fluid and consolidation/volume loss.   Electronically Signed   By: Markus Daft M.D.   On: 10/22/2014 15:07   Dg Chest 2 View  10/21/2014   CLINICAL DATA:  Dyspnea.  EXAM: CHEST  2 VIEW  COMPARISON:  October 16, 2014.  FINDINGS: Stable cardiomegaly. No pneumothorax is noted. Right-sided pacemaker is unchanged in position. Stable moderate bilateral pleural effusions are noted with probable underlying edema or atelectasis. Atherosclerosis of thoracic aorta is noted. No significant osseous abnormality is noted.  IMPRESSION: Moderate bilateral pleural effusions are noted with probable underlying edema or atelectasis.   Electronically Signed   By: Marijo Conception, M.D.   On: 10/21/2014 12:40   Dg Sniff Test  09/25/2014   CLINICAL DATA:  History of valvular heart disease and coronary artery disease status post valve repair and replacement and stent placement ; left mastectomy in 1971, bilateral pleural effusions.  EXAM: CHEST FLUOROSCOPY  TECHNIQUE: Real-time fluoroscopic evaluation of the chest was performed.  RADIOPHARMACEUTICALS:  None  FLUOROSCOPY TIME:  0 minutes, 30 seconds, 2 spot films obtained.  COMPARISON:  None.  FINDINGS: The lungs are adequately inflated the permanent pacemaker defibrillator is in reasonable position radiographically. There are 6 intact sternal wires. There are moderate-sized bilateral pleural effusions. The motion of the diaphragms is symmetrical. During the cough and sniff maneuver is appropriate diaphragmatic motion is demonstrated.  IMPRESSION: Normal diaphragmatic motion on this study. There are bilateral pleural effusions.   Electronically Signed   By: David  Martinique M.D.   On: 09/25/2014 10:26   Nm Hepato W/eject Fract  10/22/2014   CLINICAL DATA:  Abdominal pain, nausea, gallbladder wall thickening on ultrasound  EXAM: NUCLEAR MEDICINE HEPATOBILIARY IMAGING WITH GALLBLADDER EF  TECHNIQUE: Sequential  images of the abdomen were obtained out to 60 minutes following intravenous administration of radiopharmaceutical. After slow intravenous infusion of 1.02 micrograms Cholecystokinin, gallbladder ejection fraction was determined.  RADIOPHARMACEUTICALS:  5.43 mCi Tc-81m Choletec IV  COMPARISON:  Right upper quadrant sound dated 10/21/2014  FINDINGS: Normal hepatic excretion.  Visualization of the gallbladder and small bowel, confirming cystic and common duct patency.  Normal gallbladder ejection fraction of 82%. At 45 min, normal ejection fraction is greater than 40%.  IMPRESSION: Patent cystic and common duct.  Normal gallbladder ejection fraction following CCK infusion.   Electronically Signed   By: Julian Hy M.D.   On: 10/22/2014 17:44   Dg Abd Acute W/chest  10/16/2014   CLINICAL DATA:  Subacute onset of shortness of breath and nausea. Lower abdominal pain. Nonproductive cough. Initial encounter.  EXAM: DG ABDOMEN ACUTE W/ 1V CHEST  COMPARISON:  Chest radiograph performed 05/05/2010, and CT of the abdomen and pelvis performed 10/23/2009  FINDINGS: The lungs are well-aerated. Small bilateral pleural effusions are seen, with bibasilar airspace opacities, likely reflecting atelectasis. Underlying peribronchial thickening and mild vascular congestion are seen. No pneumothorax is identified. The cardiomediastinal silhouette is mildly enlarged. The patient is status post median sternotomy. A pacemaker/AICD is noted overlying the right chest wall, with leads ending overlying the right atrium and right ventricle.  The visualized bowel gas pattern is unremarkable. Scattered stool and air are seen within the colon; there is no evidence of small bowel dilatation to suggest obstruction. No free intra-abdominal air is identified on the provided upright view.  No acute osseous abnormalities are seen; the sacroiliac joints are unremarkable in appearance. Right convex lumbar scoliosis is noted.  IMPRESSION: 1.  Unremarkable bowel gas pattern; no free intra-abdominal air seen. Small amount of stool noted in the colon. 2. Small bilateral pleural effusions, with bibasilar airspace opacities, likely reflecting atelectasis. Underlying peribronchial thickening and mild vascular congestion seen. Mild cardiomegaly noted. 3. Right convex lumbar scoliosis noted.   Electronically Signed   By: Garald Balding M.D.   On: 10/16/2014 22:32   US Abdomen Limited Ruq  10/21/2014   CLINICAL DATA:  81 year old female with intermittent abdominal pain, abnormal LFTs. Initial encounter.  EXAM: US ABDOMEN LIMITED - RIGHT UPPER QUADRANT  COMPARISON:  CT Abdomen and Pelvis 10/23/2009.  FINDINGS: Gallbladder:  Generalize gallbladder wall thickening and heterogeneity, measuring up to 5-6 mm (image 15). No echogenic sludge or stones identified. Superimposed pericholecystic fluid suspected. No sonographic Murphy sign elicited.  Common bile duct:  Diameter: 2 mm, normal  Liver:  No focal lesion identified. Within normal limits in parenchymal echogenicity.  Other findings: Negative visible right kidney.  IMPRESSION: 1. Diffuse at abnormal gallbladder wall thickening. No sludge or stones identified. Top differential considerations include acute acalculous cholecystitis, and chronic cholecystitis. Nuclear medicine hepatobiliary scan may be able to differentiate further. 2. No biliary obstruction.   Electronically Signed   By: Genevie Ann M.D.   On: 10/21/2014 16:01   US Thoracentesis Asp Pleural Space W/img Guide  10/22/2014   CLINICAL DATA:  Bilateral pleural effusions  EXAM: ULTRASOUND GUIDED left THORACENTESIS  PROCEDURE: An ultrasound guided thoracentesis was thoroughly discussed with the patient and questions answered. The benefits, risks, alternatives and complications were also discussed. The patient understands and wishes to proceed with the procedure. Written consent was obtained.  Ultrasound was performed to localize and mark an adequate pocket  of fluid in the left chest. The area was then prepped and draped in the normal sterile fashion. 1% Lidocaine was used for local anesthesia. Under ultrasound guidance a 6 French thoracentesis catheter was introduced. Thoracentesis was performed. The catheter was removed and a dressing applied.  COMPLICATIONS: None  FINDINGS: A total of approximately 500 mL of clear yellow fluid was removed. A fluid sample was sent for laboratory analysis.  IMPRESSION: Successful ultrasound guided left thoracentesis yielding 500 mL of pleural fluid.   Electronically Signed   By: Inez Catalina M.D.   On: 10/22/2014 16:00    EKG: Atrial fibrillation  ASSESSMENT AND PLAN:    1. Chronic systolic congestive heart failure, clinically unchanged, currently on Lasix therapy 2. Ischemic dilated cardiomyopathy 3. Chronic atrial fibrillation 4. Bilateral pleural effusions  Recommendations  1. Agree with overall current therapy 2. Continue diuresis 3. Overweight right thoracentesis 4. Resume warfarin following thoracentesis 5. Further recommendations pending patient's clinical course following thoracentesis  Signed: Gabreal Worton MD,PhD, [facility] 10/23/2014, 8:28 AM

## 2014-10-23 NOTE — Evaluation (Signed)
Physical Therapy Evaluation Patient Details Name: Sheryl Hughes MRN: [id number] DOB: 1931-04-01 Today's Date: 10/23/2014   History of Present Illness  Patient is a very pleasant 81 y/o female that presents with increasing shortness of breath and decreased activity level over the last several weeks.   Clinical Impression  Patient is a very pleasant 81 y/o female that underwent thoracentesis yesterday to help alleviate her shortness of breath. Patient displays minimal assistance needed for sit to supine, but is otherwise able to perform bed mobility, gait and transfers without external assistance. She does require min Ax1 to transfer from a low toilet, though her toilet at home is higher. She does require multiple rest breaks secondary to deconditioning. At this time her biggest deficit is in her stamina. Both the patient and her family would prefer rehab as she was beginning to have difficulty navigating the household and completing ADLs. At this time patient appears to be at her "baseline" or roughly where she was prior to being admitted to the hospital, though this is concerning as she was beginning to have difficulty completing ADLs. If patient does not go to rehab she will benefit from HHPT and 24/7 assistance for ADL management. Of note she did begin to stumble while ambulating after she fatigued, though this improved after a rest break. Patient instructed to use RW when she fatigues. Skilled acute PT services are indicated to address her mobility deficits.     Follow Up Recommendations Home health PT, will need 24/7 assistance/supervision for mobility.     Equipment Recommendations       Recommendations for Other Services       Precautions / Restrictions Precautions Precautions: Fall Restrictions Weight Bearing Restrictions: No      Mobility  Bed Mobility Overal bed mobility: Needs Assistance Bed Mobility: Supine to Sit;Sit to Supine     Supine to sit: Supervision;HOB  elevated Sit to supine: Min assist   General bed mobility comments: Patient instructed in log roll technique for sit to supine requiring very minimal assistance to bring legs over threshold into bed. Patient able to transfer supine to sit from [facility] elevated with hand rails with no cuing or support.   Transfers Overall transfer level: Needs assistance   Transfers: Sit to/from Stand Sit to Stand: Supervision         General transfer comment: Patient does not lose balance, but is slightly slow in transferring sit to stand.   Ambulation/Gait Ambulation/Gait assistance: Min guard Ambulation Distance (Feet): 190 Feet   Gait Pattern/deviations: WFL(Within Functional Limits)   Gait velocity interpretation: Below normal speed for age/gender General Gait Details: Patient displays short step lengths, though this appears to be her baseline. No reports of falls. Patient requires multiple rest breaks due to decreased oxygenation/fatigue.   Stairs            Wheelchair Mobility    Modified Rankin (Stroke Patients Only)       Balance Overall balance assessment: Needs assistance Sitting-balance support: No upper extremity supported Sitting balance-Leahy Scale: Good     Standing balance support: No upper extremity supported Standing balance-Leahy Scale: Fair Standing balance comment: Short step length noted decreasing her gait speed. Modified DGI score of 10/12 indicating she is not at high risk of falling.                              Pertinent Vitals/Pain Pain Assessment:  (Patient does not comment on any  pain, just shortness of breath.)    Home Living Family/patient expects to be discharged to:: Private residence Living Arrangements: Spouse/significant other Available Help at Discharge: Family Type of Home: House Home Access: Stairs to enter Entrance Stairs-Rails: Can reach both Entrance Stairs-Number of Steps: [place]: One level [place]: Walker  - 2 wheels      Prior Function Level of Independence: Needs assistance   Gait / Transfers Assistance Needed: Independent for short distances.   ADL's / Homemaking Assistance Needed: Patient had been reliant on spouse, daughter, or aid?  Comments: Patient had been ambulating independently for short distances recently but has had difficulty with ADLs, requiring the assistance of an aid recently.      Hand Dominance        Extremity/Trunk Assessment   Upper Extremity Assessment: Overall WFL for tasks assessed           Lower Extremity Assessment: Overall WFL for tasks assessed (Hip flexors 4/5 bilaterally )         Communication   Communication: No difficulties  Cognition Arousal/Alertness: Awake/alert Behavior During Therapy: WFL for tasks assessed/performed Overall Cognitive Status: Within Functional Limits for tasks assessed                      General Comments      Exercises        Assessment/Plan    PT Assessment Patient needs continued PT services  PT Diagnosis Difficulty walking;Generalized weakness   PT Problem List Decreased strength;Cardiopulmonary status limiting activity;Decreased activity tolerance;Decreased balance;Decreased mobility  PT Treatment Interventions DME instruction;Gait training;Balance training;Therapeutic activities;Therapeutic exercise;Neuromuscular re-education   PT Goals (Current goals can be found in the Care Plan section) Acute Rehab PT Goals Patient Stated Goal: To go to rehab if she needs to.  PT Goal Formulation: With patient/family Time For Goal Achievement: 11/06/14 Potential to Achieve Goals: Good    Frequency Min 2X/week   Barriers to discharge Decreased caregiver support Her husband is not able to care for her to the level patient and daughter feel he may need to.     Co-evaluation               End of Session Equipment Utilized During Treatment: Gait belt Activity Tolerance: Patient tolerated  treatment well;Patient limited by fatigue Patient left: in bed;with call bell/phone within reach;with chair alarm set;with family/visitor present Nurse Communication: Mobility status         Time: [id number] PT Time Calculation (min) (ACUTE ONLY): 29 min   Charges:   PT Evaluation $Initial PT Evaluation Tier I: 1 Procedure PT Treatments $Therapeutic Activity: 8-22 mins   PT G Codes:       Kerman Passey, PT, DPT    10/23/2014, 2:35 PM

## 2014-10-23 NOTE — Progress Notes (Signed)
Md notified about pts HR going up, still Afib, will continue to assess

## 2014-10-23 NOTE — Consult Note (Signed)
  GI Inpatient Follow-up Note  Patient Identification: Sheryl Hughes is a 81 y.o. female  Subjective: Breathing better this AM after thoracentesis. HIDA scan negative. LFT similar. No worse. Tylenol level wnl. Other liver serologies pending.  Scheduled Inpatient Medications:  . diltiazem  10 mg Intravenous Once  . furosemide  40 mg Intravenous Q12H  . metoprolol tartrate  25 mg Oral BID  . pantoprazole  40 mg Oral BID  . sodium chloride  3 mL Intravenous Q12H  . warfarin  2.5 mg Oral Once per day on Sun Tue Wed Thu Sat  . warfarin  2.5 mg Oral ONCE-1800  . warfarin  5 mg Oral Once per day on Mon Fri  . Warfarin - Pharmacist Dosing Inpatient   Does not apply q1800    Continuous Inpatient Infusions:     PRN Inpatient Medications:  sodium chloride, acetaminophen **OR** acetaminophen, albuterol, ondansetron **OR** ondansetron (ZOFRAN) IV, sodium chloride, technetium TC 66M mebrofenin  Review of Systems: Constitutional: Weight is stable.  Eyes: No changes in vision. ENT: No oral lesions, sore throat.  GI: see HPI.  Heme/Lymph: No easy bruising.  CV: No chest pain.  GU: No hematuria.  Integumentary: No rashes.  Neuro: No headaches.  Psych: No depression/anxiety.  Endocrine: No heat/cold intolerance.  Allergic/Immunologic: No urticaria.  Resp: No cough, SOB.  Musculoskeletal: No joint swelling.    Physical Examination: BP 102/64 mmHg  Pulse 121  Temp(Src) 98.2 F (36.8 C) (Oral)  Resp 18  Ht 5\' 4"  (1.626 m)  Wt 48.943 kg (107 lb 14.4 oz)  BMI 18.51 kg/m2  SpO2 97% Gen: NAD, alert and oriented x 4 HEENT: PEERLA, EOMI, Neck: supple, no JVD or thyromegaly Chest: CTA bilaterally, no wheezes, crackles, or other adventitious sounds CV: Decreased breath sounds, no m/g/c/r Abd: soft, Minimal tenderness in RUQ area, ND, +BS in all four quadrants; no HSM, guarding, ridigity, or rebound tenderness Ext: no edema, well perfused with 2+ pulses, Skin: no rash or lesions  noted Lymph: no LAD  Data: Lab Results  Component Value Date   WBC 8.3 10/23/2014   HGB 10.3* 10/23/2014   HCT 31.8* 10/23/2014   MCV 82.5 10/23/2014   PLT 200 10/23/2014    Recent Labs Lab 10/21/14 1116 10/22/14 0543 10/23/14 0435  HGB 11.2* 10.5* 10.3*   Lab Results  Component Value Date   NA 133* 10/23/2014   K 3.3* 10/23/2014   CL 99* 10/23/2014   CO2 22 10/23/2014   BUN 65* 10/23/2014   CREATININE 2.56* 10/23/2014   Lab Results  Component Value Date   ALT 242* 10/23/2014   AST 307* 10/23/2014   ALKPHOS 148* 10/23/2014   BILITOT 1.2 10/23/2014    Recent Labs Lab 10/22/14 0543 10/23/14 0435  APTT 35  --   INR 2.85 2.28   Assessment/Plan: Ms. Iiams is a 81 y.o. female with LFT elevation. Likely hepatocellular process.  Recommendations: Continue to moniter LFT. Await liver serologies. Will follow.  Please call with questions or concerns.  Jeniah Kishi, Lupita Dawn, MD

## 2014-10-23 NOTE — Progress Notes (Signed)
[facility] NAME: Sheryl Hughes    MR#:  [id number]  DATE OF BIRTH:  September 23, 1931  SUBJECTIVE:  CHIEF COMPLAINT:   Chief Complaint  Patient presents with  . Shortness of Breath  feels somewhat better, daughter demanding getting right-sided thoracentesis also, also wanting to get liver workup REVIEW OF SYSTEMS:  Review of Systems  Constitutional: Negative for fever, weight loss, malaise/fatigue and diaphoresis.  HENT: Negative for ear discharge, ear pain, hearing loss, nosebleeds, sore throat and tinnitus.   Eyes: Negative for blurred vision and pain.  Respiratory: Negative for cough, hemoptysis, shortness of breath and wheezing.   Cardiovascular: Negative for chest pain, palpitations, orthopnea and leg swelling.  Gastrointestinal: Positive for nausea. Negative for heartburn, vomiting, abdominal pain, diarrhea, constipation and blood in stool.  Genitourinary: Negative for dysuria, urgency and frequency.  Musculoskeletal: Negative for myalgias and back pain.  Skin: Negative for itching and rash.  Neurological: Negative for dizziness, tingling, tremors, focal weakness, seizures, weakness and headaches.  Psychiatric/Behavioral: Negative for depression. The patient is not nervous/anxious.    DRUG ALLERGIES:   Allergies  Allergen Reactions  . Biaxin [Clarithromycin] Other (See Comments)    Numb lips  . Flagyl [Metronidazole] Other (See Comments)    Numb lips  . Penicillins Rash  . Tetracyclines & Related Rash   VITALS:  Blood pressure 99/61, pulse 89, temperature 97.7 F (36.5 C), temperature source Oral, resp. rate 16, height 5\' 4"  (1.626 m), weight 48.943 kg (107 lb 14.4 oz), SpO2 97 %. PHYSICAL EXAMINATION:  Physical Exam  Constitutional: She is oriented to person, place, and time and well-developed, well-nourished, and in no distress.  HENT:  Head: Normocephalic and atraumatic.  Eyes: Conjunctivae and EOM are normal.  Pupils are equal, round, and reactive to light.  Neck: Normal range of motion. Neck supple. No tracheal deviation present. No thyromegaly present.  Cardiovascular: Normal rate, regular rhythm and normal heart sounds.   Pulmonary/Chest: Effort normal and breath sounds normal. No respiratory distress. She has no wheezes. She exhibits no tenderness.  Abdominal: Soft. Bowel sounds are normal. She exhibits no distension. There is no tenderness.  Musculoskeletal: Normal range of motion.  Neurological: She is alert and oriented to person, place, and time. No cranial nerve deficit.  Skin: Skin is warm and dry. No rash noted.  Psychiatric: Mood and affect normal.    LABORATORY PANEL:   CBC  Recent Labs Lab 10/23/14 0435  WBC 8.3  HGB 10.3*  HCT 31.8*  PLT 200   ------------------------------------------------------------------------------------------------------------------ Chemistries   Recent Labs Lab 10/21/14 1842  10/23/14 0435  NA 132*  < > 133*  K 5.8*  < > 3.3*  CL 99*  < > 99*  CO2 18*  < > 22  GLUCOSE 111*  < > 97  BUN 62*  < > 65*  CREATININE 3.19*  < > 2.56*  CALCIUM 9.1  < > 8.3*  MG 2.4  --   --   AST  --   < > 307*  ALT  --   < > 242*  ALKPHOS  --   < > 148*  BILITOT  --   < > 1.2  < > = values in this interval not displayed. RADIOLOGY:  Nm Hepato W/eject Fract  10/22/2014   CLINICAL DATA:  Abdominal pain, nausea, gallbladder wall thickening on ultrasound  EXAM: NUCLEAR MEDICINE HEPATOBILIARY IMAGING WITH GALLBLADDER EF  TECHNIQUE: Sequential images of the abdomen were  obtained out to 60 minutes following intravenous administration of radiopharmaceutical. After slow intravenous infusion of 1.02 micrograms Cholecystokinin, gallbladder ejection fraction was determined.  RADIOPHARMACEUTICALS:  5.43 mCi Tc-46m Choletec IV  COMPARISON:  Right upper quadrant sound dated 10/21/2014  FINDINGS: Normal hepatic excretion.  Visualization of the gallbladder and small bowel,  confirming cystic and common duct patency.  Normal gallbladder ejection fraction of 82%. At 45 min, normal ejection fraction is greater than 40%.  IMPRESSION: Patent cystic and common duct.  Normal gallbladder ejection fraction following CCK infusion.   Electronically Signed   By: Julian Hy M.D.   On: 10/22/2014 17:44   ASSESSMENT AND PLAN:   * Bilateral moderate pleural effusion: Appreciate Pulmo and cardio c/s.  Status post left-sided thoracentesis with about 500 cc of fluid removal.  We will go and order right-sided thoracentesis per daughter request.  This is likely from underlying CHF  * Acute on chronic systolic CHF with ejection fraction 15-20%: continue IV lasix, she is neg 658 cc thus far. Follows with Dr Saralyn Pilar, appreciate input  * Acute renal failure on CKD 4: could be due to cardio-renal syndrome, nephro input appreciated  * Hyperkalemia/Hyponatremia: Improving, given kayexalate once  * Elevated troponin: due to supply demand ischemia, due to renal failure. Monitor on tele, cardio c/s  * Abnormal liver function test: monitor, Abd US shows Diffuse gallbladder wall thickening worrisome for acute acalculous cholecystitis, and chronic cholecystitis.  Normal HIDA scan. GI input appreciated, hepatitis panel ordered  * A. Fib: rate is controlled. continue Lopressor and continue Coumadin, follow-up INR and PTT, pharmacy to manage coumadin   * CAD: monitor on tele, cardio c/s  Will get PT eval - likely benefit from placement. Family is insisting on it.  PT is recommending home health.  Daughter is not very happy about same as she was looking for rehabilitation placement.  All the records are reviewed and case discussed with Care Management/Social Worker. Management plans discussed with the patient, family and they are in agreement.  CODE STATUS: Full Code  TOTAL TIME TAKING CARE OF THIS PATIENT: 35 minutes.   More than 50% of the time was spent in counseling/coordination of  care: YES (discussed with family)  POSSIBLE D/C IN 1-2 DAYS, DEPENDING ON CLINICAL CONDITION.   [facility], Dawnya Grams M.D on 10/23/2014 at 5:15 PM  Between 7am to 6pm - Pager - [phone]  After 6pm go to [email or web address] - password [email or web address] [facility]  Office  [phone]  CC: Primary care physician; Kirk Ruths., MD

## 2014-10-23 NOTE — Progress Notes (Signed)
PT Cancellation Note  Patient Details Name: Sheryl Hughes MRN: [id number] DOB: Jan 15, 1932   Cancelled Treatment:     PT asked telemetry nurse to monitor patient's HR during mobility evaluation. She informed PT that patient had HR at 136 currently. PT entered the room to find patient alert and very pleasant in her chair. However upon inspection, her HR was bouncing between 120s-140s. PT notified RN who was in room with PT that this was too high for mobility evaluation as this was approaching her age-predicted HR max at rest. RN will contact Cardiology for further management. PT will re-attempt mobility evaluation as patient is more appropriate for activity.    Kerman Passey, PT, DPT    10/23/2014, 9:46 AM

## 2014-10-23 NOTE — Discharge Instructions (Signed)
Heart Failure Clinic appointment on November 07, 2014 at 11:00am with Darylene Price, [place]. Please call [phone] to reschedule.

## 2014-10-23 NOTE — Progress Notes (Signed)
ANTICOAGULATION CONSULT NOTE - Initial Consult  Pharmacy Consult for warfarin Indication: atrial fibrillation  Allergies  Allergen Reactions  . Biaxin [Clarithromycin] Other (See Comments)    Numb lips  . Flagyl [Metronidazole] Other (See Comments)    Numb lips  . Penicillins Rash  . Tetracyclines & Related Rash    Patient Measurements: Height: 5\' 4"  (162.6 cm) Weight: 107 lb 14.4 oz (48.943 kg) IBW/kg (Calculated) : 54.7   Vital Signs: Temp: 98.2 F (36.8 C) (08/16 0408) Temp Source: Oral (08/16 0408) BP: 98/64 mmHg (08/16 0408) Pulse Rate: 121 (08/16 0408)  Labs:  Recent Labs  10/21/14 1116 10/21/14 1842 10/21/14 2027 10/22/14 0543 10/23/14 0435  HGB 11.2*  --   --  10.5* 10.3*  HCT 34.8*  --   --  32.4* 31.8*  PLT 240  --   --  199 200  APTT  --   --   --  35  --   LABPROT  --   --  31.2* 30.0* 25.3*  INR  --   --  3.00 2.85 2.28  CREATININE 2.96* 3.19*  --  3.30* 2.56*  TROPONINI 0.07*  --   --   --   --     Estimated Creatinine Clearance: 13.5 mL/min (by C-G formula based on Cr of 2.56).   Medical History: Past Medical History  Diagnosis Date  . Atrial fibrillation   . CHF (congestive heart failure)   . Shingles   . CAD (coronary artery disease)   . Pulmonary hypertension   . GERD (gastroesophageal reflux disease)   . CKD (chronic kidney disease)     Medications:  Prescriptions prior to admission  Medication Sig Dispense Refill Last Dose  . acetaminophen (TYLENOL) 325 MG tablet Take 325 mg by mouth every 6 (six) hours as needed for mild pain, moderate pain or fever.    Past Month at Unknown time  . aspirin EC 81 MG tablet Take 81 mg by mouth daily.   10/16/2014  . atorvastatin (LIPITOR) 20 MG tablet Take 20 mg by mouth at bedtime.    10/20/2014 at Unknown time  . Calcium Carb-Cholecalciferol (CALCIUM 600 + D PO) Take 1 tablet by mouth daily.     . Cholecalciferol 2000 UNITS TABS Take 1 tablet by mouth daily.   10/21/2014 at Unknown time  .  docusate sodium (COLACE) 100 MG capsule Take 100 mg by mouth 2 (two) times daily.   10/21/2014 at Unknown time  . dronedarone (MULTAQ) 400 MG tablet Take 400 mg by mouth 2 (two) times daily with a meal.   10/21/2014 at Unknown time  . furosemide (LASIX) 40 MG tablet Take 40 mg by mouth 2 (two) times daily.    10/21/2014 at Unknown time  . metoprolol succinate (TOPROL-XL) 25 MG 24 hr tablet Take 25 mg by mouth 2 (two) times daily.   10/21/2014 at 1000  . omeprazole (PRILOSEC) 20 MG capsule Take 20 mg by mouth daily.   10/20/2014 at Unknown time  . potassium chloride (MICRO-K) 10 MEQ CR capsule Take 10 mEq by mouth 2 (two) times daily.    10/21/2014 at Unknown time  . vitamin B-12 (CYANOCOBALAMIN) 1000 MCG tablet Take 1,000 mcg by mouth daily.   10/21/2014 at Unknown time  . warfarin (COUMADIN) 5 MG tablet Take 2.5-5 mg by mouth See admin instructions. Take 5mg  tablet orally once a day Monday and Friday. Take 1/2 tablet (2.5mg ) orally on Sunday, Tuesday, Wednesday, Thursday, and Saturday.   10/16/2014  .  polyethylene glycol (MIRALAX / GLYCOLAX) packet Take 17 g by mouth daily. 30 each 0   . polyethylene glycol powder (MIRALAX) powder Take 17 g by mouth daily. 255 g 0     Assessment: Pharmacy consulted to dose warfarin for afib in this 81 year old female. Warfarin held on admission for thoracentesis. Procedure performed today, per MD will resume warfarin this evening.   Regimen PTA:  Warfarin 2.5mg  on Sun, Tues, Wed, Thurs, Sat Warfarin 5mg  on Monday and Friday  8/14 - INR: 3.0, warfarin held 8/15 - INR: 2.85  Goal of Therapy:  INR 2-3   Plan:  Will give coumadin 2.5mg  today and resume home regimen starting tomorrow. Will follow daily INR.  Pharmacy to follow per consult  [phone] INR 2.28 - continue current regimen. Will check INR again tomorrow AM.   Charlann Lange. Jordan Hawks, PharmD Clinical Pharmacist  10/23/2014,5:51 AM

## 2014-10-23 NOTE — Progress Notes (Signed)
ANTICOAGULATION CONSULT NOTE - FOLLOW UP  Consult  Pharmacy Consult for warfarin Indication: atrial fibrillation  Allergies  Allergen Reactions  . Biaxin [Clarithromycin] Other (See Comments)    Numb lips  . Flagyl [Metronidazole] Other (See Comments)    Numb lips  . Penicillins Rash  . Tetracyclines & Related Rash    Patient Measurements: Height: 5\' 4"  (162.6 cm) Weight: 107 lb 14.4 oz (48.943 kg) IBW/kg (Calculated) : 54.7   Vital Signs: Temp: 97.7 F (36.5 C) (08/16 1155) Temp Source: Oral (08/16 1155) BP: 92/60 mmHg (08/16 1155) Pulse Rate: 82 (08/16 1155)  Labs:  Recent Labs  10/21/14 1116 10/21/14 1842  10/22/14 0543 10/23/14 0435 10/23/14 1145  HGB 11.2*  --   --  10.5* 10.3*  --   HCT 34.8*  --   --  32.4* 31.8*  --   PLT 240  --   --  199 200  --   APTT  --   --   --  35  --   --   LABPROT  --   --   < > 30.0* 25.3* 21.9*  INR  --   --   < > 2.85 2.28 1.89  CREATININE 2.96* 3.19*  --  3.30* 2.56*  --   TROPONINI 0.07*  --   --   --   --   --   < > = values in this interval not displayed.  Estimated Creatinine Clearance: 13.5 mL/min (by C-G formula based on Cr of 2.56).   Medical History: Past Medical History  Diagnosis Date  . Atrial fibrillation   . CHF (congestive heart failure)   . Shingles   . CAD (coronary artery disease)   . Pulmonary hypertension   . GERD (gastroesophageal reflux disease)   . CKD (chronic kidney disease)     Medications:  Prescriptions prior to admission  Medication Sig Dispense Refill Last Dose  . acetaminophen (TYLENOL) 325 MG tablet Take 325 mg by mouth every 6 (six) hours as needed for mild pain, moderate pain or fever.    Past Month at Unknown time  . aspirin EC 81 MG tablet Take 81 mg by mouth daily.   10/16/2014  . atorvastatin (LIPITOR) 20 MG tablet Take 20 mg by mouth at bedtime.    10/20/2014 at Unknown time  . Calcium Carb-Cholecalciferol (CALCIUM 600 + D PO) Take 1 tablet by mouth daily.     .  Cholecalciferol 2000 UNITS TABS Take 1 tablet by mouth daily.   10/21/2014 at Unknown time  . docusate sodium (COLACE) 100 MG capsule Take 100 mg by mouth 2 (two) times daily.   10/21/2014 at Unknown time  . dronedarone (MULTAQ) 400 MG tablet Take 400 mg by mouth 2 (two) times daily with a meal.   10/21/2014 at Unknown time  . furosemide (LASIX) 40 MG tablet Take 40 mg by mouth 2 (two) times daily.    10/21/2014 at Unknown time  . metoprolol succinate (TOPROL-XL) 25 MG 24 hr tablet Take 25 mg by mouth 2 (two) times daily.   10/21/2014 at 1000  . omeprazole (PRILOSEC) 20 MG capsule Take 20 mg by mouth daily.   10/20/2014 at Unknown time  . potassium chloride (MICRO-K) 10 MEQ CR capsule Take 10 mEq by mouth 2 (two) times daily.    10/21/2014 at Unknown time  . vitamin B-12 (CYANOCOBALAMIN) 1000 MCG tablet Take 1,000 mcg by mouth daily.   10/21/2014 at Unknown time  . warfarin (COUMADIN) 5  MG tablet Take 2.5-5 mg by mouth See admin instructions. Take 5mg  tablet orally once a day Monday and Friday. Take 1/2 tablet (2.5mg ) orally on Sunday, Tuesday, Wednesday, Thursday, and Saturday.   10/16/2014  . polyethylene glycol (MIRALAX / GLYCOLAX) packet Take 17 g by mouth daily. 30 each 0   . polyethylene glycol powder (MIRALAX) powder Take 17 g by mouth daily. 255 g 0     Assessment: Pharmacy consulted to dose warfarin for afib in this 81 year old female. Warfarin held on admission for thoracentesis. Procedure performed today, per MD will resume warfarin this evening.   Regimen PTA:  Warfarin 2.5mg  on Sun, Tues, Wed, Thurs, Sat Warfarin 5mg  on Monday and Friday  8/14 - INR: 3.0, warfarin held 8/15 - INR: 2.85  Goal of Therapy:  INR 2-3   Plan:  Will give coumadin 2.5mg  today and resume home regimen starting tomorrow. Will follow daily INR.  Pharmacy to follow per consult  [id number] INR 2.28 - continue current regimen. Will check INR again tomorrow AM. Repeat INR resulted @ 1.89. Will give a one time dose of  warfarin 4 mg PO x 1 and will recheck with am labs.    Larene Beach, PharmD  Clinical Pharmacist  10/23/2014,12:58 PM

## 2014-10-24 ENCOUNTER — Encounter
Admission: RE | Admit: 2014-10-24 | Discharge: 2014-10-24 | Disposition: A | Discharge status: Home / Self Care | Payer: Medicare Other | Source: Ambulatory Visit | Attending: Internal Medicine | Admitting: Internal Medicine

## 2014-10-24 ENCOUNTER — Inpatient Hospital Stay: Payer: Medicare Other

## 2014-10-24 LAB — PROTEIN ELECTROPHORESIS, SERUM
A/G Ratio: 1.3 (ref 0.7–1.7)
ALPHA-1-GLOBULIN: 0.3 g/dL (ref 0.0–0.4)
Albumin ELP: 3.1 g/dL (ref 2.9–4.4)
Alpha-2-Globulin: 0.7 g/dL (ref 0.4–1.0)
Beta Globulin: 0.8 g/dL (ref 0.7–1.3)
GAMMA GLOBULIN: 0.6 g/dL (ref 0.4–1.8)
Globulin, Total: 2.3 g/dL (ref 2.2–3.9)
TOTAL PROTEIN ELP: 5.4 g/dL — AB (ref 6.0–8.5)

## 2014-10-24 LAB — HEPATITIS PANEL, ACUTE
HEP A IGM: NEGATIVE
HEP B C IGM: NEGATIVE
HEP B S AG: NEGATIVE

## 2014-10-24 LAB — BASIC METABOLIC PANEL
ANION GAP: 10 (ref 5–15)
BUN: 52 mg/dL — ABNORMAL HIGH (ref 6–20)
CO2: 25 mmol/L (ref 22–32)
Calcium: 8 mg/dL — ABNORMAL LOW (ref 8.9–10.3)
Chloride: 98 mmol/L — ABNORMAL LOW (ref 101–111)
Creatinine, Ser: 1.71 mg/dL — ABNORMAL HIGH (ref 0.44–1.00)
GFR, EST AFRICAN AMERICAN: 31 mL/min — AB (ref >60)
GFR, EST NON AFRICAN AMERICAN: 27 mL/min — AB (ref >60)
GLUCOSE: 88 mg/dL (ref 65–99)
POTASSIUM: 4.1 mmol/L (ref 3.5–5.1)
Sodium: 133 mmol/L — ABNORMAL LOW (ref 135–145)

## 2014-10-24 LAB — ANA COMPREHENSIVE PANEL
Anti JO-1: <0.2 AI (ref 0.0–0.9)
Chromatin Ab SerPl-aCnc: <0.2 AI (ref 0.0–0.9)
DS DNA AB: 5 [IU]/mL (ref 0–9)
ENA SM Ab Ser-aCnc: <0.2 AI (ref 0.0–0.9)
RIBONUCLEIC PROTEIN: 0.5 AI (ref 0.0–0.9)
SSA (Ro) (ENA) Antibody, IgG: <0.2 AI (ref 0.0–0.9)
SSB (La) (ENA) Antibody, IgG: <0.2 AI (ref 0.0–0.9)
Scleroderma (Scl-70) (ENA) Antibody, IgG: <0.2 AI (ref 0.0–0.9)

## 2014-10-24 LAB — ANA W/REFLEX: Anti Nuclear Antibody(ANA): NEGATIVE

## 2014-10-24 LAB — PH, BODY FLUID: pH, Body Fluid: 7.6

## 2014-10-24 LAB — PROTIME-INR
INR: 1.77
Prothrombin Time: 20.8 seconds — ABNORMAL HIGH (ref 11.4–15.0)

## 2014-10-24 LAB — CYTOLOGY - NON PAP

## 2014-10-24 LAB — VITAMIN D 25 HYDROXY (VIT D DEFICIENCY, FRACTURES): Vit D, 25-Hydroxy: 55.2 ng/mL (ref 30.0–100.0)

## 2014-10-24 LAB — ANTI-SMOOTH MUSCLE ANTIBODY, IGG: F-ACTIN AB IGG: 26 U — AB (ref 0–19)

## 2014-10-24 LAB — PATHOLOGIST SMEAR REVIEW

## 2014-10-24 LAB — PARATHYROID HORMONE, INTACT (NO CA): PTH: 75 pg/mL — AB (ref 15–65)

## 2014-10-24 MED ORDER — POTASSIUM CHLORIDE 10 MEQ/100ML IV SOLN
10.0000 meq | INTRAVENOUS | Status: DC
  Filled 2014-10-24 (×4): qty 100

## 2014-10-24 MED ORDER — POLYETHYLENE GLYCOL 3350 17 G PO PACK
17.0000 g | PACK | Freq: Every day | ORAL | Status: DC
  Administered 2014-10-24 – 2014-10-25 (×2): 17 g via ORAL
  Filled 2014-10-24 (×2): qty 1

## 2014-10-24 MED ORDER — WARFARIN SODIUM 4 MG PO TABS
4.0000 mg | ORAL_TABLET | Freq: Once | ORAL | Status: AC
  Administered 2014-10-24: 4 mg via ORAL
  Filled 2014-10-24: qty 1

## 2014-10-24 NOTE — Progress Notes (Signed)
[facility]  ROUNDING NOTE   Subjective:  Pt sitting up in chair this AM. Breathing comfortably. States LE edema slightly less prominent.    Objective:  Vital signs in last 24 hours:  Temp:  [97.7 F (36.5 C)-98 F (36.7 C)] 97.7 F (36.5 C) (08/17 1049) Pulse Rate:  [82-113] 82 (08/17 1049) Resp:  [16-20] 16 (08/17 1049) BP: (91-99)/(50-66) 95/56 mmHg (08/17 1049) SpO2:  [97 %-100 %] 98 % (08/17 1049) Weight:  [48.263 kg (106 lb 6.4 oz)] 48.263 kg (106 lb 6.4 oz) (08/17 0336)  Weight change: -0.68 kg (-1 lb 8 oz) Filed Weights   10/22/14 0459 10/23/14 0408 10/24/14 0336  Weight: 50.803 kg (112 lb) 48.943 kg (107 lb 14.4 oz) 48.263 kg (106 lb 6.4 oz)    Intake/Output: I/O last 3 completed shifts: In: 963 [P.O.:960; I.V.:3] Out: 2325 [Urine:2325]   Intake/Output this shift:  Total I/O In: 240 [P.O.:240] Out: 400 [Urine:400]  Physical Exam: General: NAD, sitting up in chair  Head: Normocephalic, atraumatic. Moist oral mucosal membranes  Eyes: Anicteric  Neck: Supple, trachea midline  Lungs:  Minimal basilar rales, normale effort  Heart: S1S2 no rubs  Abdomen:  Soft, nontender, BS present   Extremities: 2+ peripheral edema.  Neurologic: Nonfocal, moving all four extremities  Skin: No lesions       Basic Metabolic Panel:  Recent Labs Lab 10/21/14 1116 10/21/14 1842 10/22/14 0543 10/23/14 0435 10/23/14 1711  NA 132* 132* 134* 133*  --   K 5.7* 5.8* 4.5 3.3* 3.4*  CL 95* 99* 101 99*  --   CO2 21* 18* 21* 22  --   GLUCOSE 138* 111* 117* 97  --   BUN 62* 62* 69* 65*  --   CREATININE 2.96* 3.19* 3.30* 2.56*  --   CALCIUM 9.2 9.1 8.7* 8.3*  --   MG  --  2.4  --   --  2.2    Liver Function Tests:  Recent Labs Lab 10/21/14 1116 10/22/14 0543 10/23/14 0435  AST 277* 370* 307*  ALT 159* 226* 242*  ALKPHOS 130* 132* 148*  BILITOT 1.9* 1.5* 1.2  PROT 7.2 5.5* 5.9*  ALBUMIN 3.8 3.1* 3.1*    Recent Labs Lab 10/22/14 1816  AMYLASE 53    No results for input(s): AMMONIA in the last 168 hours.  CBC:  Recent Labs Lab 10/21/14 1116 10/22/14 0543 10/23/14 0435  WBC 9.6 9.3 8.3  HGB 11.2* 10.5* 10.3*  HCT 34.8* 32.4* 31.8*  MCV 83.3 82.8 82.5  PLT 240 199 200    Cardiac Enzymes:  Recent Labs Lab 10/21/14 1116  TROPONINI 0.07*    BNP: Invalid input(s): POCBNP  CBG: No results for input(s): GLUCAP in the last 168 hours.  Microbiology: Results for orders placed or performed during the hospital encounter of 10/21/14  Body fluid culture     Status: None (Preliminary result)   Collection Time: 10/22/14  2:25 PM  Result Value Ref Range Status   Specimen Description FLUID  Final   Special Requests NONE  Final   Gram Stain RARE WBC SEEN NO ORGANISMS SEEN   Final   Culture NO GROWTH 2 DAYS  Final   Report Status PENDING  Incomplete    Coagulation Studies:  Recent Labs  10/21/14 2027 10/22/14 0543 10/23/14 0435 10/23/14 1145 10/24/14 0449  LABPROT 31.2* 30.0* 25.3* 21.9* 20.8*  INR 3.00 2.85 2.28 1.89 1.77    Urinalysis:  Recent Labs  10/21/14 [place]  LABSPEC 1.014  PHURINE 5.0  GLUCOSEU NEGATIVE  HGBUR NEGATIVE  BILIRUBINUR NEGATIVE  KETONESUR NEGATIVE  PROTEINUR 100*  NITRITE NEGATIVE  LEUKOCYTESUR TRACE*      Imaging: Dg Chest 1 View  10/22/2014   CLINICAL DATA:  Post thoracentesis.  EXAM: CHEST  1 VIEW  COMPARISON:  10/21/2014  FINDINGS: Improved aeration at the left lung base compatible with recent thoracentesis. No evidence for a pneumothorax. Again noted is enlargement of the heart with median sternotomy wires and cardiac ICD leads. Persistent densities at the right lung base are compatible with right pleural fluid and consolidation/atelectasis.  IMPRESSION: Improved aeration in the left lung following a left thoracentesis. Negative for pneumothorax.  Persistent right basilar densities are compatible with pleural fluid and consolidation/volume loss.    Electronically Signed   By: Markus Daft M.D.   On: 10/22/2014 15:07   Nm Hepato W/eject Fract  10/22/2014   CLINICAL DATA:  Abdominal pain, nausea, gallbladder wall thickening on ultrasound  EXAM: NUCLEAR MEDICINE HEPATOBILIARY IMAGING WITH GALLBLADDER EF  TECHNIQUE: Sequential images of the abdomen were obtained out to 60 minutes following intravenous administration of radiopharmaceutical. After slow intravenous infusion of 1.02 micrograms Cholecystokinin, gallbladder ejection fraction was determined.  RADIOPHARMACEUTICALS:  5.43 mCi Tc-40mCholetec IV  COMPARISON:  Right upper quadrant sound dated 10/21/2014  FINDINGS: Normal hepatic excretion.  Visualization of the gallbladder and small bowel, confirming cystic and common duct patency.  Normal gallbladder ejection fraction of 82%. At 45 min, normal ejection fraction is greater than 40%.  IMPRESSION: Patent cystic and common duct.  Normal gallbladder ejection fraction following CCK infusion.   Electronically Signed   By: SJulian HyM.D.   On: 10/22/2014 17:44   UKoreaThoracentesis Asp Pleural Space W/img Guide  10/22/2014   CLINICAL DATA:  Bilateral pleural effusions  EXAM: ULTRASOUND GUIDED left THORACENTESIS  PROCEDURE: An ultrasound guided thoracentesis was thoroughly discussed with the patient and questions answered. The benefits, risks, alternatives and complications were also discussed. The patient understands and wishes to proceed with the procedure. Written consent was obtained.  Ultrasound was performed to localize and mark an adequate pocket of fluid in the left chest. The area was then prepped and draped in the normal sterile fashion. 1% Lidocaine was used for local anesthesia. Under ultrasound guidance a 6 French thoracentesis catheter was introduced. Thoracentesis was performed. The catheter was removed and a dressing applied.  COMPLICATIONS: None  FINDINGS: A total of approximately 500 mL of clear yellow fluid was removed. A fluid sample was  sent for laboratory analysis.  IMPRESSION: Successful ultrasound guided left thoracentesis yielding 500 mL of pleural fluid.   Electronically Signed   By: MInez CatalinaM.D.   On: 10/22/2014 16:00     Medications:     . furosemide  40 mg Intravenous Q12H  . metoprolol tartrate  25 mg Oral BID  . pantoprazole  40 mg Oral BID  . sodium chloride  3 mL Intravenous Q12H  . warfarin  2.5 mg Oral Once per day on Sun Tue Wed Thu Sat  . warfarin  4 mg Oral ONCE-1800  . warfarin  5 mg Oral Once per day on Mon Fri  . Warfarin - Pharmacist Dosing Inpatient   Does not apply q1800   sodium chloride, acetaminophen **OR** acetaminophen, albuterol, ondansetron **OR** ondansetron (ZOFRAN) IV, sodium chloride, technetium TC 60M mebrofenin  Assessment/ Plan:  81y.o. female female with a PMHx of chronic systolic heart faiulre EF 15%, atrial fibrillation, coronary  artery disease, CKD stage III baseline Cr 1.03 with egfr of 56, pulmonary hypertension, coronary artery disease s/p CABG, history of pacemaker placement,who was admitted to [facility] on 10/21/2014 for evaluation of shortness of breath.  1. Acute renal failure/CKD stage III baseline Cr 1.03/proteinuria egfr of 56. Suspect acute renal failure now due to abnormal cardiorenal hemodynamics. Cr peaked at 3.3. -Will follow up renal function this AM, awaiting renal US this AM.  Ok to continue lasix for now.   2. Hyponatremia: likely due to hypervolemia, awiating new serum Na this AM.   3. Hypokalemia: will give KCL 74mq IV today to avoid dysrhythmias.  4. Acute systolic heart failure exacerbation: clinically improved, continue lasix 445mIV q12 hours.   LOS: 3 Berit Raczkowski 8/17/201611:41 AM

## 2014-10-24 NOTE — Clinical Social Work Note (Signed)
Clinical Social Work Assessment  Patient Details  Name: Sheryl Hughes MRN: [id number] Date of Birth: Jul 08, 1931  Date of referral:  10/23/14               Reason for consult:  Facility Placement                Permission sought to share information with:  Facility Sport and exercise psychologist, Family Supports Permission granted to share information::  Yes, Verbal Permission Granted  Name::        Agency::     Relationship::     Contact Information:     Housing/Transportation Living arrangements for the past 2 months:   (home) Source of Information:  Patient, Adult Children Patient Interpreter Needed:  None Criminal Activity/Legal Involvement Pertinent to Current Situation/Hospitalization:  No - Comment as needed Significant Relationships:  Adult Children Lives with:  Self Do you feel safe going back to the place where you live?  Yes Need for family participation in patient care:  Yes (Comment)  Care giving concerns:  Lives alone   Facilities manager / plan:  See below  Employment status:  Retired Forensic scientist:  Medicare PT Recommendations:  [place] / Referral to community resources:     Patient/Family's Response to care:  Very flexible and understanding.  Patient/Family's Understanding of and Emotional Response to Diagnosis, Current Treatment, and Prognosis:  CSW completed assessment yesterday afternoon with daughter and patient. Patient lives alone and patient's daughter lives locally and can provide additional support in the home for patient if needed. They are aware of PT's recommendation for 24 hour supervision and they wish to proceed with rehab. Bedsearch initiated last evening.   Emotional Assessment Appearance:  Appears stated age Attitude/Demeanor/Rapport:   (pleasant and cooperative) Affect (typically observed):  Accepting, Adaptable, Appropriate, Hopeful Orientation:  Oriented to Self, Oriented to Place, Oriented to  Time,  Oriented to Situation Alcohol / Substance use:  Not Applicable Psych involvement (Current and /or in the community):  No (Comment)  Discharge Needs  Concerns to be addressed:  Care Coordination Readmission within the last 30 days:  No Current discharge risk:  None Barriers to Discharge:  No Barriers Identified   Shela Leff, LCSW 10/24/2014, 11:08 AM

## 2014-10-24 NOTE — Procedures (Signed)
Interventional Radiology Procedure Note  Procedure:  Ultrasound guided right thoracentesis  Complications:  None  Estimated Blood Loss:  None  750 mL of clear, yellow fluid removed.  Post CXR pending.  Venetia Night. Kathlene Cote, M.D Pager:  [phone]

## 2014-10-24 NOTE — Consult Note (Signed)
  Pt not in room. Went for thoracentesis and renal U/S. Daughter in room. Belching better. No LFT today but INR improving, which may be a good sign for someone with liver condition. Liver serologies are still pending. Pt constipated. No BM recently. Hemorrhoids are bothering her a lot.  Start miralax daily. Once pt has BM, then can order H.pylori stool Ag. Will check back tomorrow. thanks

## 2014-10-24 NOTE — Progress Notes (Signed)
[facility] [place] NAME: Sheryl Hughes    MR#:  [id number]  DATE OF BIRTH:  03/29/1931  SUBJECTIVE:  CHIEF COMPLAINT:   Chief Complaint  Patient presents with  . Shortness of Breath  sitting in chair, feels much better status post right-sided thoracentesis, family at bedside REVIEW OF SYSTEMS:  Review of Systems  Constitutional: Positive for malaise/fatigue. Negative for fever, weight loss and diaphoresis.  HENT: Negative for ear discharge, ear pain, hearing loss, nosebleeds, sore throat and tinnitus.   Eyes: Negative for blurred vision and pain.  Respiratory: Negative for cough, hemoptysis, shortness of breath and wheezing.   Cardiovascular: Negative for chest pain, palpitations, orthopnea and leg swelling.  Gastrointestinal: Negative for heartburn, nausea, vomiting, abdominal pain, diarrhea, constipation and blood in stool.  Genitourinary: Negative for dysuria, urgency and frequency.  Musculoskeletal: Negative for myalgias and back pain.  Skin: Negative for itching and rash.  Neurological: Positive for weakness. Negative for dizziness, tingling, tremors, focal weakness, seizures and headaches.  Psychiatric/Behavioral: Negative for depression. The patient is not nervous/anxious.    DRUG ALLERGIES:   Allergies  Allergen Reactions  . Biaxin [Clarithromycin] Other (See Comments)    Numb lips  . Flagyl [Metronidazole] Other (See Comments)    Numb lips  . Penicillins Rash  . Tetracyclines & Related Rash   VITALS:  Blood pressure 92/62, pulse 84, temperature 97.7 F (36.5 C), temperature source Oral, resp. rate 20, height 5\' 4"  (1.626 m), weight 48.263 kg (106 lb 6.4 oz), SpO2 93 %. PHYSICAL EXAMINATION:  Physical Exam  Constitutional: She is oriented to person, place, and time and well-developed, well-nourished, and in no distress.  HENT:  Head: Normocephalic and atraumatic.  Eyes: Conjunctivae and EOM are normal. Pupils are  equal, round, and reactive to light.  Neck: Normal range of motion. Neck supple. No tracheal deviation present. No thyromegaly present.  Cardiovascular: Normal rate, regular rhythm and normal heart sounds.   Pulmonary/Chest: Effort normal and breath sounds normal. No respiratory distress. She has no wheezes. She exhibits no tenderness.  Abdominal: Soft. Bowel sounds are normal. She exhibits no distension. There is no tenderness.  Musculoskeletal: Normal range of motion.  Neurological: She is alert and oriented to person, place, and time. No cranial nerve deficit.  Skin: Skin is warm and dry. No rash noted.  Psychiatric: Mood and affect normal.    LABORATORY PANEL:   CBC  Recent Labs Lab 10/23/14 0435  WBC 8.3  HGB 10.3*  HCT 31.8*  PLT 200   ------------------------------------------------------------------------------------------------------------------ Chemistries   Recent Labs Lab 10/23/14 0435 10/23/14 1711 10/24/14 0449  NA 133*  --  133*  K 3.3* 3.4* 4.1  CL 99*  --  98*  CO2 22  --  25  GLUCOSE 97  --  88  BUN 65*  --  52*  CREATININE 2.56*  --  1.71*  CALCIUM 8.3*  --  8.0*  MG  --  2.2  --   AST 307*  --   --   ALT 242*  --   --   ALKPHOS 148*  --   --   BILITOT 1.2  --   --    RADIOLOGY:  Dg Chest 1 View  10/24/2014   CLINICAL DATA:  Status post right thoracentesis today.  EXAM: CHEST  1 VIEW  COMPARISON:  Single view of the chest 10/23/2014.  FINDINGS: Right pleural effusion seen on the comparison study is markedly decreased in  size after thoracentesis. No pneumothorax is identified. Small left pleural effusion and basilar airspace disease appear unchanged. There is cardiomegaly without edema. AICD is in place.  IMPRESSION: Marked decrease in a right pleural effusion after thoracentesis. Negative for pneumothorax.  No change in a left pleural effusion and basilar airspace disease.   Electronically Signed   By: Inge Rise M.D.   On: 10/24/2014 12:50    US Renal  10/24/2014   CLINICAL DATA:  Acute renal failure  EXAM: RENAL / URINARY TRACT ULTRASOUND COMPLETE  COMPARISON:  October 23, 2009 CT abdomen and pelvis  FINDINGS: Right Kidney:  Length: 9.6 cm. Echogenicity and renal cortical thickness are within normal limits. No perinephric fluid or hydronephrosis visualized. There is a mildly lobulated cyst arising from the upper pole the right kidney measuring 0.6 x 1.3 x 1.6 cm. No sonographically demonstrable calculus or ureterectasis.  Left Kidney:  Length: 9.4 cm. Echogenicity and renal cortical thickness are within normal limits. No perinephric fluid or hydronephrosis visualized. There is a cyst arising from the upper pole of the left kidney measuring 5.3 x 4.8 x 5.8 cm. There is a cyst nearby measuring 1.8 x 1.3 x 1.2 cm. No sonographically demonstrable calculus or ureterectasis.  Bladder:  Appears normal for degree of bladder distention.  IMPRESSION: Cysts in each kidney with a dominant cyst arising from the upper pole left kidney, a finding present previously. No obstructing foci identified in either kidney. The renal echogenicity is within normal limits bilaterally. Renal cortical thickness is toward the lower limits of normal but is felt to be within normal limits.   Electronically Signed   By: Lowella Grip III M.D.   On: 10/24/2014 11:47   US Thoracentesis Asp Pleural Space W/img Guide  10/24/2014   CLINICAL DATA:  CHF and right pleural effusion. Status post left thoracentesis on 10/22/2014.  EXAM: ULTRASOUND GUIDED RIGHT THORACENTESIS  COMPARISON:  None.  PROCEDURE: An ultrasound guided thoracentesis was thoroughly discussed with the patient and questions answered. The benefits, risks, alternatives and complications were also discussed. The patient understands and wishes to proceed with the procedure. Written consent was obtained.  Ultrasound was performed to localize and mark an adequate pocket of fluid in the right posterior chest. The area was  then prepped and draped in the normal sterile fashion. 1% Lidocaine was used for local anesthesia. Under ultrasound guidance a 6 French Safe-T-Centesis catheter was introduced. Thoracentesis was performed. The catheter was removed and a dressing applied.  COMPLICATIONS: None  FINDINGS: A total of approximately 750 mL of clear, yellowish fluid was removed.  IMPRESSION: Successful ultrasound guided right thoracentesis yielding 750 mL of pleural fluid.   Electronically Signed   By: Aletta Edouard M.D.   On: 10/24/2014 15:30   ASSESSMENT AND PLAN:   * Bilateral moderate pleural effusion: Appreciate Pulmo and cardio c/s.  Status post left and right sided thoracentesis likely from underlying CHF.  At 500-750 cc of fluid removed from each side  * Acute on chronic systolic CHF with ejection fraction 15-20%: continue IV lasix, she is neg 2.2 liters thus far. Follows with Dr Saralyn Pilar, appreciate input  * Acute renal failure on CKD 4: could be due to cardio-renal syndrome, nephro input appreciated, creatinine improved to 2.56->1.71  * Hyperkalemia/Hyponatremia: Improving, given kayexalate once  * Elevated troponin: due to supply demand ischemia, due to renal failure. Monitor on tele  * Abnormal liver function test: monitor, Abd US showed Diffuse gallbladder wall thickening worrisome for acute acalculous  cholecystitis, and chronic cholecystitis.  Normal HIDA scan. GI input appreciated, hepatitis panel ordered, which can take while for result, we will recheck liver function tests in the morning  * A. Fib: rate is controlled. continue Lopressor and continue Coumadin, follow-up INR and PTT, pharmacy managing coumadin   * CAD: monitor on tele  PT recommends home health with 24-hour assistance -social worker is considering rehabilitation placement for request from family as She has a very demented husband and home  All the records are reviewed and case discussed with Care Management/Social Worker. Management  plans discussed with the patient, family and they are in agreement.  CODE STATUS: Full Code  TOTAL TIME TAKING CARE OF THIS PATIENT: 35 minutes.   More than 50% of the time was spent in counseling/coordination of care: YES (discussed with family)  POSSIBLE D/C IN 1-2 DAYS, DEPENDING ON CLINICAL CONDITION.   [facility], Kristen Fromm M.D on 10/24/2014 at 3:44 PM  Between 7am to 6pm - Pager - [phone]  After 6pm go to [email or web address] - password [email or web address] [facility]  Office  [phone]  CC: Primary care physician; Kirk Ruths., MD

## 2014-10-24 NOTE — Progress Notes (Signed)
Date: 10/24/2014,   MRN# [id number] Sheryl Hughes 1931/05/10 Code Status:     Code Status Orders        Start     Ordered   10/21/14 1820  Full code   Continuous     10/21/14 1819    Advance Directive Documentation        Most Recent Value   Type of Advance Directive  Living will   Pre-existing out of facility DNR order (yellow form or pink MOST form)     "MOST" Form in Place?       Hosp day:@LENGTHOFSTAYDAYS @ Referring MD: @ATDPROV @        HPI: Feeling much better since she had bilateral thoracentesis done. Transudate. She is aware the fluid will return.   Marland Kitchen    PMHX:   Past Medical History  Diagnosis Date  . Atrial fibrillation   . CHF (congestive heart failure)   . Shingles   . CAD (coronary artery disease)   . Pulmonary hypertension   . GERD (gastroesophageal reflux disease)   . CKD (chronic kidney disease)    Surgical Hx:  Past Surgical History  Procedure Laterality Date  . Mastectomy    . Cardiac catheterization    . Back surgery    . Eye surgery    . Thoracentesis Bilateral   . Pacemaker placement      defibrillator  . Mitral valve repair    . Coronary artery bypass graft     Family Hx:  Family History  Problem Relation Age of Onset  . Heart attack Mother   . Heart attack Father   . COPD Father   . Ulcers Father   . Atrial fibrillation Sister   . Lung cancer Sister   . Heart attack Brother   . Aortic stenosis Brother   . CAD Brother    Social Hx:   Social History  Substance Use Topics  . Smoking status: Never Smoker   . Smokeless tobacco: None  . Alcohol Use: No   Medication:    Home Medication:  No current outpatient prescriptions on file.  Current Medication: @CURMEDTAB @   Allergies:  Biaxin; Flagyl; Penicillins; and Tetracyclines & related  Review of Systems: Gen:  Denies  fever, sweats, chills HEENT: Denies blurred vision, double vision, ear pain, eye pain, hearing loss, nose bleeds, sore throat Cvc:  No dizziness, chest pain  or heaviness Resp: much improved sob, post thoracetesis, no pleurisy, hemoptysis   Gi: Denies swallowing difficulty, stomach pain, nausea or vomiting, diarrhea, constipation, bowel incontinence Gu:  Denies bladder incontinence, burning urine Ext:   No Joint pain, stiffness or swelling Skin: No skin rash, easy bruising or bleeding or hives Endoc:  No polyuria, polydipsia , polyphagia or weight change Psych: No depression, insomnia or hallucinations  Other:  All other systems negative  Physical Examination:   VS: BP 92/62 mmHg  Pulse 84  Temp(Src) 97.7 F (36.5 C) (Oral)  Resp 20  Ht 5\' 4"  (1.626 m)  Wt 106 lb 6.4 oz (48.263 kg)  BMI 18.25 kg/m2  SpO2 93%  General Appearance: No distress  Neuro/psych without focal findings, mental status, speech normal, alert and oriented, cranial nerves 2-12 intact, reflexes normal and symmetric, sensation grossly normal, very bright affect  HEENT: PERRLA, EOM intact, no ptosis, no other lesions noticed: Pulmonary:.No wheezing, No rales  Better aeration   Cardiovascular:  Normal S1,S2.  No m/r/g.      Abdomen:Benign, Soft, non-tender, No masses, hepatosplenomegaly, No lymphadenopathy Endoc:  No evident thyromegaly, no signs of acromegaly or Cushing features Skin:   warm, no rashes, no ecchymosis  Extremities: normal, no cyanosis, clubbing, no edema, warm with normal capillary refill. Other findings:   Labs results:   Recent Labs     10/22/14  0543  10/23/14  0435  10/23/14  1145  10/24/14  0449  HGB  10.5*  10.3*   --    --   HCT  32.4*  31.8*   --    --   MCV  82.8  82.5   --    --   WBC  9.3  8.3   --    --   BUN  69*  65*   --   52*  CREATININE  3.30*  2.56*   --   1.71*  GLUCOSE  117*  97   --   88  CALCIUM  8.7*  8.3*   --   8.0*  INR  2.85  2.28  1.89  1.77  ,    Rad results:   Dg Chest 1 View  10/24/2014   CLINICAL DATA:  Status post right thoracentesis today.  EXAM: CHEST  1 VIEW  COMPARISON:  Single view of the chest  10/23/2014.  FINDINGS: Right pleural effusion seen on the comparison study is markedly decreased in size after thoracentesis. No pneumothorax is identified. Small left pleural effusion and basilar airspace disease appear unchanged. There is cardiomegaly without edema. AICD is in place.  IMPRESSION: Marked decrease in a right pleural effusion after thoracentesis. Negative for pneumothorax.  No change in a left pleural effusion and basilar airspace disease.   Electronically Signed   By: Inge Rise M.D.   On: 10/24/2014 12:50   US Renal  10/24/2014   CLINICAL DATA:  Acute renal failure  EXAM: RENAL / URINARY TRACT ULTRASOUND COMPLETE  COMPARISON:  October 23, 2009 CT abdomen and pelvis  FINDINGS: Right Kidney:  Length: 9.6 cm. Echogenicity and renal cortical thickness are within normal limits. No perinephric fluid or hydronephrosis visualized. There is a mildly lobulated cyst arising from the upper pole the right kidney measuring 0.6 x 1.3 x 1.6 cm. No sonographically demonstrable calculus or ureterectasis.  Left Kidney:  Length: 9.4 cm. Echogenicity and renal cortical thickness are within normal limits. No perinephric fluid or hydronephrosis visualized. There is a cyst arising from the upper pole of the left kidney measuring 5.3 x 4.8 x 5.8 cm. There is a cyst nearby measuring 1.8 x 1.3 x 1.2 cm. No sonographically demonstrable calculus or ureterectasis.  Bladder:  Appears normal for degree of bladder distention.  IMPRESSION: Cysts in each kidney with a dominant cyst arising from the upper pole left kidney, a finding present previously. No obstructing foci identified in either kidney. The renal echogenicity is within normal limits bilaterally. Renal cortical thickness is toward the lower limits of normal but is felt to be within normal limits.   Electronically Signed   By: Lowella Grip III M.D.   On: 10/24/2014 11:47   US Thoracentesis Asp Pleural Space W/img Guide  10/24/2014   CLINICAL DATA:  CHF and  right pleural effusion. Status post left thoracentesis on 10/22/2014.  EXAM: ULTRASOUND GUIDED RIGHT THORACENTESIS  COMPARISON:  None.  PROCEDURE: An ultrasound guided thoracentesis was thoroughly discussed with the patient and questions answered. The benefits, risks, alternatives and complications were also discussed. The patient understands and wishes to proceed with the procedure. Written consent was obtained.  Ultrasound was performed  to localize and mark an adequate pocket of fluid in the right posterior chest. The area was then prepped and draped in the normal sterile fashion. 1% Lidocaine was used for local anesthesia. Under ultrasound guidance a 6 French Safe-T-Centesis catheter was introduced. Thoracentesis was performed. The catheter was removed and a dressing applied.  COMPLICATIONS: None  FINDINGS: A total of approximately 750 mL of clear, yellowish fluid was removed.  IMPRESSION: Successful ultrasound guided right thoracentesis yielding 750 mL of pleural fluid.   Electronically Signed   By: Aletta Edouard M.D.   On: 10/24/2014 15:30      Assessment and Plan: Dyspnea on exertion: [facility], due to cardiomyopathy, EF 15-20 %, bilateral pleural effusions (most likely cardiac in origin, transudate), +/- deconditioned, . Elevated LFTS, ? Passive congestion, ? Liver dz, gallbladder etc. Renal insufficiency  -continue watching salt, fluid intake -diuretics -following cardiology recs -out patient follow up with repeat chest xray in 2 weeks  Cardiomyopathy, ef 15 % -following cardiology recs    I have personally obtained a history, examined the patient, evaluated laboratory and imaging results, formulated the assessment and plan and placed orders.  The Patient requires high complexity decision making for assessment and support, frequent evaluation and titration of therapies, application of advanced monitoring technologies and extensive interpretation of multiple databases.   Kenyan Karnes,M.D. Pulmonary & Critical care Medicine [facility]

## 2014-10-24 NOTE — Care Management Important Message (Signed)
Important Message  Patient Details  Name: Sheryl Hughes MRN: [id number] Date of Birth: 1931/11/15   Medicare Important Message Given:  Yes-third notification given    [name] 10/24/2014, 10:28 AM

## 2014-10-24 NOTE — Care Management (Signed)
CM Follow up-  Patient currently one 2 L O2 which is a new requirement.  If patient needs O2 at time of discharge qualifying O2 sats and diagnosis will be needed.  PT to work with patient today.  RN cm to follow for discharge planning.

## 2014-10-24 NOTE — Progress Notes (Signed)
PT Cancellation Note  Patient Details Name: Sheryl Hughes MRN: [id number] DOB: [birth year]   Cancelled Treatment:    Reason Eval/Treat Not Completed: Patient at procedure or test/unavailable Will re-attempt later in day if possible    Candace Ramus 10/24/2014, 11:07 AM Sadira Standard, PTA

## 2014-10-24 NOTE — Progress Notes (Signed)
Physical Therapy Treatment Patient Details Name: Sheryl Hughes MRN: [id number] DOB: 08-13-1931 Today's Date: 10/24/2014    History of Present Illness Patient is a very pleasant 81 y/o female that presents with increasing shortness of breath and decreased activity level over the last several weeks.     PT Comments    Pt requires constant cueing for breathing and pacing.  Monitored SpO2 prior to ambulation initially 88% but after instruction for breathing techniques increased to 95%.  Pt ambulated approx 100' with frequent therapeutic rest breaks, SpO2 would drop to mid 80's but resolve with standing rest.  Pt ambulated with slow cadence then indicated feeling light headed.  Upon checking stats O2 50-60s, nsg notified, supplemental O2 applied and required pt to rest in chair.  SpO2 increased to mid 90's wheeled pt to room and returned to bed.  Nsg present and checked vitals.  Discussed with dgt recommendation to d/c to SNF for further conditioning.   Follow Up Recommendations  Home health PT     Equipment Recommendations       Recommendations for Other Services       Precautions / Restrictions Restrictions Weight Bearing Restrictions: No    Mobility  Bed Mobility Overal bed mobility: Needs Assistance Bed Mobility: Sit to Supine     Supine to sit: Supervision;Min guard     General bed mobility comments: Provided assist to bed due to decreased SpO2  Transfers Overall transfer level: Needs assistance   Transfers: Sit to/from Stand Sit to Stand: Supervision         General transfer comment: Cues for PHP  Ambulation/Gait Ambulation/Gait assistance: Min guard Ambulation Distance (Feet): 120 Feet   Gait Pattern/deviations: WFL(Within Functional Limits)   Gait velocity interpretation: Below normal speed for age/gender General Gait Details: Step through pattern requiring frequent breaks    Stairs            Wheelchair Mobility    Modified Rankin (Stroke  Patients Only)       Balance Overall balance assessment: Needs assistance Sitting-balance support: No upper extremity supported Sitting balance-Leahy Scale: Good     Standing balance support: Bilateral upper extremity supported Standing balance-Leahy Scale: Good                      Cognition Arousal/Alertness: Awake/alert Behavior During Therapy: WFL for tasks assessed/performed Overall Cognitive Status: Within Functional Limits for tasks assessed                      Exercises      General Comments        Pertinent Vitals/Pain      Home Living                      Prior Function            PT Goals (current goals can now be found in the care plan section) Acute Rehab PT Goals PT Goal Formulation: With patient/family Time For Goal Achievement: 11/06/14 Potential to Achieve Goals: Good    Frequency  Min 2X/week    PT Plan      Co-evaluation             End of Session Equipment Utilized During Treatment: Gait belt;Oxygen Activity Tolerance: Patient tolerated treatment well;Treatment limited secondary to medical complications (Comment) Patient left: in bed;with call bell/phone within reach;with bed alarm set;with nursing/sitter in room;with family/visitor present     Time: 1427-1510 PT  Time Calculation (min) (ACUTE ONLY): 43 min  Charges:  $Gait Training: 8-22 mins $Therapeutic Activity: 23-37 mins                    G Codes:      Tallon Gertz 2014-10-31, 4:41 PM Satsuki Zillmer, PTA

## 2014-10-24 NOTE — Progress Notes (Signed)
ANTICOAGULATION CONSULT NOTE - FOLLOW UP  Consult  Pharmacy Consult for warfarin Indication: atrial fibrillation  Allergies  Allergen Reactions  . Biaxin [Clarithromycin] Other (See Comments)    Numb lips  . Flagyl [Metronidazole] Other (See Comments)    Numb lips  . Penicillins Rash  . Tetracyclines & Related Rash    Patient Measurements: Height: 5\' 4"  (162.6 cm) Weight: 106 lb 6.4 oz (48.263 kg) IBW/kg (Calculated) : 54.7   Vital Signs: Temp: 97.9 F (36.6 C) (08/17 0336) Temp Source: Oral (08/17 0336) BP: 94/56 mmHg (08/17 0336) Pulse Rate: 84 (08/17 0336)  Labs:  Recent Labs  10/21/14 1116 10/21/14 1842  10/22/14 0543 10/23/14 0435 10/23/14 1145 10/24/14 0449  HGB 11.2*  --   --  10.5* 10.3*  --   --   HCT 34.8*  --   --  32.4* 31.8*  --   --   PLT 240  --   --  199 200  --   --   APTT  --   --   --  35  --   --   --   LABPROT  --   --   < > 30.0* 25.3* 21.9* 20.8*  INR  --   --   < > 2.85 2.28 1.89 1.77  CREATININE 2.96* 3.19*  --  3.30* 2.56*  --   --   TROPONINI 0.07*  --   --   --   --   --   --   < > = values in this interval not displayed.  Estimated Creatinine Clearance: 13.4 mL/min (by C-G formula based on Cr of 2.56).   Medical History: Past Medical History  Diagnosis Date  . Atrial fibrillation   . CHF (congestive heart failure)   . Shingles   . CAD (coronary artery disease)   . Pulmonary hypertension   . GERD (gastroesophageal reflux disease)   . CKD (chronic kidney disease)     Medications:  Prescriptions prior to admission  Medication Sig Dispense Refill Last Dose  . acetaminophen (TYLENOL) 325 MG tablet Take 325 mg by mouth every 6 (six) hours as needed for mild pain, moderate pain or fever.    Past Month at Unknown time  . aspirin EC 81 MG tablet Take 81 mg by mouth daily.   10/16/2014  . atorvastatin (LIPITOR) 20 MG tablet Take 20 mg by mouth at bedtime.    10/20/2014 at Unknown time  . Calcium Carb-Cholecalciferol (CALCIUM 600 + D  PO) Take 1 tablet by mouth daily.     . Cholecalciferol 2000 UNITS TABS Take 1 tablet by mouth daily.   10/21/2014 at Unknown time  . docusate sodium (COLACE) 100 MG capsule Take 100 mg by mouth 2 (two) times daily.   10/21/2014 at Unknown time  . dronedarone (MULTAQ) 400 MG tablet Take 400 mg by mouth 2 (two) times daily with a meal.   10/21/2014 at Unknown time  . furosemide (LASIX) 40 MG tablet Take 40 mg by mouth 2 (two) times daily.    10/21/2014 at Unknown time  . metoprolol succinate (TOPROL-XL) 25 MG 24 hr tablet Take 25 mg by mouth 2 (two) times daily.   10/21/2014 at 1000  . omeprazole (PRILOSEC) 20 MG capsule Take 20 mg by mouth daily.   10/20/2014 at Unknown time  . potassium chloride (MICRO-K) 10 MEQ CR capsule Take 10 mEq by mouth 2 (two) times daily.    10/21/2014 at Unknown time  . vitamin  B-12 (CYANOCOBALAMIN) 1000 MCG tablet Take 1,000 mcg by mouth daily.   10/21/2014 at Unknown time  . warfarin (COUMADIN) 5 MG tablet Take 2.5-5 mg by mouth See admin instructions. Take 5mg  tablet orally once a day Monday and Friday. Take 1/2 tablet (2.5mg ) orally on Sunday, Tuesday, Wednesday, Thursday, and Saturday.   10/16/2014  . polyethylene glycol (MIRALAX / GLYCOLAX) packet Take 17 g by mouth daily. 30 each 0   . polyethylene glycol powder (MIRALAX) powder Take 17 g by mouth daily. 255 g 0     Assessment: Pharmacy consulted to dose warfarin for afib in this 81 year old female. Warfarin held on admission for thoracentesis. Procedure performed today, per MD will resume warfarin this evening.   Regimen PTA:  Warfarin 2.5mg  on Sun, Tues, Wed, Thurs, Sat Warfarin 5mg  on Monday and Friday  8/14 - INR: 3.0, warfarin held 8/15 - INR: 2.85  Goal of Therapy:  INR 2-3   Plan:  Will give coumadin 2.5mg  today and resume home regimen starting tomorrow. Will follow daily INR.  Pharmacy to follow per consult  [phone] INR 2.28 - continue current regimen. Will check INR again tomorrow AM. Repeat INR  resulted @ 1.89. Will give a one time dose of warfarin 4 mg PO x 1 and will recheck with am labs.    [id number] INR 1.77 will repeat 4 mg dose x 1 today and recheck INR in AM.   Ovid Curd A. Jordan Hawks, PharmD Clinical Pharmacist  10/24/2014,6:14 AM

## 2014-10-24 NOTE — Clinical Social Work Note (Signed)
Patient has had bed offers and they were extended and patient and daughter have chosen [facility]. CSW has notified [facility] of bed acceptance. Shela Leff MSW,LCSW [phone]

## 2014-10-25 LAB — COMPREHENSIVE METABOLIC PANEL
ALT: 143 U/L — AB (ref 14–54)
ANION GAP: 8 (ref 5–15)
AST: 112 U/L — ABNORMAL HIGH (ref 15–41)
Albumin: 2.6 g/dL — ABNORMAL LOW (ref 3.5–5.0)
Alkaline Phosphatase: 110 U/L (ref 38–126)
BUN: 35 mg/dL — ABNORMAL HIGH (ref 6–20)
CHLORIDE: 101 mmol/L (ref 101–111)
CO2: 29 mmol/L (ref 22–32)
Calcium: 8.2 mg/dL — ABNORMAL LOW (ref 8.9–10.3)
Creatinine, Ser: 1.18 mg/dL — ABNORMAL HIGH (ref 0.44–1.00)
GFR calc non Af Amer: 42 mL/min — ABNORMAL LOW (ref >60)
GFR, EST AFRICAN AMERICAN: 49 mL/min — AB (ref >60)
Glucose, Bld: 96 mg/dL (ref 65–99)
POTASSIUM: 3.5 mmol/L (ref 3.5–5.1)
SODIUM: 138 mmol/L (ref 135–145)
Total Bilirubin: 0.9 mg/dL (ref 0.3–1.2)
Total Protein: 5.2 g/dL — ABNORMAL LOW (ref 6.5–8.1)

## 2014-10-25 LAB — PROTIME-INR
INR: 1.97
Prothrombin Time: 22.6 seconds — ABNORMAL HIGH (ref 11.4–15.0)

## 2014-10-25 MED ORDER — FLEET ENEMA 7-19 GM/118ML RE ENEM: 1.0000 | ENEMA | Freq: Every day | RECTAL | Status: DC | PRN

## 2014-10-25 MED ORDER — FUROSEMIDE 40 MG PO TABS: 40.0000 mg | ORAL_TABLET | Freq: Two times a day (BID) | ORAL | Status: DC

## 2014-10-25 MED ORDER — DOCUSATE SODIUM 100 MG PO CAPS: 100.0000 mg | ORAL_CAPSULE | Freq: Two times a day (BID) | ORAL | Status: DC

## 2014-10-25 NOTE — Discharge Summary (Signed)
[place] [facility] [place] NAME: Sheryl Hughes    MR#:  [id number]  DATE OF BIRTH:  11/28/1931  DATE OF ADMISSION:  10/21/2014 ADMITTING PHYSICIAN: Demetrios Loll, MD  DATE OF DISCHARGE: 10/25/14  PRIMARY CARE PHYSICIAN: Kirk Ruths., MD    ADMISSION DIAGNOSIS:  Dehydration [E86.0] Hyperkalemia [E87.5] Pleural effusion [J90]  DISCHARGE DIAGNOSIS:  Acute on chronic CHF,systolic Right sided pleural effusion-Transudate s/p thoracentesis -elevated transaminases-improving f/u GI Chronic afib  SECONDARY DIAGNOSIS:   Past Medical History  Diagnosis Date  . Atrial fibrillation   . CHF (congestive heart failure)   . Shingles   . CAD (coronary artery disease)   . Pulmonary hypertension   . GERD (gastroesophageal reflux disease)   . CKD (chronic kidney disease)     HOSPITAL COURSE:   * Bilateral moderate pleural effusion: Appreciate Pulmo and cardio c/s. Status post left and right sided thoracentesis likely from underlying CHF. At 500-750 cc of fluid removed from each side  * Acute on chronic systolic CHF with ejection fraction 15-20%: -was on  IV lasix, she is neg 3.2 liters thus far. Follows with Dr Saralyn Pilar, appreciate input -change to po lasix sats >92% on RA. Use oxygen prn  * Acute renal failure on CKD 4: could be due to cardio-renal syndrome, nephro input appreciated, creatinine improved to 2.56->1.71  * Hyperkalemia/Hyponatremia: resolved  * Elevated troponin: due to supply demand ischemia, due to renal failure.no cp  * Abnormal liver function test: monitor, Abd US showed Diffuse gallbladder wall thickening worrisome for acute acalculous cholecystitis, and chronic cholecystitis. Normal HIDA scan. GI input appreciated, hepatitis panel neg. LFT's trending down. Pt will f/u with Dr Candace Cruise as outpt next [facility]  * A. Fib: rate is controlled. continue Lopressor and continue Coumadin, follow-up INR and PTT, pharmacy managing  coumadin   * CAD: monitor on tele Overall improved. D/c to [facility] today. D/w family in the room DISCHARGE CONDITIONS:   fair CONSULTS OBTAINED:  Treatment Team:  Anthonette Legato, MD Erby Pian, MD Isaias Cowman, MD  DRUG ALLERGIES:   Allergies  Allergen Reactions  . Biaxin [Clarithromycin] Other (See Comments)    Numb lips  . Flagyl [Metronidazole] Other (See Comments)    Numb lips  . Penicillins Rash  . Tetracyclines & Related Rash    DISCHARGE MEDICATIONS:   Current Discharge Medication List    CONTINUE these medications which have NOT CHANGED   Details  acetaminophen (TYLENOL) 325 MG tablet Take 325 mg by mouth every 6 (six) hours as needed for mild pain, moderate pain or fever.     aspirin EC 81 MG tablet Take 81 mg by mouth daily.    atorvastatin (LIPITOR) 20 MG tablet Take 20 mg by mouth at bedtime.     Calcium Carb-Cholecalciferol (CALCIUM 600 + D PO) Take 1 tablet by mouth daily.    Cholecalciferol 2000 UNITS TABS Take 1 tablet by mouth daily.    docusate sodium (COLACE) 100 MG capsule Take 100 mg by mouth 2 (two) times daily.    dronedarone (MULTAQ) 400 MG tablet Take 400 mg by mouth 2 (two) times daily with a meal.    furosemide (LASIX) 40 MG tablet Take 40 mg by mouth 2 (two) times daily.     metoprolol succinate (TOPROL-XL) 25 MG 24 hr tablet Take 25 mg by mouth 2 (two) times daily.    omeprazole (PRILOSEC) 20 MG capsule Take 20 mg by mouth daily.    potassium  chloride (MICRO-K) 10 MEQ CR capsule Take 10 mEq by mouth 2 (two) times daily.     vitamin B-12 (CYANOCOBALAMIN) 1000 MCG tablet Take 1,000 mcg by mouth daily.    warfarin (COUMADIN) 5 MG tablet Take 2.5-5 mg by mouth See admin instructions. Take 5mg  tablet orally once a day Monday and Friday. Take 1/2 tablet (2.5mg ) orally on Sunday, Tuesday, Wednesday, Thursday, and Saturday.    polyethylene glycol powder (MIRALAX) powder Take 17 g by mouth daily. Qty: 255 g, Refills: 0       STOP taking these medications     polyethylene glycol (MIRALAX / GLYCOLAX) packet         If you experience worsening of your admission symptoms, develop shortness of breath, life threatening emergency, suicidal or homicidal thoughts you must seek medical attention immediately by calling 911 or calling your MD immediately  if symptoms less severe.  You Must read complete instructions/literature along with all the possible adverse reactions/side effects for all the Medicines you take and that have been prescribed to you. Take any new Medicines after you have completely understood and accept all the possible adverse reactions/side effects.   Please note  You were cared for by a hospitalist during your hospital stay. If you have any questions about your discharge medications or the care you received while you were in the hospital after you are discharged, you can call the unit and asked to speak with the hospitalist on call if the hospitalist that took care of you is not available. Once you are discharged, your primary care physician will handle any further medical issues. Please note that NO REFILLS for any discharge medications will be authorized once you are discharged, as it is imperative that you return to your primary care physician (or establish a relationship with a primary care physician if you do not have one) for your aftercare needs so that they can reassess your need for medications and monitor your lab values. Today   SUBJECTIVE   Feels a lot better  VITAL SIGNS:  Blood pressure 89/56, pulse 62, temperature 97.8 F (36.6 C), temperature source Oral, resp. rate 18, height 5\' 4"  (1.626 m), weight 48.1 kg (106 lb 0.7 oz), SpO2 93 %.  I/O:   Intake/Output Summary (Last 24 hours) at 10/25/14 0949 Last data filed at 10/25/14 0800  Gross per 24 hour  Intake    240 ml  Output   1875 ml  Net  -1635 ml    PHYSICAL EXAMINATION:  GENERAL:  81 y.o.-year-old patient lying in the bed  with no acute distress.  EYES: Pupils equal, round, reactive to light and accommodation. No scleral icterus. Extraocular muscles intact.  HEENT: Head atraumatic, normocephalic. Oropharynx and nasopharynx clear.  NECK:  Supple, no jugular venous distention. No thyroid enlargement, no tenderness.  LUNGS: Normal breath sounds bilaterally, no wheezing, rales,rhonchi or crepitation. No use of accessory muscles of respiration.  CARDIOVASCULAR: S1, S2 normal. No murmurs, rubs, or gallops.  ABDOMEN: Soft, non-tender, non-distended. Bowel sounds present. No organomegaly or mass.  EXTREMITIES:++ pedal edema, cyanosis, or clubbing.  NEUROLOGIC: Cranial nerves II through XII are intact. Muscle strength 5/5 in all extremities. Sensation intact. Gait not checked.  PSYCHIATRIC: The patient is alert and oriented x 3.  SKIN: No obvious rash, lesion, or ulcer.   DATA REVIEW:   CBC   Recent Labs Lab 10/23/14 0435  WBC 8.3  HGB 10.3*  HCT 31.8*  PLT 200    Chemistries   Recent  Labs Lab 10/23/14 1711  10/25/14 0423  NA  --   < > 138  K 3.4*  < > 3.5  CL  --   < > 101  CO2  --   < > 29  GLUCOSE  --   < > 96  BUN  --   < > 35*  CREATININE  --   < > 1.18*  CALCIUM  --   < > 8.2*  MG 2.2  --   --   AST  --   --  112*  ALT  --   --  143*  ALKPHOS  --   --  110  BILITOT  --   --  0.9  < > = values in this interval not displayed.  Microbiology Results   Recent Results (from the past 240 hour(s))  Body fluid culture     Status: None (Preliminary result)   Collection Time: 10/22/14  2:25 PM  Result Value Ref Range Status   Specimen Description FLUID  Final   Special Requests NONE  Final   Gram Stain RARE WBC SEEN NO ORGANISMS SEEN   Final   Culture NO GROWTH 2 DAYS  Final   Report Status PENDING  Incomplete    RADIOLOGY:  Dg Chest 1 View  10/24/2014   CLINICAL DATA:  Status post right thoracentesis today.  EXAM: CHEST  1 VIEW  COMPARISON:  Single view of the chest 10/23/2014.   FINDINGS: Right pleural effusion seen on the comparison study is markedly decreased in size after thoracentesis. No pneumothorax is identified. Small left pleural effusion and basilar airspace disease appear unchanged. There is cardiomegaly without edema. AICD is in place.  IMPRESSION: Marked decrease in a right pleural effusion after thoracentesis. Negative for pneumothorax.  No change in a left pleural effusion and basilar airspace disease.   Electronically Signed   By: Inge Rise M.D.   On: 10/24/2014 12:50   US Renal  10/24/2014   CLINICAL DATA:  Acute renal failure  EXAM: RENAL / URINARY TRACT ULTRASOUND COMPLETE  COMPARISON:  October 23, 2009 CT abdomen and pelvis  FINDINGS: Right Kidney:  Length: 9.6 cm. Echogenicity and renal cortical thickness are within normal limits. No perinephric fluid or hydronephrosis visualized. There is a mildly lobulated cyst arising from the upper pole the right kidney measuring 0.6 x 1.3 x 1.6 cm. No sonographically demonstrable calculus or ureterectasis.  Left Kidney:  Length: 9.4 cm. Echogenicity and renal cortical thickness are within normal limits. No perinephric fluid or hydronephrosis visualized. There is a cyst arising from the upper pole of the left kidney measuring 5.3 x 4.8 x 5.8 cm. There is a cyst nearby measuring 1.8 x 1.3 x 1.2 cm. No sonographically demonstrable calculus or ureterectasis.  Bladder:  Appears normal for degree of bladder distention.  IMPRESSION: Cysts in each kidney with a dominant cyst arising from the upper pole left kidney, a finding present previously. No obstructing foci identified in either kidney. The renal echogenicity is within normal limits bilaterally. Renal cortical thickness is toward the lower limits of normal but is felt to be within normal limits.   Electronically Signed   By: Lowella Grip III M.D.   On: 10/24/2014 11:47   US Thoracentesis Asp Pleural Space W/img Guide  10/24/2014   CLINICAL DATA:  CHF and right  pleural effusion. Status post left thoracentesis on 10/22/2014.  EXAM: ULTRASOUND GUIDED RIGHT THORACENTESIS  COMPARISON:  None.  PROCEDURE: An ultrasound guided thoracentesis was  thoroughly discussed with the patient and questions answered. The benefits, risks, alternatives and complications were also discussed. The patient understands and wishes to proceed with the procedure. Written consent was obtained.  Ultrasound was performed to localize and mark an adequate pocket of fluid in the right posterior chest. The area was then prepped and draped in the normal sterile fashion. 1% Lidocaine was used for local anesthesia. Under ultrasound guidance a 6 French Safe-T-Centesis catheter was introduced. Thoracentesis was performed. The catheter was removed and a dressing applied.  COMPLICATIONS: None  FINDINGS: A total of approximately 750 mL of clear, yellowish fluid was removed.  IMPRESSION: Successful ultrasound guided right thoracentesis yielding 750 mL of pleural fluid.   Electronically Signed   By: Aletta Edouard M.D.   On: 10/24/2014 15:30     Management plans discussed with the patient, family and they are in agreement.  CODE STATUS:     Code Status Orders        Start     Ordered   10/21/14 1820  Full code   Continuous     10/21/14 1819    Advance Directive Documentation        Most Recent Value   Type of Advance Directive  Living will   Pre-existing out of facility DNR order (yellow form or pink MOST form)     "MOST" Form in Place?        TOTAL TIME TAKING CARE OF THIS PATIENT: 40 minutes.    Attie Nawabi M.D on 10/25/2014 at 9:49 AM  Between 7am to 6pm - Pager - [phone] After 6pm go to [email or web address] - password [email or web address] [place] [facility]  Office  [phone]  CC: Primary care physician; Kirk Ruths., MD

## 2014-10-25 NOTE — Clinical Social Work Note (Signed)
Patient to discharge today to [facility]. Discharge summary sent to [facility] and they can take today. Patient's daughter to transport patient today. Shela Leff MSW,LCSW [phone]

## 2014-10-25 NOTE — Care Management (Signed)
Patient to be discharged today to [facility].  No further needs identified.  RN CM signing off

## 2014-10-25 NOTE — Consult Note (Signed)
  GI Inpatient Follow-up Note  Patient Identification: Sheryl Hughes is a 81 y.o. female  Subjective: Feels much better today after 2 L of pleural fluid drained yesterday from both lungs. LFT finally coming down. No BM yet after starting miralax yesterday. ANA neg. ASMA minimally elevated. Hep A/B/C all negative. Belching much better. Coumadin resumed yesterday. Scheduled Inpatient Medications:  . furosemide  40 mg Intravenous Q12H  . metoprolol tartrate  25 mg Oral BID  . pantoprazole  40 mg Oral BID  . polyethylene glycol  17 g Oral Daily  . sodium chloride  3 mL Intravenous Q12H  . warfarin  2.5 mg Oral Once per day on Sun Tue Wed Thu Sat  . warfarin  5 mg Oral Once per day on Mon Fri  . Warfarin - Pharmacist Dosing Inpatient   Does not apply q1800    Continuous Inpatient Infusions:     PRN Inpatient Medications:  sodium chloride, acetaminophen **OR** acetaminophen, albuterol, ondansetron **OR** ondansetron (ZOFRAN) IV, sodium chloride, technetium TC 50M mebrofenin  Review of Systems: Constitutional: Weight is stable.  Eyes: No changes in vision. ENT: No oral lesions, sore throat.  GI: see HPI.  Heme/Lymph: No easy bruising.  CV: No chest pain.  GU: No hematuria.  Integumentary: No rashes.  Neuro: No headaches.  Psych: No depression/anxiety.  Endocrine: No heat/cold intolerance.  Allergic/Immunologic: No urticaria.  Resp: No cough, SOB.  Musculoskeletal: No joint swelling.    Physical Examination: BP 98/59 mmHg  Pulse 81  Temp(Src) 98 F (36.7 C) (Oral)  Resp 19  Ht 5\' 4"  (1.626 m)  Wt 48.1 kg (106 lb 0.7 oz)  BMI 18.19 kg/m2  SpO2 100% Gen: NAD, alert and oriented x 4 HEENT: PEERLA, EOMI, Neck: supple, no JVD or thyromegaly Chest: CTA bilaterally, no wheezes, crackles, or other adventitious sounds CV: RRR, no m/g/c/r Abd: soft, NT, ND, +BS in all four quadrants; no HSM, guarding, ridigity, or rebound tenderness Ext: no edema, well perfused with 2+  pulses, Skin: no rash or lesions noted Lymph: no LAD  Data: Lab Results  Component Value Date   WBC 8.3 10/23/2014   HGB 10.3* 10/23/2014   HCT 31.8* 10/23/2014   MCV 82.5 10/23/2014   PLT 200 10/23/2014    Recent Labs Lab 10/21/14 1116 10/22/14 0543 10/23/14 0435  HGB 11.2* 10.5* 10.3*   Lab Results  Component Value Date   NA 138 10/25/2014   K 3.5 10/25/2014   CL 101 10/25/2014   CO2 29 10/25/2014   BUN 35* 10/25/2014   CREATININE 1.18* 10/25/2014   Lab Results  Component Value Date   ALT 143* 10/25/2014   AST 112* 10/25/2014   ALKPHOS 110 10/25/2014   BILITOT 0.9 10/25/2014    Recent Labs Lab 10/22/14 0543  10/25/14 0423  APTT 35  --   --   INR 2.85  < > 1.97  < > = values in this interval not displayed. Assessment/Plan: Sheryl Hughes is a 81 y.o. female with GERD sxs and LFT elevation. Overall better. No BM yet. Possible discharge to rehab today.  Recommendations: Discharge on protonix bid for GERD sxs. Daily miralax. If pt can have BM today before discharge, please send stool for H.pylori Ag. Make sure pt keeps appt with Sheryl Hughes, [place] next Fri. Will repeat LFT then. Will sign off. Thanks. Please call with questions or concerns.  Sheryl Hughes, Lupita Dawn, MD

## 2014-10-25 NOTE — Progress Notes (Signed)
ANTICOAGULATION CONSULT NOTE - FOLLOW UP  Consult  Pharmacy Consult for warfarin Indication: atrial fibrillation  Allergies  Allergen Reactions  . Biaxin [Clarithromycin] Other (See Comments)    Numb lips  . Flagyl [Metronidazole] Other (See Comments)    Numb lips  . Penicillins Rash  . Tetracyclines & Related Rash    Patient Measurements: Height: 5\' 4"  (162.6 cm) Weight: 106 lb 0.7 oz (48.1 kg) IBW/kg (Calculated) : 54.7   Vital Signs: Temp: 98 F (36.7 C) (08/18 0451) Temp Source: Oral (08/18 0451) BP: 98/59 mmHg (08/18 0451) Pulse Rate: 81 (08/18 0451)  Labs:  Recent Labs  10/23/14 0435 10/23/14 1145 10/24/14 0449 10/25/14 0423  HGB 10.3*  --   --   --   HCT 31.8*  --   --   --   PLT 200  --   --   --   LABPROT 25.3* 21.9* 20.8* 22.6*  INR 2.28 1.89 1.77 1.97  CREATININE 2.56*  --  1.71* 1.18*    Estimated Creatinine Clearance: 28.9 mL/min (by C-G formula based on Cr of 1.18).   Medical History: Past Medical History  Diagnosis Date  . Atrial fibrillation   . CHF (congestive heart failure)   . Shingles   . CAD (coronary artery disease)   . Pulmonary hypertension   . GERD (gastroesophageal reflux disease)   . CKD (chronic kidney disease)     Medications:  Prescriptions prior to admission  Medication Sig Dispense Refill Last Dose  . acetaminophen (TYLENOL) 325 MG tablet Take 325 mg by mouth every 6 (six) hours as needed for mild pain, moderate pain or fever.    Past Month at Unknown time  . aspirin EC 81 MG tablet Take 81 mg by mouth daily.   10/16/2014  . atorvastatin (LIPITOR) 20 MG tablet Take 20 mg by mouth at bedtime.    10/20/2014 at Unknown time  . Calcium Carb-Cholecalciferol (CALCIUM 600 + D PO) Take 1 tablet by mouth daily.     . Cholecalciferol 2000 UNITS TABS Take 1 tablet by mouth daily.   10/21/2014 at Unknown time  . docusate sodium (COLACE) 100 MG capsule Take 100 mg by mouth 2 (two) times daily.   10/21/2014 at Unknown time  .  dronedarone (MULTAQ) 400 MG tablet Take 400 mg by mouth 2 (two) times daily with a meal.   10/21/2014 at Unknown time  . furosemide (LASIX) 40 MG tablet Take 40 mg by mouth 2 (two) times daily.    10/21/2014 at Unknown time  . metoprolol succinate (TOPROL-XL) 25 MG 24 hr tablet Take 25 mg by mouth 2 (two) times daily.   10/21/2014 at 1000  . omeprazole (PRILOSEC) 20 MG capsule Take 20 mg by mouth daily.   10/20/2014 at Unknown time  . potassium chloride (MICRO-K) 10 MEQ CR capsule Take 10 mEq by mouth 2 (two) times daily.    10/21/2014 at Unknown time  . vitamin B-12 (CYANOCOBALAMIN) 1000 MCG tablet Take 1,000 mcg by mouth daily.   10/21/2014 at Unknown time  . warfarin (COUMADIN) 5 MG tablet Take 2.5-5 mg by mouth See admin instructions. Take 5mg  tablet orally once a day Monday and Friday. Take 1/2 tablet (2.5mg ) orally on Sunday, Tuesday, Wednesday, Thursday, and Saturday.   10/16/2014  . polyethylene glycol (MIRALAX / GLYCOLAX) packet Take 17 g by mouth daily. 30 each 0   . polyethylene glycol powder (MIRALAX) powder Take 17 g by mouth daily. 255 g 0  Assessment: Pharmacy consulted to dose warfarin for afib in this 81 year old female. Warfarin held on admission for thoracentesis. Procedure performed today, per MD will resume warfarin this evening.   Regimen PTA:  Warfarin 2.5mg  on Sun, Tues, Wed, Thurs, Sat Warfarin 5mg  on Monday and Friday  8/14 - INR: 3.0, warfarin held 8/15 - INR: 2.85  Goal of Therapy:  INR 2-3   Plan:  Will give coumadin 2.5mg  today and resume home regimen starting tomorrow. Will follow daily INR.  Pharmacy to follow per consult  [phone] INR 2.28 - continue current regimen. Will check INR again tomorrow AM. Repeat INR resulted @ 1.89. Will give a one time dose of warfarin 4 mg PO x 1 and will recheck with am labs.    0817 INR 1.77 will repeat 4 mg dose x 1 today and recheck INR in AM.   0818 INR 1.97 will resume home dose 2.5 mg today and recheck INR in AM.    Ovid Curd A. Jordan Hawks, PharmD Clinical Pharmacist  10/25/2014,6:26 AM

## 2014-10-25 NOTE — Progress Notes (Signed)
Pt to be discharged to [facility] today. Iv and tele removed. Pt had bm after miralax, stoolsofteners and enema. Report called to julia at the facility. Pt to be transported by her family

## 2014-10-25 NOTE — Clinical Social Work Placement (Signed)
   CLINICAL SOCIAL WORK PLACEMENT  NOTE  Date:  10/25/2014  Patient Details  Name: Sheryl Hughes MRN: [id number] Date of Birth: 30-Mar-1931  Clinical Social Work is seeking post-discharge placement for this patient at the [place] level of care (*CSW will initial, date and re-position this form in  chart as items are completed):  Yes   Patient/family provided with [place] Work Department's list of facilities offering this level of care within the geographic area requested by the patient (or if unable, by the patient's family).  Yes   Patient/family informed of their freedom to choose among providers that offer the needed level of care, that participate in Medicare, Medicaid or managed care program needed by the patient, have an available bed and are willing to accept the patient.  Yes   Patient/family informed of [facility]'s ownership interest in [facility] and [facility], as well as of the fact that they are under no obligation to receive care at these facilities.  PASRR submitted to EDS on 10/24/14     PASRR number received on 10/24/14     Existing PASRR number confirmed on       FL2 transmitted to all facilities in geographic area requested by pt/family on 10/24/14     FL2 transmitted to all facilities within larger geographic area on       Patient informed that his/her managed care company has contracts with or will negotiate with certain facilities, including the following:        Yes   Patient/family informed of bed offers received.  Patient chooses bed at  [facility])     Physician recommends and patient chooses bed at  [facility])    Patient to be transferred to  [facility]) on 10/25/14.  Patient to be transferred to facility by  (family car)     Patient family notified on   of transfer.  Name of family member notified:   (daughter)     PHYSICIAN Please sign FL2     Additional Comment:     _______________________________________________ Shela Leff, LCSW 10/25/2014, 11:59 AM

## 2014-10-25 NOTE — Progress Notes (Signed)
[facility]  ROUNDING NOTE   Subjective:  Pt seen at bedside, doing better. Cr down to 1.1.    Objective:  Vital signs in last 24 hours:  Temp:  [97.8 F (36.6 C)-98.2 F (36.8 C)] 97.8 F (36.6 C) (08/18 0800) Pulse Rate:  [62-96] 62 (08/18 0800) Resp:  [18-20] 18 (08/18 0800) BP: (89-98)/(56-64) 89/56 mmHg (08/18 0800) SpO2:  [93 %-100 %] 93 % (08/18 0800) Weight:  [48.1 kg (106 lb 0.7 oz)] 48.1 kg (106 lb 0.7 oz) (08/18 0451)  Weight change: -0.163 kg (-5.7 oz) Filed Weights   10/23/14 0408 10/24/14 0336 10/25/14 0451  Weight: 48.943 kg (107 lb 14.4 oz) 48.263 kg (106 lb 6.4 oz) 48.1 kg (106 lb 0.7 oz)    Intake/Output: I/O last 3 completed shifts: In: 480 [P.O.:480] Out: 3000 [Urine:3000]   Intake/Output this shift:  Total I/O In: 240 [P.O.:240] Out: 750 [Urine:750]  Physical Exam: General: NAD, sitting up in chair  Head: Normocephalic, atraumatic. Moist oral mucosal membranes  Eyes: Anicteric  Neck: Supple, trachea midline  Lungs:  CTAB, normal effort  Heart: S1S2 no rubs  Abdomen:  Soft, nontender, BS present   Extremities: 2+ peripheral edema.  Neurologic: Nonfocal, moving all four extremities  Skin: No lesions       Basic Metabolic Panel:  Recent Labs Lab 10/21/14 1842 10/22/14 0543 10/23/14 0435 10/23/14 1711 10/24/14 0449 10/25/14 0423  NA 132* 134* 133*  --  133* 138  K 5.8* 4.5 3.3* 3.4* 4.1 3.5  CL 99* 101 99*  --  98* 101  CO2 18* 21* 22  --  25 29  GLUCOSE 111* 117* 97  --  88 96  BUN 62* 69* 65*  --  52* 35*  CREATININE 3.19* 3.30* 2.56*  --  1.71* 1.18*  CALCIUM 9.1 8.7* 8.3*  --  8.0* 8.2*  MG 2.4  --   --  2.2  --   --     Liver Function Tests:  Recent Labs Lab 10/21/14 1116 10/22/14 0543 10/23/14 0435 10/25/14 0423  AST 277* 370* 307* 112*  ALT 159* 226* 242* 143*  ALKPHOS 130* 132* 148* 110  BILITOT 1.9* 1.5* 1.2 0.9  PROT 7.2 5.5* 5.9* 5.2*  ALBUMIN 3.8 3.1* 3.1* 2.6*    Recent Labs Lab  10/22/14 1816  AMYLASE 53   No results for input(s): AMMONIA in the last 168 hours.  CBC:  Recent Labs Lab 10/21/14 1116 10/22/14 0543 10/23/14 0435  WBC 9.6 9.3 8.3  HGB 11.2* 10.5* 10.3*  HCT 34.8* 32.4* 31.8*  MCV 83.3 82.8 82.5  PLT 240 199 200    Cardiac Enzymes:  Recent Labs Lab 10/21/14 1116  TROPONINI 0.07*    BNP: Invalid input(s): POCBNP  CBG: No results for input(s): GLUCAP in the last 168 hours.  Microbiology: Results for orders placed or performed during the hospital encounter of 10/21/14  Body fluid culture     Status: None (Preliminary result)   Collection Time: 10/22/14  2:25 PM  Result Value Ref Range Status   Specimen Description FLUID  Final   Special Requests NONE  Final   Gram Stain RARE WBC SEEN NO ORGANISMS SEEN   Final   Culture NO GROWTH 3 DAYS  Final   Report Status PENDING  Incomplete    Coagulation Studies:  Recent Labs  10/23/14 0435 10/23/14 1145 10/24/14 0449 10/25/14 0423  LABPROT 25.3* 21.9* 20.8* 22.6*  INR 2.28 1.89 1.77 1.97    Urinalysis:  No results for input(s): COLORURINE, LABSPEC, [place], GLUCOSEU, HGBUR, BILIRUBINUR, KETONESUR, PROTEINUR, UROBILINOGEN, NITRITE, LEUKOCYTESUR in the last 72 hours.  Invalid input(s): APPERANCEUR    Imaging: Dg Chest 1 View  10/24/2014   CLINICAL DATA:  Status post right thoracentesis today.  EXAM: CHEST  1 VIEW  COMPARISON:  Single view of the chest 10/23/2014.  FINDINGS: Right pleural effusion seen on the comparison study is markedly decreased in size after thoracentesis. No pneumothorax is identified. Small left pleural effusion and basilar airspace disease appear unchanged. There is cardiomegaly without edema. AICD is in place.  IMPRESSION: Marked decrease in a right pleural effusion after thoracentesis. Negative for pneumothorax.  No change in a left pleural effusion and basilar airspace disease.   Electronically Signed   By: Inge Rise M.D.   On: 10/24/2014 12:50    US Renal  10/24/2014   CLINICAL DATA:  Acute renal failure  EXAM: RENAL / URINARY TRACT ULTRASOUND COMPLETE  COMPARISON:  October 23, 2009 CT abdomen and pelvis  FINDINGS: Right Kidney:  Length: 9.6 cm. Echogenicity and renal cortical thickness are within normal limits. No perinephric fluid or hydronephrosis visualized. There is a mildly lobulated cyst arising from the upper pole the right kidney measuring 0.6 x 1.3 x 1.6 cm. No sonographically demonstrable calculus or ureterectasis.  Left Kidney:  Length: 9.4 cm. Echogenicity and renal cortical thickness are within normal limits. No perinephric fluid or hydronephrosis visualized. There is a cyst arising from the upper pole of the left kidney measuring 5.3 x 4.8 x 5.8 cm. There is a cyst nearby measuring 1.8 x 1.3 x 1.2 cm. No sonographically demonstrable calculus or ureterectasis.  Bladder:  Appears normal for degree of bladder distention.  IMPRESSION: Cysts in each kidney with a dominant cyst arising from the upper pole left kidney, a finding present previously. No obstructing foci identified in either kidney. The renal echogenicity is within normal limits bilaterally. Renal cortical thickness is toward the lower limits of normal but is felt to be within normal limits.   Electronically Signed   By: Lowella Grip III M.D.   On: 10/24/2014 11:47   US Thoracentesis Asp Pleural Space W/img Guide  10/24/2014   CLINICAL DATA:  CHF and right pleural effusion. Status post left thoracentesis on 10/22/2014.  EXAM: ULTRASOUND GUIDED RIGHT THORACENTESIS  COMPARISON:  None.  PROCEDURE: An ultrasound guided thoracentesis was thoroughly discussed with the patient and questions answered. The benefits, risks, alternatives and complications were also discussed. The patient understands and wishes to proceed with the procedure. Written consent was obtained.  Ultrasound was performed to localize and mark an adequate pocket of fluid in the right posterior chest. The area was  then prepped and draped in the normal sterile fashion. 1% Lidocaine was used for local anesthesia. Under ultrasound guidance a 6 French Safe-T-Centesis catheter was introduced. Thoracentesis was performed. The catheter was removed and a dressing applied.  COMPLICATIONS: None  FINDINGS: A total of approximately 750 mL of clear, yellowish fluid was removed.  IMPRESSION: Successful ultrasound guided right thoracentesis yielding 750 mL of pleural fluid.   Electronically Signed   By: Aletta Edouard M.D.   On: 10/24/2014 15:30     Medications:     . furosemide  40 mg Oral BID  . metoprolol tartrate  25 mg Oral BID  . pantoprazole  40 mg Oral BID  . polyethylene glycol  17 g Oral Daily  . sodium chloride  3 mL Intravenous Q12H  . warfarin  2.5 mg Oral Once per day on Sun Tue Wed Thu Sat  . warfarin  5 mg Oral Once per day on Mon Fri  . Warfarin - Pharmacist Dosing Inpatient   Does not apply q1800   sodium chloride, acetaminophen **OR** acetaminophen, albuterol, ondansetron **OR** ondansetron (ZOFRAN) IV, sodium chloride, technetium TC 93M mebrofenin  Assessment/ Plan:  81 y.o. female female with a PMHx of chronic systolic heart faiulre EF 15%, atrial fibrillation, coronary artery disease, CKD stage III baseline Cr 1.03 with egfr of 56, pulmonary hypertension, coronary artery disease s/p CABG, history of pacemaker placement,who was admitted to [facility] on 10/21/2014 for evaluation of shortness of breath.  1. Acute renal failure/CKD stage III baseline Cr 1.03/proteinuria egfr of 56. Suspect acute renal failure now due to abnormal cardiorenal hemodynamics. Cr peaked at 3.3. -Renal function improved with diuresis, had cardiorenal syndrome which has improved, will continue to monitor renal function as outpt. Will schedule to see back in the office in the next week or two.  2. Hyponatremia: na up to 138 now, improved with lasix and improvement in heart failure.   3. Hypokalemia: K currently 3.5, will  monitor as outpt.  4. Acute systolic heart failure exacerbation: improved quite a bit, continue lasix 48m po bid at home.   LOS: 4 Yida Hyams 8/18/20162:04 PM

## 2014-10-26 LAB — BODY FLUID CULTURE: Culture: NO GROWTH

## 2014-10-30 LAB — BASIC METABOLIC PANEL
Anion gap: 10 (ref 5–15)
BUN: 39 mg/dL — AB (ref 6–20)
CALCIUM: 8.9 mg/dL (ref 8.9–10.3)
CO2: 27 mmol/L (ref 22–32)
CREATININE: 1.92 mg/dL — AB (ref 0.44–1.00)
Chloride: 95 mmol/L — ABNORMAL LOW (ref 101–111)
GFR calc Af Amer: 27 mL/min — ABNORMAL LOW (ref >60)
GFR calc non Af Amer: 24 mL/min — ABNORMAL LOW (ref >60)
GLUCOSE: 116 mg/dL — AB (ref 65–99)
Potassium: 4.2 mmol/L (ref 3.5–5.1)
SODIUM: 132 mmol/L — AB (ref 135–145)

## 2014-10-30 LAB — PROTIME-INR
INR: 2.03
PROTHROMBIN TIME: 23.1 s — AB (ref 11.4–15.0)

## 2014-11-06 LAB — BASIC METABOLIC PANEL
Anion gap: 14 (ref 5–15)
BUN: 31 mg/dL — AB (ref 6–20)
CHLORIDE: 83 mmol/L — AB (ref 101–111)
CO2: 36 mmol/L — AB (ref 22–32)
Calcium: 8.8 mg/dL — ABNORMAL LOW (ref 8.9–10.3)
Creatinine, Ser: 1.71 mg/dL — ABNORMAL HIGH (ref 0.44–1.00)
GFR calc Af Amer: 31 mL/min — ABNORMAL LOW (ref >60)
GFR calc non Af Amer: 27 mL/min — ABNORMAL LOW (ref >60)
GLUCOSE: 84 mg/dL (ref 65–99)
POTASSIUM: 2.5 mmol/L — AB (ref 3.5–5.1)
Sodium: 133 mmol/L — ABNORMAL LOW (ref 135–145)

## 2014-11-06 LAB — PROTIME-INR
INR: 3.37
Prothrombin Time: 34.1 seconds — ABNORMAL HIGH (ref 11.4–15.0)

## 2014-11-07 ENCOUNTER — Telehealth: Payer: Self-pay | Admitting: Family

## 2014-11-07 ENCOUNTER — Ambulatory Visit: Payer: Medicare Other | Admitting: Family

## 2014-11-07 NOTE — Telephone Encounter (Signed)
Patient was a no show for her initial appointment at the [facility] today (11/07/14). Appointment was confirmed yesterday with the facility. Letter mailed and will attempt to reschedule appointment.

## 2014-11-08 ENCOUNTER — Encounter
Admission: RE | Admit: 2014-11-08 | Discharge: 2014-11-08 | Disposition: A | Discharge status: Home / Self Care | Payer: Medicare Other | Source: Ambulatory Visit | Attending: Internal Medicine | Admitting: Internal Medicine

## 2014-11-09 ENCOUNTER — Other Ambulatory Visit
Admission: RE | Admit: 2014-11-09 | Discharge: 2014-11-09 | Disposition: A | Discharge status: Home / Self Care | Payer: Medicare Other | Source: Ambulatory Visit | Attending: Gerontology | Admitting: Gerontology

## 2014-11-09 DIAGNOSIS — E876 Hypokalemia: Secondary | ICD-10-CM | POA: Diagnosis present

## 2014-11-09 LAB — BASIC METABOLIC PANEL
Anion gap: 15 (ref 5–15)
BUN: 39 mg/dL — AB (ref 6–20)
CALCIUM: 9.2 mg/dL (ref 8.9–10.3)
CO2: 33 mmol/L — ABNORMAL HIGH (ref 22–32)
CREATININE: 2.16 mg/dL — AB (ref 0.44–1.00)
Chloride: 86 mmol/L — ABNORMAL LOW (ref 101–111)
GFR calc Af Amer: 24 mL/min — ABNORMAL LOW (ref >60)
GFR, EST NON AFRICAN AMERICAN: 20 mL/min — AB (ref >60)
GLUCOSE: 95 mg/dL (ref 65–99)
Potassium: 3 mmol/L — ABNORMAL LOW (ref 3.5–5.1)
SODIUM: 134 mmol/L — AB (ref 135–145)

## 2014-11-13 LAB — BASIC METABOLIC PANEL
Anion gap: 12 (ref 5–15)
BUN: 41 mg/dL — AB (ref 6–20)
CHLORIDE: 86 mmol/L — AB (ref 101–111)
CO2: 35 mmol/L — ABNORMAL HIGH (ref 22–32)
Calcium: 8.8 mg/dL — ABNORMAL LOW (ref 8.9–10.3)
Creatinine, Ser: 1.96 mg/dL — ABNORMAL HIGH (ref 0.44–1.00)
GFR calc Af Amer: 27 mL/min — ABNORMAL LOW (ref >60)
GFR calc non Af Amer: 23 mL/min — ABNORMAL LOW (ref >60)
Glucose, Bld: 113 mg/dL — ABNORMAL HIGH (ref 65–99)
POTASSIUM: 3.2 mmol/L — AB (ref 3.5–5.1)
SODIUM: 133 mmol/L — AB (ref 135–145)

## 2014-11-13 LAB — PROTIME-INR
INR: 3.18
Prothrombin Time: 32.6 seconds — ABNORMAL HIGH (ref 11.4–15.0)

## 2014-12-05 ENCOUNTER — Inpatient Hospital Stay: Payer: Medicare Other

## 2014-12-05 ENCOUNTER — Encounter: Payer: Self-pay | Admitting: Emergency Medicine

## 2014-12-05 ENCOUNTER — Inpatient Hospital Stay
Admission: EM | Admit: 2014-12-05 | Discharge: 2014-12-09 | DRG: 377 | Disposition: A | Discharge status: Home Health | Payer: Medicare Other | Attending: Internal Medicine | Admitting: Internal Medicine

## 2014-12-05 DIAGNOSIS — Z951 Presence of aortocoronary bypass graft: Secondary | ICD-10-CM

## 2014-12-05 DIAGNOSIS — I2510 Atherosclerotic heart disease of native coronary artery without angina pectoris: Secondary | ICD-10-CM | POA: Diagnosis present

## 2014-12-05 DIAGNOSIS — R0902 Hypoxemia: Secondary | ICD-10-CM

## 2014-12-05 DIAGNOSIS — R51 Headache: Secondary | ICD-10-CM | POA: Diagnosis present

## 2014-12-05 DIAGNOSIS — Z880 Allergy status to penicillin: Secondary | ICD-10-CM

## 2014-12-05 DIAGNOSIS — R519 Headache, unspecified: Secondary | ICD-10-CM

## 2014-12-05 DIAGNOSIS — Z7982 Long term (current) use of aspirin: Secondary | ICD-10-CM

## 2014-12-05 DIAGNOSIS — Z7901 Long term (current) use of anticoagulants: Secondary | ICD-10-CM

## 2014-12-05 DIAGNOSIS — R791 Abnormal coagulation profile: Secondary | ICD-10-CM | POA: Diagnosis present

## 2014-12-05 DIAGNOSIS — K641 Second degree hemorrhoids: Secondary | ICD-10-CM | POA: Diagnosis present

## 2014-12-05 DIAGNOSIS — N183 Chronic kidney disease, stage 3 (moderate): Secondary | ICD-10-CM | POA: Diagnosis present

## 2014-12-05 DIAGNOSIS — K625 Hemorrhage of anus and rectum: Secondary | ICD-10-CM

## 2014-12-05 DIAGNOSIS — E785 Hyperlipidemia, unspecified: Secondary | ICD-10-CM | POA: Diagnosis present

## 2014-12-05 DIAGNOSIS — R634 Abnormal weight loss: Secondary | ICD-10-CM | POA: Diagnosis present

## 2014-12-05 DIAGNOSIS — I959 Hypotension, unspecified: Secondary | ICD-10-CM | POA: Diagnosis present

## 2014-12-05 DIAGNOSIS — T45515A Adverse effect of anticoagulants, initial encounter: Secondary | ICD-10-CM | POA: Diagnosis present

## 2014-12-05 DIAGNOSIS — I480 Paroxysmal atrial fibrillation: Secondary | ICD-10-CM | POA: Diagnosis present

## 2014-12-05 DIAGNOSIS — D125 Benign neoplasm of sigmoid colon: Secondary | ICD-10-CM | POA: Diagnosis present

## 2014-12-05 DIAGNOSIS — D62 Acute posthemorrhagic anemia: Secondary | ICD-10-CM | POA: Diagnosis present

## 2014-12-05 DIAGNOSIS — J9601 Acute respiratory failure with hypoxia: Secondary | ICD-10-CM | POA: Diagnosis present

## 2014-12-05 DIAGNOSIS — Z881 Allergy status to other antibiotic agents status: Secondary | ICD-10-CM

## 2014-12-05 DIAGNOSIS — I5022 Chronic systolic (congestive) heart failure: Secondary | ICD-10-CM | POA: Diagnosis present

## 2014-12-05 DIAGNOSIS — R109 Unspecified abdominal pain: Secondary | ICD-10-CM

## 2014-12-05 DIAGNOSIS — I272 Other secondary pulmonary hypertension: Secondary | ICD-10-CM | POA: Diagnosis present

## 2014-12-05 DIAGNOSIS — K921 Melena: Secondary | ICD-10-CM | POA: Diagnosis not present

## 2014-12-05 DIAGNOSIS — K5730 Diverticulosis of large intestine without perforation or abscess without bleeding: Secondary | ICD-10-CM | POA: Diagnosis not present

## 2014-12-05 DIAGNOSIS — D6832 Hemorrhagic disorder due to extrinsic circulating anticoagulants: Secondary | ICD-10-CM

## 2014-12-05 DIAGNOSIS — Z79899 Other long term (current) drug therapy: Secondary | ICD-10-CM

## 2014-12-05 DIAGNOSIS — N184 Chronic kidney disease, stage 4 (severe): Secondary | ICD-10-CM | POA: Diagnosis present

## 2014-12-05 DIAGNOSIS — K219 Gastro-esophageal reflux disease without esophagitis: Secondary | ICD-10-CM | POA: Diagnosis present

## 2014-12-05 DIAGNOSIS — K5731 Diverticulosis of large intestine without perforation or abscess with bleeding: Secondary | ICD-10-CM | POA: Diagnosis present

## 2014-12-05 LAB — CBC
HEMATOCRIT: 29.8 % — AB (ref 35.0–47.0)
HEMOGLOBIN: 9.5 g/dL — AB (ref 12.0–16.0)
MCH: 25 pg — AB (ref 26.0–34.0)
MCHC: 31.8 g/dL — ABNORMAL LOW (ref 32.0–36.0)
MCV: 78.6 fL — AB (ref 80.0–100.0)
Platelets: 242 10*3/uL (ref 150–440)
RBC: 3.79 MIL/uL — AB (ref 3.80–5.20)
RDW: 17.8 % — ABNORMAL HIGH (ref 11.5–14.5)
WBC: 6.4 10*3/uL (ref 3.6–11.0)

## 2014-12-05 LAB — HEMOGLOBIN AND HEMATOCRIT, BLOOD
HCT: 31.5 % — ABNORMAL LOW (ref 35.0–47.0)
HCT: 26.4 % — ABNORMAL LOW (ref 35.0–47.0)
Hemoglobin: 10.4 g/dL — ABNORMAL LOW (ref 12.0–16.0)
Hemoglobin: 8.5 g/dL — ABNORMAL LOW (ref 12.0–16.0)

## 2014-12-05 LAB — COMPREHENSIVE METABOLIC PANEL
ALBUMIN: 3.8 g/dL (ref 3.5–5.0)
ALT: 15 U/L (ref 14–54)
ANION GAP: 12 (ref 5–15)
AST: 40 U/L (ref 15–41)
Alkaline Phosphatase: 87 U/L (ref 38–126)
BUN: 45 mg/dL — ABNORMAL HIGH (ref 6–20)
CHLORIDE: 94 mmol/L — AB (ref 101–111)
CO2: 29 mmol/L (ref 22–32)
Calcium: 9.3 mg/dL (ref 8.9–10.3)
Creatinine, Ser: 2.24 mg/dL — ABNORMAL HIGH (ref 0.44–1.00)
GFR calc non Af Amer: 20 mL/min — ABNORMAL LOW (ref >60)
GFR, EST AFRICAN AMERICAN: 23 mL/min — AB (ref >60)
GLUCOSE: 114 mg/dL — AB (ref 65–99)
POTASSIUM: 4 mmol/L (ref 3.5–5.1)
SODIUM: 135 mmol/L (ref 135–145)
Total Bilirubin: 1.1 mg/dL (ref 0.3–1.2)
Total Protein: 7.2 g/dL (ref 6.5–8.1)

## 2014-12-05 LAB — ABO/RH: ABO/RH(D): O POS

## 2014-12-05 LAB — PROTIME-INR
INR: 6.58 — AB
INR: 2.1
PROTHROMBIN TIME: 57.1 s — AB (ref 11.4–15.0)
Prothrombin Time: 23.7 seconds — ABNORMAL HIGH (ref 11.4–15.0)

## 2014-12-05 LAB — GLUCOSE, CAPILLARY: GLUCOSE-CAPILLARY: 86 mg/dL (ref 65–99)

## 2014-12-05 LAB — MRSA PCR SCREENING: MRSA by PCR: NEGATIVE

## 2014-12-05 MED ORDER — DRONEDARONE HCL 400 MG PO TABS
400.0000 mg | ORAL_TABLET | Freq: Two times a day (BID) | ORAL | Status: DC
  Administered 2014-12-05 – 2014-12-08 (×7): 400 mg via ORAL
  Filled 2014-12-05 (×11): qty 1

## 2014-12-05 MED ORDER — SODIUM CHLORIDE 0.9 % IV SOLN
INTRAVENOUS | Status: DC
  Administered 2014-12-05 – 2014-12-08 (×3): via INTRAVENOUS

## 2014-12-05 MED ORDER — ACETAMINOPHEN 325 MG PO TABS: 325.0000 mg | ORAL_TABLET | Freq: Four times a day (QID) | ORAL | Status: DC | PRN

## 2014-12-05 MED ORDER — ATORVASTATIN CALCIUM 20 MG PO TABS
20.0000 mg | ORAL_TABLET | Freq: Every day | ORAL | Status: DC
  Administered 2014-12-06 – 2014-12-08 (×3): 20 mg via ORAL
  Filled 2014-12-05 (×3): qty 1

## 2014-12-05 MED ORDER — PANTOPRAZOLE SODIUM 40 MG IV SOLR
40.0000 mg | Freq: Two times a day (BID) | INTRAVENOUS | Status: DC
  Administered 2014-12-05 – 2014-12-09 (×8): 40 mg via INTRAVENOUS
  Filled 2014-12-05 (×8): qty 40

## 2014-12-05 MED ORDER — PNEUMOCOCCAL VAC POLYVALENT 25 MCG/0.5ML IJ INJ
0.5000 mL | INJECTION | INTRAMUSCULAR | Status: DC
Start: 2014-12-06 — End: 2014-12-06

## 2014-12-05 MED ORDER — VITAMIN K1 10 MG/ML IJ SOLN
10.0000 mg | Freq: Once | INTRAVENOUS | Status: AC
  Administered 2014-12-05: 10 mg via INTRAVENOUS
  Filled 2014-12-05: qty 1

## 2014-12-05 MED ORDER — MUPIROCIN 2 % EX OINT
1.0000 "application " | TOPICAL_OINTMENT | Freq: Two times a day (BID) | CUTANEOUS | Status: DC
  Filled 2014-12-05: qty 22

## 2014-12-05 MED ORDER — SODIUM CHLORIDE 0.9 % IV BOLUS (SEPSIS): 1000.0000 mL | INTRAVENOUS | Status: DC | PRN

## 2014-12-05 MED ORDER — DOCUSATE SODIUM 100 MG PO CAPS
100.0000 mg | ORAL_CAPSULE | Freq: Two times a day (BID) | ORAL | Status: DC
  Administered 2014-12-06 – 2014-12-09 (×7): 100 mg via ORAL
  Filled 2014-12-05 (×7): qty 1

## 2014-12-05 MED ORDER — CHLORHEXIDINE GLUCONATE CLOTH 2 % EX PADS
6.0000 | MEDICATED_PAD | Freq: Every day | CUTANEOUS | Status: DC
  Administered 2014-12-07: 6 via TOPICAL

## 2014-12-05 MED ORDER — IOHEXOL 240 MG/ML SOLN
25.0000 mL | INTRAMUSCULAR | Status: AC
  Administered 2014-12-05 (×2): 25 mL via ORAL

## 2014-12-05 MED ORDER — VITAMIN B-12 1000 MCG PO TABS
1000.0000 ug | ORAL_TABLET | Freq: Every day | ORAL | Status: DC
  Administered 2014-12-06 – 2014-12-09 (×4): 1000 ug via ORAL
  Filled 2014-12-05 (×4): qty 1

## 2014-12-05 MED ORDER — CETYLPYRIDINIUM CHLORIDE 0.05 % MT LIQD
7.0000 mL | Freq: Two times a day (BID) | OROMUCOSAL | Status: DC
  Administered 2014-12-05 – 2014-12-09 (×7): 7 mL via OROMUCOSAL

## 2014-12-05 MED ORDER — SODIUM CHLORIDE 0.9 % IJ SOLN
3.0000 mL | Freq: Two times a day (BID) | INTRAMUSCULAR | Status: DC
  Administered 2014-12-05 – 2014-12-07 (×5): 3 mL via INTRAVENOUS

## 2014-12-05 MED ORDER — CHLORHEXIDINE GLUCONATE CLOTH 2 % EX PADS: 6.0000 | MEDICATED_PAD | Freq: Every day | CUTANEOUS | Status: DC

## 2014-12-05 MED ORDER — VITAMIN D3 25 MCG (1000 UNIT) PO TABS
2000.0000 [IU] | ORAL_TABLET | Freq: Every day | ORAL | Status: DC
  Administered 2014-12-06 – 2014-12-09 (×4): 2000 [IU] via ORAL
  Filled 2014-12-05 (×9): qty 2

## 2014-12-05 NOTE — ED Provider Notes (Signed)
[facility] Emergency Department Provider Note  Time seen: 1:39 PM  I have reviewed the triage vital signs and the nursing notes.   HISTORY  Chief Complaint Rectal Bleeding    HPI Sheryl Hughes is a 81 y.o. female with a past medical history of CHF, CAD, CK D, GERD, pulmonary hypertension, atrial fibrillation on Coumadin who presents the emergency department with rectal bleeding. According to the patient for the past 5 days she has felt intermittently weak and dizzy especially upon standing and with exertion. She has also noticed blood in her stool which has been worsening progressively over the past 5 days. She went to her primary care physician today and brought a stool sample which is guaiac positive and a primary care physicians and the patient to the emergency department for further workup. Patient denies any abdominal pain, besides her chronic lower abdominal pain (ongoing greater than 2 years, no acute change).Denies any nausea or vomiting. Denies fever.     Past Medical History  Diagnosis Date  . Atrial fibrillation   . CHF (congestive heart failure)   . Shingles   . CAD (coronary artery disease)   . Pulmonary hypertension   . GERD (gastroesophageal reflux disease)   . CKD (chronic kidney disease)     Patient Active Problem List   Diagnosis Date Noted  . Bilateral pleural effusion 10/21/2014  . Hyperkalemia 10/21/2014  . Renal failure (ARF), acute on chronic 10/21/2014  . Elevated troponin 10/17/2014  . CAD (coronary artery disease) 10/17/2014  . HLD (hyperlipidemia) 10/17/2014  . A-fib 10/17/2014  . GERD (gastroesophageal reflux disease) 10/17/2014  . Chronic kidney disease (CKD), stage IV (severe) 10/17/2014  . Chronic systolic congestive heart failure 10/17/2014  . Constipation 10/17/2014    Past Surgical History  Procedure Laterality Date  . Mastectomy    . Cardiac catheterization    . Back surgery    . Eye surgery    .  Thoracentesis Bilateral   . Pacemaker placement      defibrillator  . Mitral valve repair    . Coronary artery bypass graft      Current Outpatient Rx  Name  Route  Sig  Dispense  Refill  . acetaminophen (TYLENOL) 325 MG tablet   Oral   Take 325 mg by mouth every 6 (six) hours as needed for mild pain, moderate pain or fever.          Marland Kitchen aspirin EC 81 MG tablet   Oral   Take 81 mg by mouth daily.         Marland Kitchen atorvastatin (LIPITOR) 20 MG tablet   Oral   Take 20 mg by mouth at bedtime.          . Calcium Carb-Cholecalciferol (CALCIUM 600 + D PO)   Oral   Take 1 tablet by mouth daily.         . Cholecalciferol 2000 UNITS TABS   Oral   Take 1 tablet by mouth daily.         Marland Kitchen docusate sodium (COLACE) 100 MG capsule   Oral   Take 100 mg by mouth 2 (two) times daily.         Marland Kitchen dronedarone (MULTAQ) 400 MG tablet   Oral   Take 400 mg by mouth 2 (two) times daily with a meal.         . furosemide (LASIX) 40 MG tablet   Oral   Take 40 mg by mouth 2 (two)  times daily.          . metoprolol succinate (TOPROL-XL) 25 MG 24 hr tablet   Oral   Take 25 mg by mouth 2 (two) times daily.         Marland Kitchen omeprazole (PRILOSEC) 20 MG capsule   Oral   Take 20 mg by mouth daily.         . potassium chloride (MICRO-K) 10 MEQ CR capsule   Oral   Take 10 mEq by mouth 2 (two) times daily.          . vitamin B-12 (CYANOCOBALAMIN) 1000 MCG tablet   Oral   Take 1,000 mcg by mouth daily.         Marland Kitchen warfarin (COUMADIN) 5 MG tablet   Oral   Take 2.5-5 mg by mouth See admin instructions. Take 5mg  tablet orally once a day Monday and Friday. Take 1/2 tablet (2.5mg ) orally on Sunday, Tuesday, Wednesday, Thursday, and Saturday.         . polyethylene glycol powder (MIRALAX) powder   Oral   Take 17 g by mouth daily.   255 g   0     Allergies Biaxin; Flagyl; Penicillins; and Tetracyclines & related  Family History  Problem Relation Age of Onset  . Heart attack Mother    . Heart attack Father   . COPD Father   . Ulcers Father   . Atrial fibrillation Sister   . Lung cancer Sister   . Heart attack Brother   . Aortic stenosis Brother   . CAD Brother     Social History Social History  Substance Use Topics  . Smoking status: Never Smoker   . Smokeless tobacco: None  . Alcohol Use: No    Review of Systems Constitutional: Negative for fever. Cardiovascular: Negative for chest pain. Respiratory: Negative for shortness of breath. Gastrointestinal: Negative for abdominal pain, vomiting and diarrhea. Positive for rectal bleeding. Genitourinary: Negative for dysuria. Negative for hematuria. Neurological: Negative for headache 10-point ROS otherwise negative.  ____________________________________________   PHYSICAL EXAM:  VITAL SIGNS: ED Triage Vitals  Enc Vitals Group     BP 12/05/14 1219 95/73 mmHg     Pulse Rate 12/05/14 1158 108     Resp 12/05/14 1158 18     Temp 12/05/14 1158 97.5 F (36.4 C)     Temp Source 12/05/14 1158 Oral     SpO2 12/05/14 1158 100 %     Weight 12/05/14 1153 97 lb (43.999 kg)     Height 12/05/14 1153 5\' 4"  (1.626 m)     Head Cir --      Peak Flow --      Pain Score 12/05/14 1154 7     Pain Loc --      Pain Edu? --      Excl. in [place]? --     Constitutional: Alert and oriented. Well appearing and in no distress. Eyes: Normal exam ENT   Mouth/Throat: Mucous membranes are moist. Cardiovascular: Normal rate, regular rhythm. No murmurs, rubs, or gallops. Respiratory: Normal respiratory effort without tachypnea nor retractions. Breath sounds are clear and equal bilaterally. No wheezes/rales/rhonchi. Gastrointestinal: Soft and nontender. No distention. Rectal exam shows melena, strongly guaiac positive. Musculoskeletal: Nontender with normal range of motion in all extremities. Neurologic:  Normal speech and language. No gross focal neurologic deficits Psychiatric: Mood and affect are normal. Speech and behavior  are normal ____________________________________________    INITIAL IMPRESSION / ASSESSMENT AND PLAN / ED COURSE  Pertinent labs & imaging results that were available during my care of the patient were reviewed by me and considered in my medical decision making (see chart for details).  Patient with rectal bleeding 5 days, with intermittent weakness/dizziness. Hemoglobin 9.5. Patient's INR is supratherapeutic at 6.5. Guaiac positive dark stool on exam. We will admit the patient for further workup/evaluation and GI workup. We will hold the patient's Coumadin as the patient is supratherapeutic currently. No acute distress.  ____________________________________________   FINAL CLINICAL IMPRESSION(S) / ED DIAGNOSES  GI bleed   Harvest Dark, MD 12/05/14 1344

## 2014-12-05 NOTE — Progress Notes (Signed)
eLink Physician-Brief Progress Note Patient Name: Sheryl Hughes DOB: [birth year] MRN: [id number]   Date of Service  12/05/2014  HPI/Events of Note  81 yo female with PMH of AFIB on Warfarin. Admitted with Rectal bleeding. INR = 6.58. Warfarin held.  Received Vitamin K 10 mg IV X 1. GI consult >> correct PT/INR. May need colonoscopy. Last Hgb = 10.4.   eICU Interventions  Will check PT/INR at 10 PM.     Intervention Category Evaluation Type: New Patient Evaluation  Lysle Dingwall 12/05/2014, 6:29 PM

## 2014-12-05 NOTE — H&P (Signed)
[facility] NAME: Sheryl Hughes    MR#:  [id number]  DATE OF BIRTH:  04/25/1931  DATE OF ADMISSION:  12/05/2014  PRIMARY CARE PHYSICIAN: Kirk Ruths., MD   REQUESTING/REFERRING PHYSICIAN:   CHIEF COMPLAINT:   Chief Complaint  Patient presents with  . Rectal Bleeding    HISTORY OF PRESENT ILLNESS: Sheryl Hughes  is a 81 y.o. female with a known history of multiple medical problems including CK D, pulmonary hypertension, CHF, valvular problems,, paroxysmal A. fib on Coumadin therapy at home who presents to the hospital with complaints of rectal bleeding and abdominal pain. Apparently patient has been having rectal bleeding for approximately 4 days now intermittently. It is described as dark wine color, blood patient had at least 5 bowel movements with blood since last Saturday, 4 days ago. She feels lightheaded and dizzy whenever she stands up and she's been complaining of left-sided abdominal pain in the left lower quadrant for the past 2 weeks. Patient is described as sharp, intermittent pain accompanied by nausea and severe weakness and weight loss. Patient is approximate 6 pound weight loss in the past 2 weeks. She states that her by mouth intake is good and pain comes with no significant relation with food intake. On arrival to the hospital patient was noted to be anemic and a severely coagulopathic with INR level of 6.5. Hospitalist services were contacted for admission. During my evaluation, patient was noted to be hypotensive with systolic blood pressure of 86 and hypoxic. O2 sats as low as 80s on oxygen therapy. Chest x-ray is pending.   PAST MEDICAL HISTORY:   Past Medical History  Diagnosis Date  . Atrial fibrillation   . CHF (congestive heart failure)   . Shingles   . CAD (coronary artery disease)   . Pulmonary hypertension   . GERD (gastroesophageal reflux disease)   . CKD (chronic kidney disease)     PAST  SURGICAL HISTORY:  Past Surgical History  Procedure Laterality Date  . Mastectomy    . Cardiac catheterization    . Back surgery    . Eye surgery    . Thoracentesis Bilateral   . Pacemaker placement      defibrillator  . Mitral valve repair    . Coronary artery bypass graft      SOCIAL HISTORY:  Social History  Substance Use Topics  . Smoking status: Never Smoker   . Smokeless tobacco: Not on file  . Alcohol Use: No    FAMILY HISTORY:  Family History  Problem Relation Age of Onset  . Heart attack Mother   . Heart attack Father   . COPD Father   . Ulcers Father   . Atrial fibrillation Sister   . Lung cancer Sister   . Heart attack Brother   . Aortic stenosis Brother   . CAD Brother     DRUG ALLERGIES:  Allergies  Allergen Reactions  . Biaxin [Clarithromycin] Other (See Comments)    Reaction:  Numbness of lips   . Flagyl [Metronidazole] Other (See Comments)    Reaction:  Numbness of lips   . Penicillins Rash  . Tetracyclines & Related Rash    Review of Systems  Constitutional: Negative for fever, chills, weight loss and malaise/fatigue.  HENT: Negative for congestion.   Eyes: Negative for blurred vision and double vision.  Respiratory: Negative for cough, sputum production, shortness of breath and wheezing.   Cardiovascular: Negative for chest pain, palpitations,  orthopnea, leg swelling and PND.  Gastrointestinal: Positive for nausea, abdominal pain and blood in stool. Negative for vomiting, diarrhea, constipation and melena.  Genitourinary: Negative for dysuria, urgency, frequency and hematuria.  Musculoskeletal: Negative for falls.  Skin: Negative for rash.  Neurological: Positive for weakness. Negative for dizziness.  Psychiatric/Behavioral: Negative for depression and memory loss. The patient is not nervous/anxious.     MEDICATIONS AT HOME:  Prior to Admission medications   Medication Sig Start Date End Date Taking? Authorizing Brazen Domangue  acetaminophen  (TYLENOL) 325 MG tablet Take 325 mg by mouth every 6 (six) hours as needed for mild pain, moderate pain or fever.    Yes Historical Teagan Ozawa, MD  aspirin EC 81 MG tablet Take 81 mg by mouth daily.   Yes Historical Honest Safranek, MD  atorvastatin (LIPITOR) 20 MG tablet Take 20 mg by mouth at bedtime.    Yes Historical Rosezella Kronick, MD  Calcium Carb-Cholecalciferol (CALCIUM 600 + D PO) Take 1 tablet by mouth daily.   Yes Historical Brendy Ficek, MD  Cholecalciferol 2000 UNITS TABS Take 1 tablet by mouth daily.   Yes Historical Theo Reither, MD  docusate sodium (COLACE) 100 MG capsule Take 100 mg by mouth 2 (two) times daily.   Yes Historical Veron Senner, MD  dronedarone (MULTAQ) 400 MG tablet Take 400 mg by mouth 2 (two) times daily with a meal.   Yes Historical Ismelda Weatherman, MD  furosemide (LASIX) 40 MG tablet Take 40 mg by mouth 2 (two) times daily.    Yes Historical Vela Render, MD  metoprolol succinate (TOPROL-XL) 25 MG 24 hr tablet Take 25 mg by mouth 2 (two) times daily.   Yes Historical Blondell Laperle, MD  omeprazole (PRILOSEC) 20 MG capsule Take 20 mg by mouth daily.   Yes Historical Nayel Purdy, MD  potassium chloride (MICRO-K) 10 MEQ CR capsule Take 10 mEq by mouth 2 (two) times daily.    Yes Historical Natosha Bou, MD  vitamin B-12 (CYANOCOBALAMIN) 1000 MCG tablet Take 1,000 mcg by mouth daily.   Yes Historical Theus Espin, MD  warfarin (COUMADIN) 5 MG tablet Take 2.5-5 mg by mouth See admin instructions. Take 5mg  tablet orally once a day Monday and Friday. Take 1/2 tablet (2.5mg ) orally on Sunday, Tuesday, Wednesday, Thursday, and Saturday.   Yes Historical Lajoya Dombek, MD  polyethylene glycol powder (MIRALAX) powder Take 17 g by mouth daily. 10/16/14   Harvest Dark, MD      PHYSICAL EXAMINATION:   VITAL SIGNS: Blood pressure 89/66, pulse 101, temperature 97.5 F (36.4 C), temperature source Oral, resp. rate 20, height 5\' 4"  (1.626 m), weight 43.999 kg (97 lb), SpO2 92 %.  GENERAL:  81 y.o.-year-old patient lying in the bed  in moderate distress due to headache and abdominal pain.  EYES: Pupils equal, round, reactive to light and accommodation. No scleral icterus. Extraocular muscles intact.  HEENT: Head atraumatic, normocephalic. Oropharynx and nasopharynx clear.  NECK:  Supple, no jugular venous distention. No thyroid enlargement, no tenderness.  LUNGS: Normal breath sounds bilaterally, no wheezing, rales,rhonchi or crepitation. No use of accessory muscles of respiration.  CARDIOVASCULAR: S1, S2 normal. No murmurs, rubs, or gallops.  ABDOMEN: Soft, tender, mostly left lower quadrant with some pulmonary regarding but no rebound, nondistended. Bowel sounds present. No organomegaly or mass.  EXTREMITIES: No pedal edema, cyanosis, or clubbing.  NEUROLOGIC: Cranial nerves II through XII are intact. Muscle strength 5/5 in all extremities. Sensation intact. Gait not checked.  PSYCHIATRIC: The patient is alert and oriented x 3.  SKIN: No obvious rash, lesion, or  ulcer.   LABORATORY PANEL:   CBC  Recent Labs Lab 12/05/14 1203  WBC 6.4  HGB 9.5*  HCT 29.8*  PLT 242  MCV 78.6*  MCH 25.0*  MCHC 31.8*  RDW 17.8*   ------------------------------------------------------------------------------------------------------------------  Chemistries   Recent Labs Lab 12/05/14 1203  NA 135  K 4.0  CL 94*  CO2 29  GLUCOSE 114*  BUN 45*  CREATININE 2.24*  CALCIUM 9.3  AST 40  ALT 15  ALKPHOS 87  BILITOT 1.1   ------------------------------------------------------------------------------------------------------------------  Cardiac Enzymes No results for input(s): TROPONINI in the last 168 hours. ------------------------------------------------------------------------------------------------------------------  RADIOLOGY: No results found.  EKG: Orders placed or performed during the hospital encounter of 10/21/14  . ED EKG  . ED EKG  . EKG 12-Lead  . EKG 12-Lead    IMPRESSION AND PLAN:  Principal  Problem:   Rectal bleeding Active Problems:   Warfarin-induced coagulopathy   Hypotension   Headache   Acute respiratory failure with hypoxia 1. Rectal bleeding. Admit patient to medical floor, get gastroenterologist consultation for further recommendations. Possible colonoscopy or flexible sigmoidoscopy. Continue patient on PPIs 2. Acute posthemorrhagic anemia. At advance IV fluids to hydrate patient and follow patient's hemoglobin level every 8 hours 3. Left-sided abdominal pain, rule out ischemic colitis, get gastroenterology consultation. Get CT scan of abdomen and pelvis with oral contrast, may need patient initiate antibiotic therapy if CT scan is suggestive of inflammatory disease 4. CK D follow with IV fluid administration 5. Acute respiratory failure with hypoxia. Get chest x-ray to rule out CHF, pneumonia, etc. 6. Headache. Get CT scan of the head 7. Warfarin-induced coagulopathy. Hold Coumadin therapy. Follow patient's pro time daily. Give vitamin K 8. Hypotension. Continue IV fluids for now, follow for CHF   All the records are reviewed and case discussed with ED Carlen Fils, family. Management plans discussed with the patient, family and they are in agreement.  CODE STATUS: Advance Directive Documentation        Most Recent Value   Type of Advance Directive  Living will, Healthcare Power of Attorney   Pre-existing out of facility DNR order (yellow form or pink MOST form)     "MOST" Form in Place?         TOTAL  critical care TIME TAKING CARE OF THIS PATIENT: 75  minutes.    Theodoro Grist M.D on 12/05/2014 at 2:20 PM  Between 7am to 6pm - Pager - [phone] After 6pm go to [email or web address] - password [email or web address] [facility]  Office  [phone]  CC: Primary care physician; Kirk Ruths., MD

## 2014-12-05 NOTE — ED Notes (Signed)
Pt here with c/o bleeding with bowel movements since Sat. Pt states her stool has been "wine colored." Pt c/o LLQ pain, however, states this is ongoing. Daughter has positive hemoccult from Drs office.

## 2014-12-05 NOTE — ED Notes (Signed)
Pt starting second contrast bottle now.  Ben in [place] will take pt from ICU for scan.

## 2014-12-05 NOTE — Consult Note (Signed)
[facility]  [address] [place], [place] [address] Phone: [phone] Fax : [phone]  Consultation  Referring Provider:     No ref. provider found Primary Care Physician:  Ollen Bowl, MD Primary Gastroenterologist:  Dr. Candace Cruise          Reason for Consultation:     Rectal bleeding  Date of Admission:  12/05/2014 Date of Consultation:  12/05/2014         HPI:   Sheryl Hughes is a 81 y.o. female who comes in here with rectal bleeding. The patient was seen at her primary gastrologist office this morning and had been found to report a 6 pound weight loss over the last few weeks and rectal bleeding. The patient states she has had a few days of right red blood per rectum. The patient's hemoglobin on admission was 9.5 with her hemoglobin of 10.3 month ago. The patient's hemoglobin this afternoon was 10.4 without any blood transfusion. The patient also reports some left lower quadrant pain and she was set up for a CT scan that will happen later today. The patient has been following up with her cardiologist at [facility] clinic and with her pulmonologist with [facility] clinic. The patient's INR was also noted to be 6.58 on admission today. She is on this for an irregular heartbeat. There is no report of any black stools or nausea vomiting. She reports her last colonoscopy was over 10 years ago.  Past Medical History  Diagnosis Date  . Atrial fibrillation   . CHF (congestive heart failure)   . Shingles   . CAD (coronary artery disease)   . Pulmonary hypertension   . GERD (gastroesophageal reflux disease)   . CKD (chronic kidney disease)     Past Surgical History  Procedure Laterality Date  . Mastectomy    . Cardiac catheterization    . Back surgery    . Eye surgery    . Thoracentesis Bilateral   . Pacemaker placement      defibrillator  . Mitral valve repair    . Coronary artery bypass graft      Prior to Admission medications   Medication Sig Start Date End Date  Taking? Authorizing Provider  aspirin EC 81 MG tablet Take 81 mg by mouth daily.   Yes Historical Provider, MD  atorvastatin (LIPITOR) 20 MG tablet Take 20 mg by mouth at bedtime.    Yes Historical Provider, MD  Calcium Carb-Cholecalciferol (CALCIUM 600 + D PO) Take 1 tablet by mouth daily.   Yes Historical Provider, MD  Cholecalciferol (VITAMIN D) 2000 UNITS CAPS Take 2,000 Units by mouth daily.   Yes Historical Provider, MD  docusate sodium (COLACE) 100 MG capsule Take 100 mg by mouth 2 (two) times daily.   Yes Historical Provider, MD  dronedarone (MULTAQ) 400 MG tablet Take 400 mg by mouth 2 (two) times daily with a meal.   Yes Historical Provider, MD  furosemide (LASIX) 40 MG tablet Take 40 mg by mouth 2 (two) times daily.    Yes Historical Provider, MD  metolazone (ZAROXOLYN) 2.5 MG tablet Take 2.5 mg by mouth every Monday, Wednesday, and Friday.   Yes Historical Provider, MD  metoprolol succinate (TOPROL-XL) 25 MG 24 hr tablet Take 25 mg by mouth daily.    Yes Historical Provider, MD  pantoprazole (PROTONIX) 40 MG tablet Take 40 mg by mouth 2 (two) times daily.   Yes Historical Provider, MD  polyethylene glycol powder (MIRALAX) powder Take 17 g  by mouth daily. 10/16/14  Yes Harvest Dark, MD  potassium chloride (K-DUR) 10 MEQ tablet Take 30 mEq by mouth 2 (two) times daily.   Yes Historical Provider, MD  vitamin B-12 (CYANOCOBALAMIN) 1000 MCG tablet Take 1,000 mcg by mouth daily.   Yes Historical Provider, MD  warfarin (COUMADIN) 5 MG tablet Take 2.5-5 mg by mouth See admin instructions. Pt takes one tablet on Monday and Friday and one-half tablet on Sunday, Tuesday, Wednesday, Thursday, and Saturday.   Yes Historical Provider, MD    Family History  Problem Relation Age of Onset  . Heart attack Mother   . Heart attack Father   . COPD Father   . Ulcers Father   . Atrial fibrillation Sister   . Lung cancer Sister   . Heart attack Brother   . Aortic stenosis Brother   . CAD Brother        Social History  Substance Use Topics  . Smoking status: Never Smoker   . Smokeless tobacco: None  . Alcohol Use: No    Allergies as of 12/05/2014 - Review Complete 12/05/2014  Allergen Reaction Noted  . Biaxin [clarithromycin] Other (See Comments) 10/16/2014  . Flagyl [metronidazole] Other (See Comments) 10/16/2014  . Penicillins Rash and Other (See Comments) 10/16/2014  . Tetracyclines & related Rash 10/16/2014    Review of Systems:    All systems reviewed and negative except where noted in HPI.   Physical Exam:  Vital signs in last 24 hours: Temp:  [97.5 F (36.4 C)] 97.5 F (36.4 C) (09/28 1615) Pulse Rate:  [63-108] 93 (09/28 1615) Resp:  [18-20] 20 (09/28 1345) BP: (89-95)/(57-73) 89/66 mmHg (09/28 1330) SpO2:  [83 %-100 %] 95 % (09/28 1615) Weight:  [97 lb (43.999 kg)-97 lb 7.1 oz (44.2 kg)] 97 lb 7.1 oz (44.2 kg) (09/28 1615) Last BM Date: 12/05/14 General:   Pleasant, cooperative in NAD Head:  Normocephalic and atraumatic. Eyes:   No icterus.   Conjunctiva pink. PERRLA. Ears:  Normal auditory acuity. Neck:  Supple; no masses or thyroidomegaly Lungs: Respirations even and unlabored. Lungs clear to auscultation bilaterally.   No wheezes, crackles, or rhonchi.  Heart:  Regular rate and rhythm;  Without murmur, clicks, rubs or gallops Abdomen:  Soft, nondistended, nontender. Normal bowel sounds. No appreciable masses or hepatomegaly.  No rebound or guarding.  Rectal:  Not performed. Msk:  Symmetrical without gross deformities.  Strength  Extremities:  Without edema, cyanosis or clubbing. Neurologic:  Alert and oriented x3;  grossly normal neurologically. Skin:  Intact without significant lesions or rashes. Cervical Nodes:  No significant cervical adenopathy. Psych:  Alert and cooperative. Normal affect.  LAB RESULTS:  Recent Labs  12/05/14 1203 12/05/14 1526  WBC 6.4  --   HGB 9.5* 10.4*  HCT 29.8* 31.5*  PLT 242  --    BMET  Recent Labs   12/05/14 1203  NA 135  K 4.0  CL 94*  CO2 29  GLUCOSE 114*  BUN 45*  CREATININE 2.24*  CALCIUM 9.3   LFT  Recent Labs  12/05/14 1203  PROT 7.2  ALBUMIN 3.8  AST 40  ALT 15  ALKPHOS 87  BILITOT 1.1   PT/INR  Recent Labs  12/05/14 1203  LABPROT 57.1*  INR 6.58*    STUDIES: Dg Chest 2 View  12/05/2014   CLINICAL DATA:  Hypoxemia.  Rectal bleeding.  History of CHF.  EXAM: CHEST  2 VIEW  COMPARISON:  10/23/2014 chest radiograph.  FINDINGS: Right  subclavian dual lead ICD is stable in configuration with lead tips overlying the right atrium and right ventricle. Median sternotomy wires are aligned and intact. Surgical clips overlie the left mediastinum. Stable cardiomediastinal silhouette with mild cardiomegaly and minimally tortuous atherosclerotic thoracic aorta. No pneumothorax. Small left pleural effusion, decreased. Stable trace right pleural effusion. No pulmonary edema. Mild left basilar opacity, decreased, in keeping with passive atelectasis. No new focal lung opacity. Moderate degenerative changes in the thoracic spine.  IMPRESSION: 1. Stable mild cardiomegaly without pulmonary edema. 2. Small left pleural effusion, decreased. Stable trace right pleural effusion. 3. Decreased left basilar opacity, in keeping with mild passive atelectasis.   Electronically Signed   By: Ilona Sorrel M.D.   On: 12/05/2014 14:48      Impression / Plan:   Sheryl Hughes is a 81 y.o. y/o female with who was admitted with a lower GI bleed with bright red blood per rectum. The patient's hemoglobin on admission was 9.5 but is up to 10.4 this afternoon without any transfusion. Her baseline is around 10.3. The patient has some left lower quadrant pain and has been set up for a CT scan. If the CT scan does not show any sign of diverticula she may need to be set up for a colonoscopy. The patient's differential diagnosis includes diverticulosis with bleeding versus ischemic colitis. She will also need her  INR corrected prior to having any procedures done. The patient has been explained the plan and so has her daughter and they agree with it.   Thank you for involving me in the care of this patient.      LOS: 0 days   Ollen Bowl, MD  12/05/2014, 4:57 PM   Note: This dictation was prepared with Dragon dictation along with smaller phrase technology. Any transcriptional errors that result from this process are unintentional.

## 2014-12-06 LAB — CBC
HCT: 23.3 % — ABNORMAL LOW (ref 35.0–47.0)
Hemoglobin: 7.8 g/dL — ABNORMAL LOW (ref 12.0–16.0)
MCH: 26 pg (ref 26.0–34.0)
MCHC: 33.5 g/dL (ref 32.0–36.0)
MCV: 77.8 fL — AB (ref 80.0–100.0)
PLATELETS: 183 10*3/uL (ref 150–440)
RBC: 2.99 MIL/uL — AB (ref 3.80–5.20)
RDW: 17.6 % — AB (ref 11.5–14.5)
WBC: 4.6 10*3/uL (ref 3.6–11.0)

## 2014-12-06 LAB — BASIC METABOLIC PANEL
Anion gap: 9 (ref 5–15)
BUN: 36 mg/dL — ABNORMAL HIGH (ref 6–20)
CHLORIDE: 100 mmol/L — AB (ref 101–111)
CO2: 29 mmol/L (ref 22–32)
CREATININE: 1.7 mg/dL — AB (ref 0.44–1.00)
Calcium: 8.4 mg/dL — ABNORMAL LOW (ref 8.9–10.3)
GFR, EST AFRICAN AMERICAN: 32 mL/min — AB (ref >60)
GFR, EST NON AFRICAN AMERICAN: 27 mL/min — AB (ref >60)
Glucose, Bld: 83 mg/dL (ref 65–99)
Potassium: 3 mmol/L — ABNORMAL LOW (ref 3.5–5.1)
SODIUM: 138 mmol/L (ref 135–145)

## 2014-12-06 LAB — PROTIME-INR
INR: 1.73
PROTHROMBIN TIME: 20.4 s — AB (ref 11.4–15.0)

## 2014-12-06 LAB — HEMOGLOBIN: HEMOGLOBIN: 7.8 g/dL — AB (ref 12.0–16.0)

## 2014-12-06 MED ORDER — PNEUMOCOCCAL VAC POLYVALENT 25 MCG/0.5ML IJ INJ: 0.5000 mL | INJECTION | INTRAMUSCULAR | Status: DC

## 2014-12-06 MED ORDER — POTASSIUM CHLORIDE CRYS ER 20 MEQ PO TBCR
40.0000 meq | EXTENDED_RELEASE_TABLET | ORAL | Status: AC
  Administered 2014-12-06: 40 meq via ORAL
  Filled 2014-12-06: qty 2

## 2014-12-06 NOTE — Progress Notes (Signed)
Subjective: Patient with 2 episodes of rectal bleeding yesterday and no further bleeding since. The patient's hemoglobin did go down slightly. The patient denies any abdominal pain nausea vomiting fevers chills. The patient is in the ICU for episodes of hypotension.   Objective: Vital signs in last 24 hours: Filed Vitals:   12/06/14 0700 12/06/14 0715 12/06/14 0800 12/06/14 0900  BP: 86/52 83/52 98/63  91/62  Pulse: 83  94 100  Temp:  97.8 F (36.6 C)    TempSrc:  Oral    Resp: 11  15 20   Height:      Weight:      SpO2: 96%  95% 98%   Weight change:   Intake/Output Summary (Last 24 hours) at 12/06/14 1244 Last data filed at 12/06/14 1000  Gross per 24 hour  Intake 974.25 ml  Output   1750 ml  Net -775.75 ml     Exam: Regular rate and rhythm clear to auscultation soft, nontender, normal bowel sounds   Lab Results: @LABTEST2 @ Micro Results: Recent Results (from the past 240 hour(s))  MRSA PCR Screening     Status: None   Collection Time: 12/05/14  5:13 PM  Result Value Ref Range Status   MRSA by PCR NEGATIVE NEGATIVE Final    Comment:        The GeneXpert MRSA Assay (FDA approved for NASAL specimens only), is one component of a comprehensive MRSA colonization surveillance program. It is not intended to diagnose MRSA infection nor to guide or monitor treatment for MRSA infections.    Studies/Results: Ct Abdomen Pelvis Wo Contrast  12/05/2014   CLINICAL DATA:  Chronic left abdominal pain. Rectal bleeding. Intermittent weakness and dizziness. CHF, CAD, CKD and atrial fibrillation on Coumadin.  EXAM: CT ABDOMEN AND PELVIS WITHOUT CONTRAST  TECHNIQUE: Multidetector CT imaging of the abdomen and pelvis was performed following the standard protocol without IV contrast.  COMPARISON:  10/23/2009 CT abdomen/pelvis.  FINDINGS: Lower chest: Small layering bilateral pleural effusions, left greater than right. Small fat containing right Bochdalek hernia. Mild passive  atelectasis in the dependent lower lobes and lingula. Mild cardiomegaly. ICD leads are seen in the right atrium and right ventricle. Visualized lower median sternotomy wires are intact.  Hepatobiliary: Coarse calcification in the medial inferior right liver lobe, likely from prior granulomatous disease. Otherwise normal liver, with no liver mass. Normal gallbladder with no radiopaque cholelithiasis. No biliary ductal dilatation.  Pancreas: Normal, with no mass or duct dilation.  Spleen: Normal size. No mass.  Adrenals/Urinary Tract: Normal right adrenal. Stable benign 1.0 cm left adrenal adenoma. Simple 5.8 cm interpolar left renal cyst. Additional subcentimeter hypodense lesions in both kidneys are too small to characterize. Mild fullness of the renal pelves bilaterally without hydronephrosis. No renal stones. Normal bladder.  Stomach/Bowel: Grossly normal stomach. Normal caliber small bowel with no small bowel wall thickening. Normal appendix. There is mild diverticulosis throughout the colon, with no colonic wall thickening or pericolonic fat stranding. Oral contrast progresses to the distal rectum. Undigested pill fragments are noted in the rectum.  Vascular/Lymphatic: Atherosclerotic nonaneurysmal abdominal aorta. No pathologically enlarged lymph nodes in the abdomen or pelvis.  Reproductive: Grossly normal atrophic uterus.  No adnexal mass.  Other: No pneumoperitoneum, ascites or focal fluid collection.  Musculoskeletal: No aggressive appearing focal osseous lesions. Mild degenerative changes in the visualized thoracolumbar spine. Diffuse osteopenia.  IMPRESSION: 1. Mild colonic diverticulosis. No evidence of acute diverticulitis. No evidence of bowel obstruction or acute bowel inflammation. Normal appendix. 2. Small layering  bilateral pleural effusions, left greater than right, with associated mild passive bibasilar atelectasis. Mild cardiomegaly. 3. Stable small benign left adrenal adenoma.   Electronically  Signed   By: Ilona Sorrel M.D.   On: 12/05/2014 18:00   Dg Chest 2 View  12/05/2014   CLINICAL DATA:  Hypoxemia.  Rectal bleeding.  History of CHF.  EXAM: CHEST  2 VIEW  COMPARISON:  10/23/2014 chest radiograph.  FINDINGS: Right subclavian dual lead ICD is stable in configuration with lead tips overlying the right atrium and right ventricle. Median sternotomy wires are aligned and intact. Surgical clips overlie the left mediastinum. Stable cardiomediastinal silhouette with mild cardiomegaly and minimally tortuous atherosclerotic thoracic aorta. No pneumothorax. Small left pleural effusion, decreased. Stable trace right pleural effusion. No pulmonary edema. Mild left basilar opacity, decreased, in keeping with passive atelectasis. No new focal lung opacity. Moderate degenerative changes in the thoracic spine.  IMPRESSION: 1. Stable mild cardiomegaly without pulmonary edema. 2. Small left pleural effusion, decreased. Stable trace right pleural effusion. 3. Decreased left basilar opacity, in keeping with mild passive atelectasis.   Electronically Signed   By: Ilona Sorrel M.D.   On: 12/05/2014 14:48   Ct Head Wo Contrast  12/05/2014   CLINICAL DATA:  Headache and rectal bleeding  EXAM: CT HEAD WITHOUT CONTRAST  TECHNIQUE: Contiguous axial images were obtained from the base of the skull through the vertex without intravenous contrast.  COMPARISON:  None.  FINDINGS: No acute intracranial hemorrhage. No focal mass lesion. No CT evidence of acute infarction. No midline shift or mass effect. No hydrocephalus. Basilar cisterns are patent.  There is mild cortical atrophy and proportional ventricular dilatation. There is periventricular white matter hypodensities. Remote white matter infarction in the anterior RIGHT internal capsule.  Paranasal sinuses and  mastoid air cells are clear.  IMPRESSION: 1. No acute intracranial findings. 2. Chronic atrophy white matter microvascular disease.   Electronically Signed   By:  Suzy Bouchard M.D.   On: 12/05/2014 17:48   Medications: I have reviewed the patient's current medications. Scheduled Meds: . antiseptic oral rinse  7 mL Mouth Rinse BID  . atorvastatin  20 mg Oral QHS  . Chlorhexidine Gluconate Cloth  6 each Topical Q0600  . cholecalciferol  2,000 Units Oral Daily  . docusate sodium  100 mg Oral BID  . dronedarone  400 mg Oral BID WC  . pantoprazole (PROTONIX) IV  40 mg Intravenous Q12H  . [START ON 12/07/2014] pneumococcal 23 valent vaccine  0.5 mL Intramuscular Tomorrow-1000  . sodium chloride  3 mL Intravenous Q12H  . vitamin B-12  1,000 mcg Oral Daily   Continuous Infusions: . sodium chloride 50 mL/hr at 12/05/14 2218   PRN Meds:.acetaminophen, sodium chloride Assessment: Principal Problem:   Rectal bleeding Active Problems:   Warfarin-induced coagulopathy   Hypotension   Headache   Acute respiratory failure with hypoxia   Rectal bleed    Plan: This patient came in with lower GI bleed but reports that she has a history of H. pylori. The patient will be set up for an EGD and colonoscopy for tomorrow. The patient had a CT scan that showed her to have diverticulosis without any signs of diverticulitis. He has been explained the plan and agrees with it.   LOS: 1 day   Daren Wohl 12/06/2014, 12:44 PM

## 2014-12-06 NOTE — Progress Notes (Signed)
[facility]  Patient Status: active  [facility] is providing the following services: RN/PT  If patient discharges after hours, please call [phone].   Sheryl Hughes 12/06/2014, 9:38 AM

## 2014-12-06 NOTE — Progress Notes (Signed)
S/w Dr. Darvin Neighbours. Informed him of pt hgb and hct. No interventions at this time. Will continue to watch pt.

## 2014-12-06 NOTE — Progress Notes (Signed)
Informed Dr. Reece Levy about hgb results and BP. He wants to monitor and see what rounding MDs would like to do.

## 2014-12-06 NOTE — Progress Notes (Signed)
[facility] [place] NAME: Sheryl Hughes    MR#:  [id number]  DATE OF BIRTH:  [birth year]  SUBJECTIVE:  CHIEF COMPLAINT:   Chief Complaint  Patient presents with  . Rectal Bleeding   No bowel movement since coming into the hospital. No further bleeding. No nausea, vomiting, abdominal pain. Afebrile. REVIEW OF SYSTEMS:    Review of Systems  Constitutional: Positive for malaise/fatigue. Negative for fever and chills.  HENT: Negative for sore throat and tinnitus.   Eyes: Negative for blurred vision, double vision and pain.  Respiratory: Negative for cough, hemoptysis, shortness of breath and wheezing.   Cardiovascular: Negative for chest pain, palpitations, orthopnea and leg swelling.  Gastrointestinal: Negative for heartburn, nausea, vomiting, abdominal pain, diarrhea and constipation.  Genitourinary: Negative for dysuria and hematuria.  Musculoskeletal: Negative for back pain and joint pain.  Skin: Negative for rash.  Neurological: Positive for weakness. Negative for sensory change, speech change, focal weakness and headaches.  Endo/Heme/Allergies: Does not bruise/bleed easily.  Psychiatric/Behavioral: Negative for depression. The patient is not nervous/anxious.       DRUG ALLERGIES:   Allergies  Allergen Reactions  . Biaxin [Clarithromycin] Other (See Comments)    Reaction:  Numbness of lips   . Flagyl [Metronidazole] Other (See Comments)    Reaction:  Numbness of lips   . Penicillins Rash and Other (See Comments)    Pt is unable to answer additional questions about this medication because it happened so long ago.  . Tetracyclines & Related Rash    VITALS:  Blood pressure 91/56, pulse 102, temperature 97.8 F (36.6 C), temperature source Oral, resp. rate 19, height 5\' 4"  (1.626 m), weight 44.2 kg (97 lb 7.1 oz), SpO2 98 %.  PHYSICAL EXAMINATION:   Physical Exam  GENERAL:  81 y.o.-year-old patient lying in the bed  with no acute distress.  EYES: Pupils equal, round, reactive to light and accommodation. No scleral icterus. Extraocular muscles intact.  HEENT: Head atraumatic, normocephalic. Oropharynx and nasopharynx clear.  NECK:  Supple, no jugular venous distention. No thyroid enlargement, no tenderness.  LUNGS: Normal breath sounds bilaterally, no wheezing, rales, rhonchi. No use of accessory muscles of respiration.  CARDIOVASCULAR: S1, S2 normal. No murmurs, rubs, or gallops.  ABDOMEN: Soft, nontender, nondistended. Bowel sounds present. No organomegaly or mass.  EXTREMITIES: No cyanosis, clubbing or edema b/l.    NEUROLOGIC: Cranial nerves II through XII are intact. No focal Motor or sensory deficits b/l.   PSYCHIATRIC: The patient is alert and oriented x 3.  SKIN: No obvious rash, lesion, or ulcer.    LABORATORY PANEL:   CBC  Recent Labs Lab 12/06/14 0459  WBC 4.6  HGB 7.8*  HCT 23.3*  PLT 183   ------------------------------------------------------------------------------------------------------------------  Chemistries   Recent Labs Lab 12/05/14 1203 12/06/14 0459  NA 135 138  K 4.0 3.0*  CL 94* 100*  CO2 29 29  GLUCOSE 114* 83  BUN 45* 36*  CREATININE 2.24* 1.70*  CALCIUM 9.3 8.4*  AST 40  --   ALT 15  --   ALKPHOS 87  --   BILITOT 1.1  --    ------------------------------------------------------------------------------------------------------------------  Cardiac Enzymes No results for input(s): TROPONINI in the last 168 hours. ------------------------------------------------------------------------------------------------------------------  RADIOLOGY:  Ct Abdomen Pelvis Wo Contrast  12/05/2014   CLINICAL DATA:  Chronic left abdominal pain. Rectal bleeding. Intermittent weakness and dizziness. CHF, CAD, CKD and atrial fibrillation on Coumadin.  EXAM: CT ABDOMEN AND PELVIS WITHOUT  CONTRAST  TECHNIQUE: Multidetector CT imaging of the abdomen and pelvis was performed  following the standard protocol without IV contrast.  COMPARISON:  10/23/2009 CT abdomen/pelvis.  FINDINGS: Lower chest: Small layering bilateral pleural effusions, left greater than right. Small fat containing right Bochdalek hernia. Mild passive atelectasis in the dependent lower lobes and lingula. Mild cardiomegaly. ICD leads are seen in the right atrium and right ventricle. Visualized lower median sternotomy wires are intact.  Hepatobiliary: Coarse calcification in the medial inferior right liver lobe, likely from prior granulomatous disease. Otherwise normal liver, with no liver mass. Normal gallbladder with no radiopaque cholelithiasis. No biliary ductal dilatation.  Pancreas: Normal, with no mass or duct dilation.  Spleen: Normal size. No mass.  Adrenals/Urinary Tract: Normal right adrenal. Stable benign 1.0 cm left adrenal adenoma. Simple 5.8 cm interpolar left renal cyst. Additional subcentimeter hypodense lesions in both kidneys are too small to characterize. Mild fullness of the renal pelves bilaterally without hydronephrosis. No renal stones. Normal bladder.  Stomach/Bowel: Grossly normal stomach. Normal caliber small bowel with no small bowel wall thickening. Normal appendix. There is mild diverticulosis throughout the colon, with no colonic wall thickening or pericolonic fat stranding. Oral contrast progresses to the distal rectum. Undigested pill fragments are noted in the rectum.  Vascular/Lymphatic: Atherosclerotic nonaneurysmal abdominal aorta. No pathologically enlarged lymph nodes in the abdomen or pelvis.  Reproductive: Grossly normal atrophic uterus.  No adnexal mass.  Other: No pneumoperitoneum, ascites or focal fluid collection.  Musculoskeletal: No aggressive appearing focal osseous lesions. Mild degenerative changes in the visualized thoracolumbar spine. Diffuse osteopenia.  IMPRESSION: 1. Mild colonic diverticulosis. No evidence of acute diverticulitis. No evidence of bowel obstruction or  acute bowel inflammation. Normal appendix. 2. Small layering bilateral pleural effusions, left greater than right, with associated mild passive bibasilar atelectasis. Mild cardiomegaly. 3. Stable small benign left adrenal adenoma.   Electronically Signed   By: Ilona Sorrel M.D.   On: 12/05/2014 18:00   Dg Chest 2 View  12/05/2014   CLINICAL DATA:  Hypoxemia.  Rectal bleeding.  History of CHF.  EXAM: CHEST  2 VIEW  COMPARISON:  10/23/2014 chest radiograph.  FINDINGS: Right subclavian dual lead ICD is stable in configuration with lead tips overlying the right atrium and right ventricle. Median sternotomy wires are aligned and intact. Surgical clips overlie the left mediastinum. Stable cardiomediastinal silhouette with mild cardiomegaly and minimally tortuous atherosclerotic thoracic aorta. No pneumothorax. Small left pleural effusion, decreased. Stable trace right pleural effusion. No pulmonary edema. Mild left basilar opacity, decreased, in keeping with passive atelectasis. No Zayed Griffie focal lung opacity. Moderate degenerative changes in the thoracic spine.  IMPRESSION: 1. Stable mild cardiomegaly without pulmonary edema. 2. Small left pleural effusion, decreased. Stable trace right pleural effusion. 3. Decreased left basilar opacity, in keeping with mild passive atelectasis.   Electronically Signed   By: Ilona Sorrel M.D.   On: 12/05/2014 14:48   Ct Head Wo Contrast  12/05/2014   CLINICAL DATA:  Headache and rectal bleeding  EXAM: CT HEAD WITHOUT CONTRAST  TECHNIQUE: Contiguous axial images were obtained from the base of the skull through the vertex without intravenous contrast.  COMPARISON:  None.  FINDINGS: No acute intracranial hemorrhage. No focal mass lesion. No CT evidence of acute infarction. No midline shift or mass effect. No hydrocephalus. Basilar cisterns are patent.  There is mild cortical atrophy and proportional ventricular dilatation. There is periventricular white matter hypodensities. Remote white  matter infarction in the anterior RIGHT internal capsule.  Paranasal sinuses and  mastoid air cells are clear.  IMPRESSION: 1. No acute intracranial findings. 2. Chronic atrophy white matter microvascular disease.   Electronically Signed   By: Suzy Bouchard M.D.   On: 12/05/2014 17:48     ASSESSMENT AND PLAN:   * GI bleed with bright red blood per rectum Likely lower GI bleed from diverticulosis. But with her history of H. pylori need to consider upper GI bleed. Discussed with Dr. Allen Norris. Patient is to get EGD and colonoscopy tomorrow. Monitor hemoglobin. Transfuse as needed. Patient has had episodes of hypotension. We will transfuse a unit of blood she continues to be hypotensive.  * Acute blood loss anemia due to GI bleed.   * CKD 3 follow with IV fluid administration  * Headache. Get CT scan of the head  * Warfarin-induced coagulopathy INR is lower. She did get a dose of vitamin K. Coumadin held due to GI bleed.   All the records are reviewed and case discussed with Care Management/Social Workerr. Management plans discussed with the patient, family and they are in agreement.  CODE STATUS: Full code  DVT Prophylaxis: SCDs  TOTAL TIME TAKING CARE OF THIS PATIENT: 35 minutes.   POSSIBLE D/C IN 1-2 DAYS, DEPENDING ON CLINICAL CONDITION.   Hillary Bow R M.D on 12/06/2014 at 1:49 PM  Between 7am to 6pm - Pager - [phone]  After 6pm go to [email or web address] - password [email or web address] [facility]  Office  [phone]  CC: Primary care physician; Ollen Bowl, MD    Note: This dictation was prepared with Dragon dictation along with smaller phrase technology. Any transcriptional errors that result from this process are unintentional.

## 2014-12-07 ENCOUNTER — Encounter: Payer: Self-pay | Admitting: Anesthesiology

## 2014-12-07 LAB — GLUCOSE, CAPILLARY: GLUCOSE-CAPILLARY: 93 mg/dL (ref 65–99)

## 2014-12-07 LAB — HEMOGLOBIN: HEMOGLOBIN: 8.4 g/dL — AB (ref 12.0–16.0)

## 2014-12-07 MED ORDER — PEG 3350-KCL-NA BICARB-NACL 420 G PO SOLR
4000.0000 mL | Freq: Once | ORAL | Status: AC
  Administered 2014-12-07: 4000 mL via ORAL
  Filled 2014-12-07: qty 4000

## 2014-12-07 NOTE — Care Management Important Message (Signed)
Important Message  Patient Details  Name: Sheryl Hughes MRN: [id number] Date of Birth: 11/09/1931   Medicare Important Message Given:  Yes-second notification given    [name] 12/07/2014, 1:32 PM

## 2014-12-07 NOTE — Progress Notes (Signed)
MD/N this am for time of procedure, currently time unknown, Endo called to update about pt order, pt without orders for prep for procedure, consent obtained, nursing will cont to monitor

## 2014-12-07 NOTE — Progress Notes (Signed)
   12/07/14 1100  Clinical Encounter Type  Visited With Patient;Family  Visit Type Initial;Spiritual support  Consult/Referral To Chaplain  Spiritual Encounters  Spiritual Needs Prayer;Emotional  Stress Factors  Patient Stress Factors Loss of control  Chaplain rounded in the unit and offered a compassionate presence, support and prayer to the patient. Patient was disappointed because of change in procedure day/time. We talked together and processed her feelings and emotions and she asked for prayer. Chaplain Sonya A. Laws Ext. [phone]

## 2014-12-07 NOTE — Progress Notes (Signed)
[place] at [place] NAME: Sheryl Hughes    MR#:  [id number]  DATE OF BIRTH:  06-Apr-1931  SUBJECTIVE:  CHIEF COMPLAINT:   Chief Complaint  Patient presents with  . Rectal Bleeding   1 BM with blood yesterday.  No nausea, vomiting, abdominal pain. Afebrile. Unable to get endoscopy today. REVIEW OF SYSTEMS:    Review of Systems  Constitutional: Positive for malaise/fatigue. Negative for fever and chills.  HENT: Negative for sore throat and tinnitus.   Eyes: Negative for blurred vision, double vision and pain.  Respiratory: Negative for cough, hemoptysis, shortness of breath and wheezing.   Cardiovascular: Negative for chest pain, palpitations, orthopnea and leg swelling.  Gastrointestinal: Negative for heartburn, nausea, vomiting, abdominal pain, diarrhea and constipation.  Genitourinary: Negative for dysuria and hematuria.  Musculoskeletal: Negative for back pain and joint pain.  Skin: Negative for rash.  Neurological: Positive for weakness. Negative for sensory change, speech change, focal weakness and headaches.  Endo/Heme/Allergies: Does not bruise/bleed easily.  Psychiatric/Behavioral: Negative for depression. The patient is not nervous/anxious.       DRUG ALLERGIES:   Allergies  Allergen Reactions  . Biaxin [Clarithromycin] Other (See Comments)    Reaction:  Numbness of lips   . Flagyl [Metronidazole] Other (See Comments)    Reaction:  Numbness of lips   . Penicillins Rash and Other (See Comments)    Pt is unable to answer additional questions about this medication because it happened so long ago.  . Tetracyclines & Related Rash    VITALS:  Blood pressure 85/75, pulse 102, temperature 98.7 F (37.1 C), temperature source Oral, resp. rate 15, height 5\' 4"  (1.626 m), weight 44.2 kg (97 lb 7.1 oz), SpO2 98 %.  PHYSICAL EXAMINATION:   Physical Exam  GENERAL:  81 y.o.-year-old patient lying in the bed with no acute  distress.  EYES: Pupils equal, round, reactive to light and accommodation. No scleral icterus. Extraocular muscles intact.  HEENT: Head atraumatic, normocephalic. Oropharynx and nasopharynx clear.  NECK:  Supple, no jugular venous distention. No thyroid enlargement, no tenderness.  LUNGS: Normal breath sounds bilaterally, no wheezing, rales, rhonchi. No use of accessory muscles of respiration.  CARDIOVASCULAR: S1, S2 normal. No murmurs, rubs, or gallops.  ABDOMEN: Soft, nontender, nondistended. Bowel sounds present. No organomegaly or mass.  EXTREMITIES: No cyanosis, clubbing or edema b/l.    NEUROLOGIC: Cranial nerves II through XII are intact. No focal Motor or sensory deficits b/l.   PSYCHIATRIC: The patient is alert and oriented x 3.  SKIN: No obvious rash, lesion, or ulcer.    LABORATORY PANEL:   CBC  Recent Labs Lab 12/06/14 0459 12/06/14 1643  WBC 4.6  --   HGB 7.8* 7.8*  HCT 23.3*  --   PLT 183  --    ------------------------------------------------------------------------------------------------------------------  Chemistries   Recent Labs Lab 12/05/14 1203 12/06/14 0459  NA 135 138  K 4.0 3.0*  CL 94* 100*  CO2 29 29  GLUCOSE 114* 83  BUN 45* 36*  CREATININE 2.24* 1.70*  CALCIUM 9.3 8.4*  AST 40  --   ALT 15  --   ALKPHOS 87  --   BILITOT 1.1  --    ------------------------------------------------------------------------------------------------------------------  Cardiac Enzymes No results for input(s): TROPONINI in the last 168 hours. ------------------------------------------------------------------------------------------------------------------  RADIOLOGY:  Ct Abdomen Pelvis Wo Contrast  12/05/2014   CLINICAL DATA:  Chronic left abdominal pain. Rectal bleeding. Intermittent weakness and dizziness. CHF, CAD, CKD  and atrial fibrillation on Coumadin.  EXAM: CT ABDOMEN AND PELVIS WITHOUT CONTRAST  TECHNIQUE: Multidetector CT imaging of the abdomen and  pelvis was performed following the standard protocol without IV contrast.  COMPARISON:  10/23/2009 CT abdomen/pelvis.  FINDINGS: Lower chest: Small layering bilateral pleural effusions, left greater than right. Small fat containing right Bochdalek hernia. Mild passive atelectasis in the dependent lower lobes and lingula. Mild cardiomegaly. ICD leads are seen in the right atrium and right ventricle. Visualized lower median sternotomy wires are intact.  Hepatobiliary: Coarse calcification in the medial inferior right liver lobe, likely from prior granulomatous disease. Otherwise normal liver, with no liver mass. Normal gallbladder with no radiopaque cholelithiasis. No biliary ductal dilatation.  Pancreas: Normal, with no mass or duct dilation.  Spleen: Normal size. No mass.  Adrenals/Urinary Tract: Normal right adrenal. Stable benign 1.0 cm left adrenal adenoma. Simple 5.8 cm interpolar left renal cyst. Additional subcentimeter hypodense lesions in both kidneys are too small to characterize. Mild fullness of the renal pelves bilaterally without hydronephrosis. No renal stones. Normal bladder.  Stomach/Bowel: Grossly normal stomach. Normal caliber small bowel with no small bowel wall thickening. Normal appendix. There is mild diverticulosis throughout the colon, with no colonic wall thickening or pericolonic fat stranding. Oral contrast progresses to the distal rectum. Undigested pill fragments are noted in the rectum.  Vascular/Lymphatic: Atherosclerotic nonaneurysmal abdominal aorta. No pathologically enlarged lymph nodes in the abdomen or pelvis.  Reproductive: Grossly normal atrophic uterus.  No adnexal mass.  Other: No pneumoperitoneum, ascites or focal fluid collection.  Musculoskeletal: No aggressive appearing focal osseous lesions. Mild degenerative changes in the visualized thoracolumbar spine. Diffuse osteopenia.  IMPRESSION: 1. Mild colonic diverticulosis. No evidence of acute diverticulitis. No evidence of  bowel obstruction or acute bowel inflammation. Normal appendix. 2. Small layering bilateral pleural effusions, left greater than right, with associated mild passive bibasilar atelectasis. Mild cardiomegaly. 3. Stable small benign left adrenal adenoma.   Electronically Signed   By: Ilona Sorrel M.D.   On: 12/05/2014 18:00   Dg Chest 2 View  12/05/2014   CLINICAL DATA:  Hypoxemia.  Rectal bleeding.  History of CHF.  EXAM: CHEST  2 VIEW  COMPARISON:  10/23/2014 chest radiograph.  FINDINGS: Right subclavian dual lead ICD is stable in configuration with lead tips overlying the right atrium and right ventricle. Median sternotomy wires are aligned and intact. Surgical clips overlie the left mediastinum. Stable cardiomediastinal silhouette with mild cardiomegaly and minimally tortuous atherosclerotic thoracic aorta. No pneumothorax. Small left pleural effusion, decreased. Stable trace right pleural effusion. No pulmonary edema. Mild left basilar opacity, decreased, in keeping with passive atelectasis. No new focal lung opacity. Moderate degenerative changes in the thoracic spine.  IMPRESSION: 1. Stable mild cardiomegaly without pulmonary edema. 2. Small left pleural effusion, decreased. Stable trace right pleural effusion. 3. Decreased left basilar opacity, in keeping with mild passive atelectasis.   Electronically Signed   By: Ilona Sorrel M.D.   On: 12/05/2014 14:48   Ct Head Wo Contrast  12/05/2014   CLINICAL DATA:  Headache and rectal bleeding  EXAM: CT HEAD WITHOUT CONTRAST  TECHNIQUE: Contiguous axial images were obtained from the base of the skull through the vertex without intravenous contrast.  COMPARISON:  None.  FINDINGS: No acute intracranial hemorrhage. No focal mass lesion. No CT evidence of acute infarction. No midline shift or mass effect. No hydrocephalus. Basilar cisterns are patent.  There is mild cortical atrophy and proportional ventricular dilatation. There is periventricular white matter  hypodensities. Remote white matter infarction in the anterior RIGHT internal capsule.  Paranasal sinuses and  mastoid air cells are clear.  IMPRESSION: 1. No acute intracranial findings. 2. Chronic atrophy white matter microvascular disease.   Electronically Signed   By: Suzy Bouchard M.D.   On: 12/05/2014 17:48     ASSESSMENT AND PLAN:   * GI bleed with bright red blood per rectum Likely lower GI bleed from diverticulosis. But with her history of H. pylori need to consider upper GI bleed.  Patient is to get EGD and colonoscopy tomorrow. Monitor hemoglobin. Transfuse as needed. Patient has had episodes of hypotension.  Repeat Hb at transfuse if Hb lower  * Acute blood loss anemia due to GI bleed.   * CKD 3 follow with IV fluid administration  * Headache. Get CT scan of the head  * Warfarin-induced coagulopathy INR is lower. She did get a dose of vitamin K. Coumadin held due to GI bleed.   All the records are reviewed and case discussed with Care Management/Social Workerr. Management plans discussed with the patient, family and they are in agreement.  CODE STATUS: Full code  DVT Prophylaxis: SCDs  TOTAL TIME TAKING CARE OF THIS PATIENT: 35 minutes.   POSSIBLE D/C IN 1-2 DAYS, DEPENDING ON CLINICAL CONDITION.   Hillary Bow R M.D on 12/07/2014 at 1:18 PM  Between 7am to 6pm - Pager - [phone]  After 6pm go to [email or web address] - password [email or web address] [facility]  Office  [phone]  CC: Primary care physician; Ollen Bowl, MD    Note: This dictation was prepared with Dragon dictation along with smaller phrase technology. Any transcriptional errors that result from this process are unintentional.

## 2014-12-07 NOTE — Progress Notes (Signed)
MD note indicated EGD and Colonoscopy for today.  No orders noted.  Pt kept NPO since midnight in anticipation of procedure.

## 2014-12-07 NOTE — Care Management Note (Signed)
Case Management Note  Patient Details  Name: Sheryl Hughes MRN: [id number] Date of Birth: 1931/11/07  Subjective/Objective:   Admitted with acute bright red blood from rectum and acute GI bleed. For EGD and colonoscopy tomorrow.  Patient is active with [facility] for nursing and PT.                 Action/Plan: Resumption of care at discharge.  Expected Discharge Date:                  Expected Discharge Plan:  [facility]  In-House Referral:     Discharge planning Services  CM Consult  Post Acute Care Choice:  Home Health Choice offered to:     DME Arranged:    DME Agency:     HH Arranged:  RN, PT Impact Agency:  [facility]  Status of Service:  In process, will continue to follow  Medicare Important Message Given:    Date Medicare IM Given:    Medicare IM give by:    Date Additional Medicare IM Given:    Additional Medicare Important Message give by:     If discussed at [place] of Stay Meetings, dates discussed:    Additional Comments:  Jolly Mango, RN 12/07/2014, 12:59 PM

## 2014-12-08 ENCOUNTER — Encounter: Payer: Self-pay | Admitting: *Deleted

## 2014-12-08 ENCOUNTER — Encounter
Admission: EM | Disposition: A | Discharge status: Home Health | Payer: Self-pay | Source: Home / Self Care | Attending: Internal Medicine

## 2014-12-08 ENCOUNTER — Inpatient Hospital Stay: Payer: Medicare Other | Admitting: Anesthesiology

## 2014-12-08 DIAGNOSIS — K921 Melena: Secondary | ICD-10-CM

## 2014-12-08 DIAGNOSIS — D62 Acute posthemorrhagic anemia: Secondary | ICD-10-CM

## 2014-12-08 DIAGNOSIS — K5730 Diverticulosis of large intestine without perforation or abscess without bleeding: Secondary | ICD-10-CM | POA: Insufficient documentation

## 2014-12-08 DIAGNOSIS — D125 Benign neoplasm of sigmoid colon: Secondary | ICD-10-CM | POA: Insufficient documentation

## 2014-12-08 DIAGNOSIS — K641 Second degree hemorrhoids: Secondary | ICD-10-CM | POA: Insufficient documentation

## 2014-12-08 HISTORY — PX: ESOPHAGOGASTRODUODENOSCOPY (EGD) WITH PROPOFOL: SHX5813

## 2014-12-08 HISTORY — PX: COLONOSCOPY WITH PROPOFOL: SHX5780

## 2014-12-08 LAB — BASIC METABOLIC PANEL
ANION GAP: 7 (ref 5–15)
BUN: 13 mg/dL (ref 6–20)
CALCIUM: 8 mg/dL — AB (ref 8.9–10.3)
CHLORIDE: 107 mmol/L (ref 101–111)
CO2: 28 mmol/L (ref 22–32)
Creatinine, Ser: 1.06 mg/dL — ABNORMAL HIGH (ref 0.44–1.00)
GFR calc non Af Amer: 48 mL/min — ABNORMAL LOW (ref >60)
GFR, EST AFRICAN AMERICAN: 56 mL/min — AB (ref >60)
GLUCOSE: 93 mg/dL (ref 65–99)
POTASSIUM: 3.2 mmol/L — AB (ref 3.5–5.1)
Sodium: 142 mmol/L (ref 135–145)

## 2014-12-08 LAB — CBC
HCT: 28.6 % — ABNORMAL LOW (ref 35.0–47.0)
HEMOGLOBIN: 9.4 g/dL — AB (ref 12.0–16.0)
MCH: 26.4 pg (ref 26.0–34.0)
MCHC: 32.8 g/dL (ref 32.0–36.0)
MCV: 80.3 fL (ref 80.0–100.0)
Platelets: 175 10*3/uL (ref 150–440)
RBC: 3.56 MIL/uL — AB (ref 3.80–5.20)
RDW: 16.7 % — ABNORMAL HIGH (ref 11.5–14.5)
WBC: 5.8 10*3/uL (ref 3.6–11.0)

## 2014-12-08 LAB — HEMOGLOBIN: Hemoglobin: 7.5 g/dL — ABNORMAL LOW (ref 12.0–16.0)

## 2014-12-08 LAB — PREPARE RBC (CROSSMATCH)

## 2014-12-08 SURGERY — ESOPHAGOGASTRODUODENOSCOPY (EGD) WITH PROPOFOL
Anesthesia: General

## 2014-12-08 MED ORDER — ETOMIDATE 2 MG/ML IV SOLN
INTRAVENOUS | Status: DC | PRN
  Administered 2014-12-08: 10 mg via INTRAVENOUS
  Administered 2014-12-08 (×2): 4 mg via INTRAVENOUS

## 2014-12-08 MED ORDER — POTASSIUM CHLORIDE CRYS ER 20 MEQ PO TBCR
40.0000 meq | EXTENDED_RELEASE_TABLET | Freq: Once | ORAL | Status: AC
Start: 2014-12-08 — End: 2014-12-08
  Administered 2014-12-08: 40 meq via ORAL
  Filled 2014-12-08: qty 2

## 2014-12-08 MED ORDER — POTASSIUM CHLORIDE 10 MEQ/100ML IV SOLN
10.0000 meq | INTRAVENOUS | Status: DC
  Filled 2014-12-08 (×4): qty 100

## 2014-12-08 MED ORDER — SODIUM CHLORIDE 0.9 % IV SOLN: Freq: Once | INTRAVENOUS | Status: DC

## 2014-12-08 MED ORDER — PHENYLEPHRINE HCL 10 MG/ML IJ SOLN
INTRAMUSCULAR | Status: DC | PRN
  Administered 2014-12-08: 100 ug via INTRAVENOUS

## 2014-12-08 NOTE — Progress Notes (Signed)
Lab called: PRBCs ready to be transfused.

## 2014-12-08 NOTE — Anesthesia Procedure Notes (Signed)
Date/Time: 12/08/2014 8:35 AM Performed by: Nelda Marseille Pre-anesthesia Checklist: Patient identified, Emergency Drugs available, Suction available, Patient being monitored and Timeout performed Oxygen Delivery Method: Nasal cannula

## 2014-12-08 NOTE — Op Note (Signed)
[facility]astroenter[facility] Patient Name: Sheryl Hughes Procedure Date: 12/08/2014 8:08 AM MRN: [id number] Account #: [id number] Date of Birth: 1931/03/28 Admit Type: Inpatient Age: 81 Room: [facility] ENDO ROOM 4 Gender: Female Note Status: Finalized Procedure:         Colonoscopy Indications:       Hematochezia Providers:         Lucilla Lame, MD Referring MD:      Ocie Cornfield. Ouida Sills, MD (Referring MD) Medicines:         Propofol per Anesthesia Complications:     No immediate complications. Procedure:         Pre-Anesthesia Assessment:                    - Prior to the procedure, a History and Physical was                     performed, and patient medications and allergies were                     reviewed. The patient's tolerance of previous anesthesia                     was also reviewed. The risks and benefits of the procedure                     and the sedation options and risks were discussed with the                     patient. All questions were answered, and informed consent                     was obtained. Prior Anticoagulants: The patient has taken                     no previous anticoagulant or antiplatelet agents. ASA                     Grade Assessment: II - A patient with mild systemic                     disease. After reviewing the risks and benefits, the                     patient was deemed in satisfactory condition to undergo                     the procedure.                    After obtaining informed consent, the colonoscope was                     passed under direct vision. Throughout the procedure, the                     patient's blood pressure, pulse, and oxygen saturations                     were monitored continuously. The Colonoscope was                     introduced through the anus and advanced to the the cecum,  identified by appendiceal orifice and ileocecal valve. The                     colonoscopy was  performed without difficulty. The patient                     tolerated the procedure well. The quality of the bowel                     preparation was excellent. Findings:      The perianal and digital rectal examinations were normal.      Multiple small-mouthed diverticula were found in the sigmoid colon and       in the ascending colon.      A 4 mm polyp was found in the sigmoid colon. The polyp was sessile. The       polyp was removed with a cold snare. Resection and retrieval were       complete.      Non-bleeding internal hemorrhoids were found during retroflexion. The       hemorrhoids were Grade II (internal hemorrhoids that prolapse but reduce       spontaneously). Impression:        - Diverticulosis in the sigmoid colon and in the ascending                     colon.                    - One 4 mm polyp in the sigmoid colon. Resected and                     retrieved.                    - Non-bleeding internal hemorrhoids. Recommendation:    - Await pathology results. Procedure Code(s): --- Professional ---                    [phone], Colonoscopy, flexible; with removal of tumor(s),                     polyp(s), or other lesion(s) by snare technique Diagnosis Code(s): --- Professional ---                    K92.1, Melena                    D12.5, Benign neoplasm of sigmoid colon                    K64.1, Second degree hemorrhoids                    K57.30, Diverticulosis of large intestine without                     perforation or abscess without bleeding CPT copyright 2014 American Medical Association. All rights reserved. The codes documented in this report are preliminary and upon coder review may  be revised to meet current compliance requirements. Lucilla Lame, MD 12/08/2014 8:43:30 AM This report has been signed electronically. Number of Addenda: 0 Note Initiated On: 12/08/2014 8:08 AM Scope Withdrawal Time: 0 hours 6 minutes 27 seconds  Total Procedure Duration: 0  hours 14 minutes 45 seconds       [facility]

## 2014-12-08 NOTE — Anesthesia Preprocedure Evaluation (Signed)
Anesthesia Evaluation  Patient identified by MRN, date of birth, ID band Patient awake    Reviewed: Allergy & Precautions, H&P , NPO status , Patient's Chart, lab work & pertinent test results  History of Anesthesia Complications Negative for: history of anesthetic complications  Airway Mallampati: III  TM Distance: >3 FB Neck ROM: limited    Dental  (+) Poor Dentition, Chipped, Caps   Pulmonary neg pulmonary ROS, neg shortness of breath,    Pulmonary exam normal breath sounds clear to auscultation       Cardiovascular Exercise Tolerance: Poor + CAD and +CHF  + Valvular Problems/Murmurs  Rhythm:regular Rate:Normal + Diastolic murmurs    Neuro/Psych  Headaches, negative psych ROS   GI/Hepatic Neg liver ROS, GERD  Controlled,  Endo/Other    Renal/GU Renal diseasenegative Renal ROS  negative genitourinary   Musculoskeletal   Abdominal   Peds  Hematology negative hematology ROS (+)   Anesthesia Other Findings Past Medical History:   Atrial fibrillation (HCC)                                    CHF (congestive heart failure) (HCC)                         Shingles                                                     CAD (coronary artery disease)                                Pulmonary hypertension (HCC)                                 GERD (gastroesophageal reflux disease)                       CKD (chronic kidney disease)                                 Reproductive/Obstetrics negative OB ROS                             Anesthesia Physical Anesthesia Plan  ASA: IV  Anesthesia Plan: General   Post-op Pain Management:    Induction:   Airway Management Planned:   Additional Equipment:   Intra-op Plan:   Post-operative Plan:   Informed Consent: I have reviewed the patients History and Physical, chart, labs and discussed the procedure including the risks, benefits and alternatives  for the proposed anesthesia with the patient or authorized representative who has indicated his/her understanding and acceptance.   Dental Advisory Given  Plan Discussed with: Anesthesiologist, CRNA and Surgeon  Anesthesia Plan Comments:         Anesthesia Quick Evaluation

## 2014-12-08 NOTE — Progress Notes (Signed)
[place] at [place] NAME: Sheryl Hughes    MR#:  [id number]  DATE OF BIRTH:  [birth year]  SUBJECTIVE:  CHIEF COMPLAINT:   Chief Complaint  Patient presents with  . Rectal Bleeding   Admitted for BRBPR. Had come blood in stools overnight with the bowel prep. Returned from EGD/Colonoscopy. No abd pain.  REVIEW OF SYSTEMS:    Review of Systems  Constitutional: Positive for malaise/fatigue. Negative for fever and chills.  HENT: Negative for sore throat and tinnitus.   Eyes: Negative for blurred vision, double vision and pain.  Respiratory: Negative for cough, hemoptysis, shortness of breath and wheezing.   Cardiovascular: Negative for chest pain, palpitations, orthopnea and leg swelling.  Gastrointestinal: Negative for heartburn, nausea, vomiting, abdominal pain, diarrhea and constipation.  Genitourinary: Negative for dysuria and hematuria.  Musculoskeletal: Negative for back pain and joint pain.  Skin: Negative for rash.  Neurological: Positive for weakness. Negative for sensory change, speech change, focal weakness and headaches.  Endo/Heme/Allergies: Does not bruise/bleed easily.  Psychiatric/Behavioral: Negative for depression. The patient is not nervous/anxious.       DRUG ALLERGIES:   Allergies  Allergen Reactions  . Biaxin [Clarithromycin] Other (See Comments)    Reaction:  Numbness of lips   . Flagyl [Metronidazole] Other (See Comments)    Reaction:  Numbness of lips   . Penicillins Rash and Other (See Comments)    Pt is unable to answer additional questions about this medication because it happened so long ago.  . Tetracyclines & Related Rash    VITALS:  Blood pressure 94/75, pulse 103, temperature 98.7 F (37.1 C), temperature source Oral, resp. rate 14, height 5\' 4"  (1.626 m), weight 44.2 kg (97 lb 7.1 oz), SpO2 100 %.  PHYSICAL EXAMINATION:   Physical Exam  GENERAL:  81 y.o.-year-old patient lying in the  bed with no acute distress.  EYES: Pupils equal, round, reactive to light and accommodation. No scleral icterus. Extraocular muscles intact.  HEENT: Head atraumatic, normocephalic. Oropharynx and nasopharynx clear.  NECK:  Supple, no jugular venous distention. No thyroid enlargement, no tenderness.  LUNGS: Normal breath sounds bilaterally, no wheezing, rales, rhonchi. No use of accessory muscles of respiration.  CARDIOVASCULAR: S1, S2 normal. No murmurs, rubs, or gallops.  ABDOMEN: Soft, nontender, nondistended. Bowel sounds present. No organomegaly or mass.  EXTREMITIES: No cyanosis, clubbing or edema b/l.    NEUROLOGIC: Cranial nerves II through XII are intact. No focal Motor or sensory deficits b/l.   PSYCHIATRIC: The patient is alert and oriented x 3.  SKIN: No obvious rash, lesion, or ulcer.    LABORATORY PANEL:   CBC  Recent Labs Lab 12/06/14 0459  12/08/14 0503  WBC 4.6  --   --   HGB 7.8*  < > 7.5*  HCT 23.3*  --   --   PLT 183  --   --   < > = values in this interval not displayed. ------------------------------------------------------------------------------------------------------------------  Chemistries   Recent Labs Lab 12/05/14 1203  12/08/14 0503  NA 135  < > 142  K 4.0  < > 3.2*  CL 94*  < > 107  CO2 29  < > 28  GLUCOSE 114*  < > 93  BUN 45*  < > 13  CREATININE 2.24*  < > 1.06*  CALCIUM 9.3  < > 8.0*  AST 40  --   --   ALT 15  --   --   [facility]  87  --   --   BILITOT 1.1  --   --   < > = values in this interval not displayed. ------------------------------------------------------------------------------------------------------------------  Cardiac Enzymes No results for input(s): TROPONINI in the last 168 hours. ------------------------------------------------------------------------------------------------------------------  RADIOLOGY:  No results found.   ASSESSMENT AND PLAN:   * GI bleed with bright red blood per rectum Likely lower GI  bleed from diverticulosis. Colonoscopy/EDG showed diverticulosis, Polyp and int hemorrhoids. Bleeding seems to have stopped now. To get 1 Unit PRBC  * Acute blood loss anemia due to GI bleed.   * CKD 3 follow with IV fluid administration  * Headache. Get CT scan of the head  * Warfarin-induced coagulopathy INR is lower. She did get a dose of vitamin K. Coumadin held due to GI bleed.   All the records are reviewed and case discussed with Care Management/Social Workerr. Management plans discussed with the patient, family and they are in agreement.  CODE STATUS: Full code  DVT Prophylaxis: SCDs  TOTAL TIME TAKING CARE OF THIS PATIENT: 35 minutes.   POSSIBLE D/C IN 1-2 DAYS, DEPENDING ON CLINICAL CONDITION.   Hillary Bow R M.D on 12/08/2014 at 10:17 AM  Between 7am to 6pm - Pager - [phone]  After 6pm go to [email or web address] - password [email or web address] [facility]  Office  [phone]  CC: Primary care physician; Ollen Bowl, MD    Note: This dictation was prepared with Dragon dictation along with smaller phrase technology. Any transcriptional errors that result from this process are unintentional.

## 2014-12-08 NOTE — Op Note (Signed)
[facility] Gastroenterology Patient Name: Sheryl Hughes Procedure Date: 12/08/2014 8:10 AM MRN: [id number] Account #: [id number] Date of Birth: [birth year] Admit Type: Inpatient Age: 81 Room: [facility] ENDO ROOM 4 Gender: Female Note Status: Finalized Procedure:         Upper GI endoscopy Indications:       Acute post hemorrhagic anemia Providers:         Lucilla Lame, MD Referring MD:      Ocie Cornfield. Ouida Sills, MD (Referring MD) Medicines:         Propofol per Anesthesia Complications:     No immediate complications. Procedure:         Pre-Anesthesia Assessment:                    - Prior to the procedure, a History and Physical was                     performed, and patient medications and allergies were                     reviewed. The patient's tolerance of previous anesthesia                     was also reviewed. The risks and benefits of the procedure                     and the sedation options and risks were discussed with the                     patient. All questions were answered, and informed consent                     was obtained. Prior Anticoagulants: The patient has taken                     no previous anticoagulant or antiplatelet agents. ASA                     Grade Assessment: II - A patient with mild systemic                     disease. After reviewing the risks and benefits, the                     patient was deemed in satisfactory condition to undergo                     the procedure.                    After obtaining informed consent, the endoscope was passed                     under direct vision. Throughout the procedure, the                     patient's blood pressure, pulse, and oxygen saturations                     were monitored continuously. The Endoscope was introduced                     through the mouth, and advanced to the second part of  duodenum. The upper GI endoscopy was accomplished without            difficulty. The patient tolerated the procedure well. Findings:      The esophagus was normal.      The stomach was normal.      The examined duodenum was normal. Impression:        - Normal esophagus.                    - Normal stomach.                    - Normal examined duodenum.                    - No specimens collected. Recommendation:    - Perform a colonoscopy today. Procedure Code(s): --- Professional ---                    [id number], Esophagogastroduodenoscopy, flexible, transoral;                     diagnostic, including collection of specimen(s) by                     brushing or washing, when performed (separate procedure) Diagnosis Code(s): --- Professional ---                    D62, Acute posthemorrhagic anemia CPT copyright 2014 American Medical Association. All rights reserved. The codes documented in this report are preliminary and upon coder review may  be revised to meet current compliance requirements. Lucilla Lame, MD 12/08/2014 8:20:09 AM This report has been signed electronically. Number of Addenda: 0 Note Initiated On: 12/08/2014 8:10 AM      [facility]

## 2014-12-08 NOTE — Anesthesia Postprocedure Evaluation (Signed)
  Anesthesia Post-op Note  Patient: [name]  Procedure(s) Performed: Procedure(s) with comments: ESOPHAGOGASTRODUODENOSCOPY (EGD) WITH PROPOFOL (N/A) - Look into stomach with scope. COLONOSCOPY WITH PROPOFOL (N/A) - look into colon with scope  Anesthesia type:General  Patient location: PACU  Post pain: Pain level controlled  Post assessment: Post-op Vital signs reviewed, Patient's Cardiovascular Status Stable, Respiratory Function Stable, Patent Airway and No signs of Nausea or vomiting  Post vital signs: Reviewed and stable  Last Vitals:  Filed Vitals:   12/08/14 1053  BP: 105/62  Pulse: 97  Temp: 36.9 C  Resp:     Level of consciousness: awake, alert  and patient cooperative  Complications: No apparent anesthesia complications

## 2014-12-08 NOTE — Progress Notes (Signed)
MEDICATION RELATED CONSULT NOTE - INITIAL   Pharmacy Consult for electrolyte monitoring Indication: hypokalemia  Allergies  Allergen Reactions  . Biaxin [Clarithromycin] Other (See Comments)    Reaction:  Numbness of lips   . Flagyl [Metronidazole] Other (See Comments)    Reaction:  Numbness of lips   . Penicillins Rash and Other (See Comments)    Pt is unable to answer additional questions about this medication because it happened so long ago.  . Tetracyclines & Related Rash    Patient Measurements: Height: 5\' 4"  (162.6 cm) Weight: 97 lb 7.1 oz (44.2 kg) IBW/kg (Calculated) : 54.7 Adjusted Body Weight: 44.2 kg  Vital Signs: Temp: 98.3 F (36.8 C) (10/01 0500) Temp Source: Oral (10/01 0500) BP: 105/65 mmHg (10/01 0500) Pulse Rate: 95 (10/01 0500) Intake/Output from previous day: 09/30 0701 - 10/01 0700 In: 1950 [P.O.:750; I.V.:1200] Out: 700 [Urine:700] Intake/Output from this shift: Total I/O In: 700 [P.O.:150; I.V.:550] Out: 350 [Urine:350]  Labs:  Recent Labs  12/05/14 1203 12/05/14 1526 12/05/14 2305 12/06/14 0459 12/06/14 1643 12/07/14 1353 12/08/14 0503  WBC 6.4  --   --  4.6  --   --   --   HGB 9.5* 10.4* 8.5* 7.8* 7.8* 8.4* 7.5*  HCT 29.8* 31.5* 26.4* 23.3*  --   --   --   PLT 242  --   --  183  --   --   --   CREATININE 2.24*  --   --  1.70*  --   --  1.06*  ALBUMIN 3.8  --   --   --   --   --   --   PROT 7.2  --   --   --   --   --   --   AST 40  --   --   --   --   --   --   ALT 15  --   --   --   --   --   --   ALKPHOS 87  --   --   --   --   --   --   BILITOT 1.1  --   --   --   --   --   --    Estimated Creatinine Clearance: 29.5 mL/min (by C-G formula based on Cr of 1.06).   Microbiology: Recent Results (from the past 720 hour(s))  MRSA PCR Screening     Status: None   Collection Time: 12/05/14  5:13 PM  Result Value Ref Range Status   MRSA by PCR NEGATIVE NEGATIVE Final    Comment:        The GeneXpert MRSA Assay (FDA approved for  NASAL specimens only), is one component of a comprehensive MRSA colonization surveillance program. It is not intended to diagnose MRSA infection nor to guide or monitor treatment for MRSA infections.     Medical History: Past Medical History  Diagnosis Date  . Atrial fibrillation   . CHF (congestive heart failure)   . Shingles   . CAD (coronary artery disease)   . Pulmonary hypertension   . GERD (gastroesophageal reflux disease)   . CKD (chronic kidney disease)     Medications:  Infusions:  . sodium chloride 50 mL/hr at 12/08/14 0600    Assessment: 82 yof cc rectal bleeding with 1 bloody BM, no nausea/vomiting/abdominal pain. K 3.2, electrolyte consult placed. CKD III CrCl approx 29 mL/min.  Goal of Therapy:  K 3.5 to  5  Plan:  KCl 10 mEq IV x 4 will follow up electrolytes in AM.  Laural Benes, Pharm.D. Clinical Pharmacist 12/08/2014,6:18 AM

## 2014-12-08 NOTE — Transfer of Care (Signed)
Immediate Anesthesia Transfer of Care Note  Patient: Sheryl Hughes  Procedure(s) Performed: Procedure(s) with comments: ESOPHAGOGASTRODUODENOSCOPY (EGD) WITH PROPOFOL (N/A) - Look into stomach with scope. COLONOSCOPY WITH PROPOFOL (N/A) - look into colon with scope  Patient Location: PACU  Anesthesia Type:General  Level of Consciousness: sedated  Airway & Oxygen Therapy: Patient Spontanous Breathing and Patient connected to nasal cannula oxygen  Post-op Assessment: Report given to RN and Post -op Vital signs reviewed and stable  Post vital signs: Reviewed and stable  Last Vitals:  Filed Vitals:   12/08/14 0800  BP: 90/67  Pulse:   Temp:   Resp: 17    Complications: No apparent anesthesia complications

## 2014-12-08 NOTE — Plan of Care (Signed)
Problem: Phase I Progression Outcomes Goal: Pain controlled with appropriate interventions Outcome: Completed/Met Date Met:  12/08/14 Pt denies pain. Goal: OOB as tolerated unless otherwise ordered Outcome: Not Progressing Pt incontinent of stool, pt on bedrest for bowel prep. Goal: Hemodynamically stable Outcome: Not Progressing SPB 80-90s, Maps 60's. Transfuse per MD order for Hgb <8, NS @ 28m/hour.

## 2014-12-08 NOTE — Progress Notes (Signed)
Lab results:  MD paged d/t pt Hgb 7.5 and K 3.2 Spoke with Dr. Reece Levy via phone, orders received (transfuse 1 unit PRBCs and ICU electrolyte protocol). Care to be assumed.

## 2014-12-09 LAB — TYPE AND SCREEN
ABO/RH(D): O POS
Antibody Screen: NEGATIVE
UNIT DIVISION: 0
UNIT DIVISION: 0
Unit division: 0

## 2014-12-09 LAB — BASIC METABOLIC PANEL
Anion gap: 3 — ABNORMAL LOW (ref 5–15)
BUN: 12 mg/dL (ref 6–20)
CALCIUM: 8.2 mg/dL — AB (ref 8.9–10.3)
CO2: 26 mmol/L (ref 22–32)
CREATININE: 1.2 mg/dL — AB (ref 0.44–1.00)
Chloride: 108 mmol/L (ref 101–111)
GFR, EST AFRICAN AMERICAN: 48 mL/min — AB (ref >60)
GFR, EST NON AFRICAN AMERICAN: 42 mL/min — AB (ref >60)
GLUCOSE: 103 mg/dL — AB (ref 65–99)
Potassium: 4.2 mmol/L (ref 3.5–5.1)
Sodium: 137 mmol/L (ref 135–145)

## 2014-12-09 LAB — HEMOGLOBIN: Hemoglobin: 9.5 g/dL — ABNORMAL LOW (ref 12.0–16.0)

## 2014-12-09 LAB — MAGNESIUM: Magnesium: 1.8 mg/dL (ref 1.7–2.4)

## 2014-12-09 LAB — PHOSPHORUS: Phosphorus: 2.6 mg/dL (ref 2.5–4.6)

## 2014-12-09 MED ORDER — FERROUS SULFATE 325 (65 FE) MG PO TABS: 325.0000 mg | ORAL_TABLET | Freq: Two times a day (BID) | ORAL | Status: DC

## 2014-12-09 MED ORDER — DRONEDARONE HCL 400 MG PO TABS
400.0000 mg | ORAL_TABLET | Freq: Two times a day (BID) | ORAL | Status: DC
  Administered 2014-12-09: 400 mg via ORAL
  Filled 2014-12-09 (×3): qty 1

## 2014-12-09 NOTE — Progress Notes (Signed)
Patient alert and oriented, vitals stable, NS at 64ml/hr.  Afib/ST on cardiac monitor.  Patient to transfer to telemetry; report called to [place], [place].

## 2014-12-09 NOTE — Care Management Note (Signed)
Case Management Note  Patient Details  Name: Sheryl Hughes MRN: [id number] Date of Birth: 1931-09-29  Subjective/Objective:     Referral faxed and called to [facility] to resume home health RN and PT services.                Action/Plan:   Expected Discharge Date:                  Expected Discharge Plan:  [place]  In-House Referral:     Discharge planning Services  CM Consult  Post Acute Care Choice:  Home Health Choice offered to:     DME Arranged:    DME Agency:     HH Arranged:  RN, PT [place] Agency:  [place]  Status of Service:  In process, will continue to follow  Medicare Important Message Given:  Yes-second notification given Date Medicare IM Given:    Medicare IM give by:    Date Additional Medicare IM Given:    Additional Medicare Important Message give by:     If discussed at [place] of Stay Meetings, dates discussed:    Additional Comments:  Hermina Barnard A, RN 12/09/2014, 11:23 AM

## 2014-12-09 NOTE — Discharge Instructions (Signed)
°  DIET:  Cardiac diet  DISCHARGE CONDITION:  Stable  ACTIVITY:  Activity as tolerated  OXYGEN:  Home Oxygen: No.   Oxygen Delivery: room air  DISCHARGE LOCATION:  home   If you experience worsening of your admission symptoms, develop shortness of breath, life threatening emergency, suicidal or homicidal thoughts you must seek medical attention immediately by calling 911 or calling your MD immediately  if symptoms less severe.  You Must read complete instructions/literature along with all the possible adverse reactions/side effects for all the Medicines you take and that have been prescribed to you. Take any new Medicines after you have completely understood and accpet all the possible adverse reactions/side effects.   Please note  You were cared for by a hospitalist during your hospital stay. If you have any questions about your discharge medications or the care you received while you were in the hospital after you are discharged, you can call the unit and asked to speak with the hospitalist on call if the hospitalist that took care of you is not available. Once you are discharged, your primary care physician will handle any further medical issues. Please note that NO REFILLS for any discharge medications will be authorized once you are discharged, as it is imperative that you return to your primary care physician (or establish a relationship with a primary care physician if you do not have one) for your aftercare needs so that they can reassess your need for medications and monitor your lab values.   Stop taking coumadin till you see and discuss with your heart doctor

## 2014-12-09 NOTE — Progress Notes (Signed)
Patient transferred to room 231. Alert and oriented x 4. Denied any pain. No respiratory distress noted. Afib/Afluter on the monitor.  Skin assessment done with Cristopher Peru RN, no major skin issues noted except bruises on the upper extremities. Will continue to monitor.

## 2014-12-09 NOTE — Progress Notes (Signed)
MEDICATION RELATED CONSULT NOTE - Follow-Up   Pharmacy Consult for electrolyte monitoring Indication: hypokalemia  Allergies  Allergen Reactions  . Biaxin [Clarithromycin] Other (See Comments)    Reaction:  Numbness of lips   . Flagyl [Metronidazole] Other (See Comments)    Reaction:  Numbness of lips   . Penicillins Rash and Other (See Comments)    Pt is unable to answer additional questions about this medication because it happened so long ago.  . Tetracyclines & Related Rash    Patient Measurements: Height: 5\' 4"  (162.6 cm) Weight: 100 lb (45.36 kg) IBW/kg (Calculated) : 54.7 Adjusted Body Weight: 44.2 kg  Vital Signs: Temp: 98.1 F (36.7 C) (10/02 0128) Temp Source: Oral (10/02 0128) BP: 105/52 mmHg (10/02 0128) Pulse Rate: 105 (10/02 0128) Intake/Output from previous day: 10/01 0701 - 10/02 0700 In: 2433.5 [P.O.:1195; I.V.:862.5; Blood:376] Out: 800 [Urine:800] Intake/Output from this shift: Total I/O In: 120 [P.O.:120] Out: -   Labs:  Recent Labs  12/08/14 0503 12/08/14 1325 12/09/14 0512  WBC  --  5.8  --   HGB 7.5* 9.4* 9.5*  HCT  --  28.6*  --   PLT  --  175  --   CREATININE 1.06*  --  1.20*  MG  --   --  1.8  PHOS  --   --  2.6   Estimated Creatinine Clearance: 26.8 mL/min (by C-G formula based on Cr of 1.2).   Microbiology: Recent Results (from the past 720 hour(s))  MRSA PCR Screening     Status: None   Collection Time: 12/05/14  5:13 PM  Result Value Ref Range Status   MRSA by PCR NEGATIVE NEGATIVE Final    Comment:        The GeneXpert MRSA Assay (FDA approved for NASAL specimens only), is one component of a comprehensive MRSA colonization surveillance program. It is not intended to diagnose MRSA infection nor to guide or monitor treatment for MRSA infections.     Medical History: Past Medical History  Diagnosis Date  . Atrial fibrillation ([place])   . CHF (congestive heart failure) ([place])   . Shingles   . CAD (coronary artery  disease)   . Pulmonary hypertension ([place])   . GERD (gastroesophageal reflux disease)   . CKD (chronic kidney disease)     Medications:  Infusions:  . sodium chloride 50 mL/hr at 12/08/14 1900    Assessment: 82 yof cc rectal bleeding with 1 bloody BM, no nausea/vomiting/abdominal pain. Patient received potassium 40 mEq IV and potassium 40 mEq PO on 10/3.  Goal of Therapy:  K 3.5 to 5  Plan:  Electrolytes are WNL. Will obtain follow-up BMP with am labs. If electrolytes remain WNL, will obtain follow-up BMP Q48hrs thereafter.   Pharmacy will continue to monitor and adjust per consult.    Jakelyn Squyres L, Pharm.D. Clinical Pharmacist 12/09/2014,10:35 AM

## 2014-12-09 NOTE — Progress Notes (Signed)
Discharge instructions along with home medication list and follow up gone over with patient. Patient verbalized that she understood instruction. Made patient aware rx electronically submitted to pharmacy. Iv and telemetry removed. Patient to be discharged home on ra. No distress noted. Family to transport patient home [employer]

## 2014-12-09 NOTE — Progress Notes (Addendum)
Resume home health RN and PT services with [facility].

## 2014-12-11 ENCOUNTER — Encounter: Payer: Self-pay | Admitting: Gastroenterology

## 2014-12-11 LAB — SURGICAL PATHOLOGY

## 2014-12-11 NOTE — Addendum Note (Signed)
Addendum  created 12/11/14 1049 by Andria Frames, MD   Modules edited: Anesthesia Responsible Staff

## 2014-12-13 ENCOUNTER — Encounter: Payer: Self-pay | Admitting: Emergency Medicine

## 2014-12-13 ENCOUNTER — Emergency Department: Payer: Medicare Other

## 2014-12-13 ENCOUNTER — Emergency Department
Admission: EM | Admit: 2014-12-13 | Discharge: 2014-12-13 | Disposition: A | Discharge status: Home / Self Care | Payer: Medicare Other | Attending: Emergency Medicine | Admitting: Emergency Medicine

## 2014-12-13 DIAGNOSIS — Y9389 Activity, other specified: Secondary | ICD-10-CM | POA: Insufficient documentation

## 2014-12-13 DIAGNOSIS — S42212A Unspecified displaced fracture of surgical neck of left humerus, initial encounter for closed fracture: Secondary | ICD-10-CM

## 2014-12-13 DIAGNOSIS — W01198A Fall on same level from slipping, tripping and stumbling with subsequent striking against other object, initial encounter: Secondary | ICD-10-CM | POA: Insufficient documentation

## 2014-12-13 DIAGNOSIS — Z880 Allergy status to penicillin: Secondary | ICD-10-CM | POA: Diagnosis not present

## 2014-12-13 DIAGNOSIS — S42215A Unspecified nondisplaced fracture of surgical neck of left humerus, initial encounter for closed fracture: Secondary | ICD-10-CM | POA: Diagnosis not present

## 2014-12-13 DIAGNOSIS — Y9289 Other specified places as the place of occurrence of the external cause: Secondary | ICD-10-CM | POA: Insufficient documentation

## 2014-12-13 DIAGNOSIS — Y998 Other external cause status: Secondary | ICD-10-CM | POA: Insufficient documentation

## 2014-12-13 DIAGNOSIS — S4992XA Unspecified injury of left shoulder and upper arm, initial encounter: Secondary | ICD-10-CM | POA: Diagnosis present

## 2014-12-13 LAB — PROTIME-INR
INR: 1.14
PROTHROMBIN TIME: 14.8 s (ref 11.4–15.0)

## 2014-12-13 MED ORDER — OXYCODONE-ACETAMINOPHEN 5-325 MG PO TABS
1.0000 | ORAL_TABLET | Freq: Once | ORAL | Status: AC
  Administered 2014-12-13: 1 via ORAL
  Filled 2014-12-13: qty 1

## 2014-12-13 MED ORDER — ONDANSETRON HCL 4 MG PO TABS
4.0000 mg | ORAL_TABLET | Freq: Once | ORAL | Status: AC
  Administered 2014-12-13: 4 mg via ORAL
  Filled 2014-12-13: qty 1

## 2014-12-13 MED ORDER — OXYCODONE-ACETAMINOPHEN 5-325 MG PO TABS: 2.0000 | ORAL_TABLET | Freq: Four times a day (QID) | ORAL | Status: DC | PRN
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
## 2014-12-13 NOTE — Discharge Instructions (Signed)
Humerus Fracture Treated With Immobilization °The humerus is the large bone in your upper arm. You have a broken (fractured) humerus. These fractures are easily diagnosed with X-rays. °TREATMENT  °Simple fractures which will heal without disability are treated with simple immobilization. Immobilization means you will wear a cast, splint, or sling. You have a fracture which will do well with immobilization. The fracture will heal well simply by being held in a good position until it is stable enough to begin range of motion exercises. Do not take part in activities which would further injure your arm.  °HOME CARE INSTRUCTIONS  °· Put ice on the injured area. °¨ Put ice in a plastic bag. °¨ Place a towel between your skin and the bag. °¨ Leave the ice on for 15-20 minutes, 03-04 times a day. °· If you have a cast: °¨ Do not scratch the skin under the cast using sharp or pointed objects. °¨ Check the skin around the cast every day. You may put lotion on any red or sore areas. °¨ Keep your cast dry and clean. °· If you have a splint: °¨ Wear the splint as directed. °¨ Keep your splint dry and clean. °¨ You may loosen the elastic around the splint if your fingers become numb, tingle, or turn cold or blue. °· If you have a sling: °¨ Wear the sling as directed. °· Do not put pressure on any part of your cast or splint until it is fully hardened. °· Your cast or splint can be protected during bathing with a plastic bag. Do not lower the cast or splint into water. °· Only take over-the-counter or prescription medicines for pain, discomfort, or fever as directed by your caregiver. °· Do range of motion exercises as instructed by your caregiver. °· Follow up as directed by your caregiver. This is very important in order to avoid permanent injury or disability and chronic pain. °SEEK IMMEDIATE MEDICAL CARE IF:  °· Your skin or nails in the injured arm turn blue or gray. °· Your arm feels cold or numb. °· You develop severe pain  in the injured arm. °· You are having problems with the medicines you were given. °MAKE SURE YOU:  °· Understand these instructions. °· Will watch your condition. °· Will get help right away if you are not doing well or get worse. °  °This information is not intended to replace advice given to you by your health care provider. Make sure you discuss any questions you have with your health care provider. °  °Document Released: 06/01/2000 Document Revised: 03/16/2014 Document Reviewed: 07/18/2014 °Elsevier Interactive Patient Education ©2016 [employer] Inc. ° °
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
## 2014-12-13 NOTE — ED Notes (Signed)
AAOx3.  Skin warm and dry.  NAD 
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
## 2014-12-13 NOTE — ED Notes (Signed)
Patient transported to X-ray 

## 2014-12-15 NOTE — Discharge Summary (Signed)
[facility] NAME: Sheryl Hughes    MR#:  [id number]  DATE OF BIRTH:  08-27-1931  DATE OF ADMISSION:  12/05/2014 ADMITTING PHYSICIAN: Theodoro Grist, MD  DATE OF DISCHARGE: 12/09/2014  1:42 PM  PRIMARY CARE PHYSICIAN: Ollen Bowl, MD    ADMISSION DIAGNOSIS:  Hypoxemia [R09.02] Left sided abdominal pain [R10.9] Headache [R51]  DISCHARGE DIAGNOSIS:  Principal Problem:   Rectal bleeding Active Problems:   Warfarin-induced coagulopathy (HCC)   Hypotension   Headache   Acute respiratory failure with hypoxia (HCC)   Rectal bleed   Acute posthemorrhagic anemia   Blood in stool   Benign neoplasm of sigmoid colon   Second degree hemorrhoids   Diverticulosis of large intestine without diverticulitis   SECONDARY DIAGNOSIS:   Past Medical History  Diagnosis Date  . Atrial fibrillation ([place])   . CHF (congestive heart failure) ([place])   . Shingles   . CAD (coronary artery disease)   . Pulmonary hypertension ([place])   . GERD (gastroesophageal reflux disease)   . CKD (chronic kidney disease)      ADMITTING HISTORY  HISTORY OF PRESENT ILLNESS: Sheryl Hughes is a 81 y.o. female with a known history of multiple medical problems including CK D, pulmonary hypertension, CHF, valvular problems,, paroxysmal A. fib on Coumadin therapy at home who presents to the hospital with complaints of rectal bleeding and abdominal pain. Apparently patient has been having rectal bleeding for approximately 4 days now intermittently. It is described as dark wine color, blood patient had at least 5 bowel movements with blood since last Saturday, 4 days ago. She feels lightheaded and dizzy whenever she stands up and she's been complaining of left-sided abdominal pain in the left lower quadrant for the past 2 weeks. Patient is described as sharp, intermittent pain accompanied by nausea and severe weakness and weight loss. Patient is approximate 6 pound weight  loss in the past 2 weeks. She states that her by mouth intake is good and pain comes with no significant relation with food intake. On arrival to the hospital patient was noted to be anemic and a severely coagulopathic with INR level of 6.5. Hospitalist services were contacted for admission. During my evaluation, patient was noted to be hypotensive with systolic blood pressure of 86 and hypoxic. O2 sats as low as 80s on oxygen therapy. Chest x-ray is pending.    HOSPITAL COURSE:   * GI bleed with bright red blood per rectum Likely lower GI bleed from diverticulosis. Colonoscopy/EDG showed diverticulosis, Polyp and int hemorrhoids. Bleeding  stopped now. Sleep 1 Unit PRBC transfusion during the hospital stay.  * Acute blood loss anemia due to GI bleed.  * CKD 3 follow with IV fluid administration  * Headache. Get CT scan of the head  * Warfarin-induced coagulopathy INR is lower. She did get a dose of vitamin K. Coumadin held due to GI bleed. Can restart Coumadin if cleared by cardiology during outpatient follow-up.  No further bleeding. Hemoglobin stable. Stable for discharge home.      CONSULTS OBTAINED:  Treatment Team:  Lucilla Lame, MD  DRUG ALLERGIES:   Allergies  Allergen Reactions  . Biaxin [Clarithromycin] Other (See Comments)    Reaction:  Numbness of lips   . Flagyl [Metronidazole] Other (See Comments)    Reaction:  Numbness of lips   . Penicillins Rash and Other (See Comments)    Pt is unable to answer additional questions about this medication because  it happened so long ago.  . Tetracyclines & Related Rash    DISCHARGE MEDICATIONS:   Discharge Medication List as of 12/09/2014 12:24 PM    START taking these medications   Details  ferrous sulfate 325 (65 FE) MG tablet Take 1 tablet (325 mg total) by mouth 2 (two) times daily with a meal., Starting 12/09/2014, Until Discontinued, Normal      CONTINUE these medications which have NOT CHANGED   Details   aspirin EC 81 MG tablet Take 81 mg by mouth daily., Until Discontinued, Historical Med    atorvastatin (LIPITOR) 20 MG tablet Take 20 mg by mouth at bedtime. , Until Discontinued, Historical Med    Calcium Carb-Cholecalciferol (CALCIUM 600 + D PO) Take 1 tablet by mouth daily., Until Discontinued, Historical Med    Cholecalciferol (VITAMIN D) 2000 UNITS CAPS Take 2,000 Units by mouth daily., Until Discontinued, Historical Med    docusate sodium (COLACE) 100 MG capsule Take 100 mg by mouth 2 (two) times daily., Until Discontinued, Historical Med    dronedarone (MULTAQ) 400 MG tablet Take 400 mg by mouth 2 (two) times daily with a meal., Until Discontinued, Historical Med    furosemide (LASIX) 40 MG tablet Take 40 mg by mouth 2 (two) times daily. , Until Discontinued, Historical Med    metolazone (ZAROXOLYN) 2.5 MG tablet Take 2.5 mg by mouth every Monday, Wednesday, and Friday., Until Discontinued, Historical Med    metoprolol succinate (TOPROL-XL) 25 MG 24 hr tablet Take 25 mg by mouth daily. , Until Discontinued, Historical Med    pantoprazole (PROTONIX) 40 MG tablet Take 40 mg by mouth 2 (two) times daily., Until Discontinued, Historical Med    potassium chloride (K-DUR) 10 MEQ tablet Take 30 mEq by mouth 2 (two) times daily., Until Discontinued, Historical Med    vitamin B-12 (CYANOCOBALAMIN) 1000 MCG tablet Take 1,000 mcg by mouth daily., Until Discontinued, Historical Med    polyethylene glycol powder (MIRALAX) powder Take 17 g by mouth daily., Starting 10/16/2014, Until Discontinued, Print      STOP taking these medications     warfarin (COUMADIN) 5 MG tablet      acetaminophen (TYLENOL) 325 MG tablet      Cholecalciferol 2000 UNITS TABS          Today    VITAL SIGNS:  Blood pressure 101/65, pulse 114, temperature 98.1 F (36.7 C), temperature source Oral, resp. rate 18, height 5\' 4"  (1.626 m), weight 45.36 kg (100 lb), SpO2 93 %.  I/O:  No intake or output data  in the 24 hours ending 12/15/14 1241  PHYSICAL EXAMINATION:  Physical Exam  GENERAL:  81 y.o.-year-old patient lying in the bed with no acute distress.  LUNGS: Normal breath sounds bilaterally, no wheezing, rales,rhonchi or crepitation. No use of accessory muscles of respiration.  CARDIOVASCULAR: S1, S2 normal. No murmurs, rubs, or gallops.  ABDOMEN: Soft, non-tender, non-distended. Bowel sounds present. No organomegaly or mass.  NEUROLOGIC: Moves all 4 extremities. PSYCHIATRIC: The patient is alert and oriented x 3.  SKIN: No obvious rash, lesion, or ulcer.   DATA REVIEW:   CBC  Recent Labs Lab 12/08/14 1325 12/09/14 0512  WBC 5.8  --   HGB 9.4* 9.5*  HCT 28.6*  --   PLT 175  --     Chemistries   Recent Labs Lab 12/09/14 0512  NA 137  K 4.2  CL 108  CO2 26  GLUCOSE 103*  BUN 12  CREATININE 1.20*  CALCIUM  8.2*  MG 1.8    Cardiac Enzymes No results for input(s): TROPONINI in the last 168 hours.  Microbiology Results  Results for orders placed or performed during the hospital encounter of 12/05/14  MRSA PCR Screening     Status: None   Collection Time: 12/05/14  5:13 PM  Result Value Ref Range Status   MRSA by PCR NEGATIVE NEGATIVE Final    Comment:        The GeneXpert MRSA Assay (FDA approved for NASAL specimens only), is one component of a comprehensive MRSA colonization surveillance program. It is not intended to diagnose MRSA infection nor to guide or monitor treatment for MRSA infections.     RADIOLOGY:  No results found.    Follow up with PCP in 1 week.  Management plans discussed with the patient, family and they are in agreement.  CODE STATUS:  Advance Directive Documentation        Most Recent Value   Type of Advance Directive  Living will, Healthcare Power of Attorney   Pre-existing out of facility DNR order (yellow form or pink MOST form)     "MOST" Form in Place?        TOTAL TIME TAKING CARE OF THIS PATIENT ON DAY OF  DISCHARGE: more than 30 minutes.    Hillary Bow R M.D on 12/15/2014 at 12:41 PM  Between 7am to 6pm - Pager - [phone]  After 6pm go to [email or web address] - password [email or web address] [facility]  Office  [phone]  CC: Primary care physician; Ollen Bowl, MD     Note: This dictation was prepared with Dragon dictation along with smaller phrase technology. Any transcriptional errors that result from this process are unintentional.

## 2014-12-27 ENCOUNTER — Encounter: Payer: Self-pay | Admitting: Gastroenterology

## 2015-05-07 ENCOUNTER — Other Ambulatory Visit: Payer: Self-pay | Admitting: Specialist

## 2015-05-07 DIAGNOSIS — J90 Pleural effusion, not elsewhere classified: Secondary | ICD-10-CM

## 2015-05-07 DIAGNOSIS — R0609 Other forms of dyspnea: Principal | ICD-10-CM

## 2015-05-09 ENCOUNTER — Ambulatory Visit
Admission: RE | Admit: 2015-05-09 | Discharge: 2015-05-09 | Disposition: A | Discharge status: Home / Self Care | Payer: Medicare Other | Source: Ambulatory Visit | Attending: Specialist | Admitting: Specialist

## 2015-05-09 ENCOUNTER — Other Ambulatory Visit: Payer: Self-pay | Admitting: Specialist

## 2015-05-09 DIAGNOSIS — R0609 Other forms of dyspnea: Secondary | ICD-10-CM | POA: Insufficient documentation

## 2015-05-09 DIAGNOSIS — Z9889 Other specified postprocedural states: Secondary | ICD-10-CM | POA: Diagnosis not present

## 2015-05-09 DIAGNOSIS — J90 Pleural effusion, not elsewhere classified: Secondary | ICD-10-CM | POA: Diagnosis present

## 2015-05-09 LAB — BODY FLUID CELL COUNT WITH DIFFERENTIAL
EOS FL: 0 %
Lymphs, Fluid: 52 %
Monocyte-Macrophage-Serous Fluid: 21 %
NEUTROPHIL FLUID: 27 %
OTHER CELLS FL: 0 %
WBC FLUID: 307 uL

## 2015-05-09 LAB — LACTATE DEHYDROGENASE, PLEURAL OR PERITONEAL FLUID: LD, Fluid: 80 U/L — ABNORMAL HIGH (ref 3–23)

## 2015-05-09 LAB — PROTEIN, BODY FLUID

## 2015-05-09 LAB — GLUCOSE, SEROUS FLUID: Glucose, Fluid: 115 mg/dL

## 2015-05-09 LAB — PROTEIN, TOTAL

## 2015-05-09 NOTE — Procedures (Signed)
US guided right thoracentesis.  Removed 800 ml of yellow fluid without complication.  Will get CXR.

## 2015-05-09 NOTE — Discharge Instructions (Signed)
Thoracentesis, Care After °Refer to this sheet in the next few weeks. These instructions provide you with information about caring for yourself after your procedure. Your health care provider may also give you more specific instructions. Your treatment has been planned according to current medical practices, but problems sometimes occur. Call your health care provider if you have any problems or questions after your procedure. °WHAT TO EXPECT AFTER THE PROCEDURE °After your procedure, it is common to have pain at the puncture site. °HOME CARE INSTRUCTIONS °· Take medicines only as directed by your health care provider. °· You may return to your normal diet and normal activities as directed by your health care provider. °· Drink enough fluid to keep your urine clear or pale yellow. °· Do not take baths, swim, or use a hot tub until your health care provider approves. °· Follow your health care provider's instructions about: °¨ Puncture site care. °¨ Bandage (dressing) changes and removal. °· Check your puncture site every day for signs of infection. Watch for: °¨ Redness, swelling, or pain. °¨ Fluid, blood, or pus. °· Keep all follow-up visits as directed by your health care provider. This is important. °SEEK MEDICAL CARE IF: °· You have redness, swelling, or pain at your puncture site. °· You have fluid, blood, or pus coming from your puncture site. °· You have a fever. °· You have chills. °· You have nausea or vomiting. °· You have trouble breathing. °· You develop a worsening cough. °SEEK IMMEDIATE MEDICAL CARE IF: °· You have extreme shortness of breath. °· You develop chest pain. °· You faint or feel light-headed. °  °This information is not intended to replace advice given to you by your health care provider. Make sure you discuss any questions you have with your health care provider. °  °Document Released: 03/16/2014 Document Reviewed: 03/16/2014 °Elsevier Interactive Patient Education ©2016 [employer] ° °

## 2015-05-13 LAB — CYTOLOGY - NON PAP

## 2015-05-15 LAB — BODY FLUID CULTURE: CULTURE: NO GROWTH

## 2015-05-30 LAB — CULTURE, FUNGUS WITHOUT SMEAR

## 2015-07-03 ENCOUNTER — Other Ambulatory Visit: Payer: Self-pay | Admitting: Specialist

## 2015-07-03 DIAGNOSIS — J90 Pleural effusion, not elsewhere classified: Secondary | ICD-10-CM

## 2015-07-04 ENCOUNTER — Ambulatory Visit
Admission: RE | Admit: 2015-07-04 | Discharge: 2015-07-04 | Disposition: A | Discharge status: Home / Self Care | Payer: Medicare Other | Source: Ambulatory Visit | Attending: Specialist | Admitting: Specialist

## 2015-07-04 DIAGNOSIS — J90 Pleural effusion, not elsewhere classified: Secondary | ICD-10-CM | POA: Diagnosis present

## 2015-07-04 DIAGNOSIS — R918 Other nonspecific abnormal finding of lung field: Secondary | ICD-10-CM | POA: Diagnosis not present

## 2015-07-04 LAB — BODY FLUID CELL COUNT WITH DIFFERENTIAL
EOS FL: 0 %
LYMPHS FL: 56 %
MONOCYTE-MACROPHAGE-SEROUS FLUID: 0 %
NEUTROPHIL FLUID: 44 %
OTHER CELLS FL: 0 %
Total Nucleated Cell Count, Fluid: 21 cu mm

## 2015-07-04 LAB — PROTEIN, BODY FLUID: Total protein, fluid: <3 g/dL

## 2015-07-04 LAB — LACTATE DEHYDROGENASE, PLEURAL OR PERITONEAL FLUID: LD, Fluid: 75 U/L — ABNORMAL HIGH (ref 3–23)

## 2015-07-04 LAB — GLUCOSE, SEROUS FLUID: Glucose, Fluid: 109 mg/dL

## 2015-07-04 NOTE — Procedures (Signed)
Successful RT THORACENTESIS 925CC  No comp Stable cxr pendnig Full report in PACS

## 2015-07-06 LAB — MISC LABCORP TEST (SEND OUT): Labcorp test code: 19588

## 2015-07-08 LAB — CYTOLOGY - NON PAP

## 2015-07-09 ENCOUNTER — Other Ambulatory Visit: Payer: Self-pay | Admitting: Specialist

## 2015-07-09 DIAGNOSIS — J90 Pleural effusion, not elsewhere classified: Secondary | ICD-10-CM

## 2015-07-10 ENCOUNTER — Ambulatory Visit
Admission: RE | Admit: 2015-07-10 | Discharge: 2015-07-10 | Disposition: A | Discharge status: Home / Self Care | Payer: Medicare Other | Source: Ambulatory Visit | Attending: Specialist | Admitting: Specialist

## 2015-07-10 DIAGNOSIS — J90 Pleural effusion, not elsewhere classified: Secondary | ICD-10-CM | POA: Diagnosis present

## 2015-07-10 DIAGNOSIS — Z9889 Other specified postprocedural states: Secondary | ICD-10-CM | POA: Insufficient documentation

## 2015-07-10 LAB — BODY FLUID CELL COUNT WITH DIFFERENTIAL
Eos, Fluid: 0 %
Lymphs, Fluid: 61 %
Monocyte-Macrophage-Serous Fluid: 15 %
Neutrophil Count, Fluid: 24 %
Other Cells, Fluid: 0 %
Total Nucleated Cell Count, Fluid: 239 cu mm

## 2015-07-10 LAB — PROTEIN, BODY FLUID: Total protein, fluid: <3 g/dL

## 2015-07-10 LAB — LACTATE DEHYDROGENASE, PLEURAL OR PERITONEAL FLUID: LD, Fluid: 114 U/L — ABNORMAL HIGH (ref 3–23)

## 2015-07-10 LAB — GLUCOSE, SEROUS FLUID: GLUCOSE FL: 107 mg/dL

## 2015-07-11 LAB — CYTOLOGY - NON PAP

## 2015-07-11 LAB — BODY FLUID CULTURE: Culture: NO GROWTH

## 2015-07-11 LAB — MISC LABCORP TEST (SEND OUT): Labcorp test code: 19588

## 2015-07-14 LAB — CULTURE, BODY FLUID W GRAM STAIN -BOTTLE: Culture: NO GROWTH

## 2015-07-14 LAB — CULTURE, BODY FLUID-BOTTLE

## 2015-07-23 LAB — ACID FAST CULTURE WITH REFLEXED SENSITIVITIES
ACID FAST CULTURE - AFSCU3: NEGATIVE
SOURCE OF SAMPLE SRC33: 183764

## 2015-07-23 LAB — ACID FAST SMEAR (AFB)

## 2015-07-23 LAB — ACID FAST SMEAR (AFB, MYCOBACTERIA): Acid Fast Smear: NEGATIVE

## 2015-07-25 LAB — CULTURE, FUNGUS WITHOUT SMEAR

## 2015-07-31 ENCOUNTER — Inpatient Hospital Stay: Payer: Medicare Other

## 2015-07-31 ENCOUNTER — Inpatient Hospital Stay
Admission: EM | Admit: 2015-07-31 | Discharge: 2015-08-05 | DRG: 292 | Disposition: A | Discharge status: Home / Self Care | Payer: Medicare Other | Attending: Internal Medicine | Admitting: Internal Medicine

## 2015-07-31 ENCOUNTER — Encounter: Payer: Self-pay | Admitting: *Deleted

## 2015-07-31 ENCOUNTER — Emergency Department: Payer: Medicare Other

## 2015-07-31 DIAGNOSIS — Z9889 Other specified postprocedural states: Secondary | ICD-10-CM | POA: Diagnosis not present

## 2015-07-31 DIAGNOSIS — I5043 Acute on chronic combined systolic (congestive) and diastolic (congestive) heart failure: Principal | ICD-10-CM | POA: Diagnosis present

## 2015-07-31 DIAGNOSIS — M7989 Other specified soft tissue disorders: Secondary | ICD-10-CM

## 2015-07-31 DIAGNOSIS — Z825 Family history of asthma and other chronic lower respiratory diseases: Secondary | ICD-10-CM | POA: Diagnosis not present

## 2015-07-31 DIAGNOSIS — I872 Venous insufficiency (chronic) (peripheral): Secondary | ICD-10-CM | POA: Diagnosis present

## 2015-07-31 DIAGNOSIS — I272 Other secondary pulmonary hypertension: Secondary | ICD-10-CM | POA: Diagnosis present

## 2015-07-31 DIAGNOSIS — K219 Gastro-esophageal reflux disease without esophagitis: Secondary | ICD-10-CM | POA: Diagnosis present

## 2015-07-31 DIAGNOSIS — I959 Hypotension, unspecified: Secondary | ICD-10-CM | POA: Diagnosis present

## 2015-07-31 DIAGNOSIS — N183 Chronic kidney disease, stage 3 (moderate): Secondary | ICD-10-CM | POA: Diagnosis present

## 2015-07-31 DIAGNOSIS — Z79899 Other long term (current) drug therapy: Secondary | ICD-10-CM

## 2015-07-31 DIAGNOSIS — Z8249 Family history of ischemic heart disease and other diseases of the circulatory system: Secondary | ICD-10-CM

## 2015-07-31 DIAGNOSIS — Z880 Allergy status to penicillin: Secondary | ICD-10-CM

## 2015-07-31 DIAGNOSIS — Z951 Presence of aortocoronary bypass graft: Secondary | ICD-10-CM | POA: Diagnosis not present

## 2015-07-31 DIAGNOSIS — Z888 Allergy status to other drugs, medicaments and biological substances status: Secondary | ICD-10-CM | POA: Diagnosis not present

## 2015-07-31 DIAGNOSIS — I429 Cardiomyopathy, unspecified: Secondary | ICD-10-CM | POA: Diagnosis present

## 2015-07-31 DIAGNOSIS — N179 Acute kidney failure, unspecified: Secondary | ICD-10-CM | POA: Diagnosis present

## 2015-07-31 DIAGNOSIS — I5023 Acute on chronic systolic (congestive) heart failure: Secondary | ICD-10-CM | POA: Insufficient documentation

## 2015-07-31 DIAGNOSIS — J90 Pleural effusion, not elsewhere classified: Secondary | ICD-10-CM | POA: Diagnosis present

## 2015-07-31 DIAGNOSIS — I5033 Acute on chronic diastolic (congestive) heart failure: Secondary | ICD-10-CM | POA: Diagnosis present

## 2015-07-31 DIAGNOSIS — R000 Tachycardia, unspecified: Secondary | ICD-10-CM | POA: Diagnosis present

## 2015-07-31 DIAGNOSIS — Z801 Family history of malignant neoplasm of trachea, bronchus and lung: Secondary | ICD-10-CM

## 2015-07-31 DIAGNOSIS — I2510 Atherosclerotic heart disease of native coronary artery without angina pectoris: Secondary | ICD-10-CM | POA: Diagnosis present

## 2015-07-31 DIAGNOSIS — Z95810 Presence of automatic (implantable) cardiac defibrillator: Secondary | ICD-10-CM | POA: Diagnosis not present

## 2015-07-31 DIAGNOSIS — N189 Chronic kidney disease, unspecified: Secondary | ICD-10-CM

## 2015-07-31 DIAGNOSIS — Z95828 Presence of other vascular implants and grafts: Secondary | ICD-10-CM

## 2015-07-31 DIAGNOSIS — Z9010 Acquired absence of unspecified breast and nipple: Secondary | ICD-10-CM

## 2015-07-31 DIAGNOSIS — Z7982 Long term (current) use of aspirin: Secondary | ICD-10-CM

## 2015-07-31 DIAGNOSIS — I4891 Unspecified atrial fibrillation: Secondary | ICD-10-CM | POA: Diagnosis present

## 2015-07-31 LAB — BASIC METABOLIC PANEL
ANION GAP: 11 (ref 5–15)
BUN: 44 mg/dL — ABNORMAL HIGH (ref 6–20)
CALCIUM: 8.9 mg/dL (ref 8.9–10.3)
CO2: 26 mmol/L (ref 22–32)
Chloride: 95 mmol/L — ABNORMAL LOW (ref 101–111)
Creatinine, Ser: 2.39 mg/dL — ABNORMAL HIGH (ref 0.44–1.00)
GFR, EST AFRICAN AMERICAN: 21 mL/min — AB (ref >60)
GFR, EST NON AFRICAN AMERICAN: 18 mL/min — AB (ref >60)
Glucose, Bld: 102 mg/dL — ABNORMAL HIGH (ref 65–99)
Potassium: 4.5 mmol/L (ref 3.5–5.1)
SODIUM: 132 mmol/L — AB (ref 135–145)

## 2015-07-31 LAB — COMPREHENSIVE METABOLIC PANEL
ALBUMIN: 2.9 g/dL — AB (ref 3.5–5.0)
ALK PHOS: 84 U/L (ref 38–126)
ALT: 14 U/L (ref 14–54)
AST: 26 U/L (ref 15–41)
Anion gap: 9 (ref 5–15)
BUN: 40 mg/dL — AB (ref 6–20)
CALCIUM: 8.7 mg/dL — AB (ref 8.9–10.3)
CO2: 29 mmol/L (ref 22–32)
CREATININE: 2 mg/dL — AB (ref 0.44–1.00)
Chloride: 96 mmol/L — ABNORMAL LOW (ref 101–111)
GFR calc Af Amer: 26 mL/min — ABNORMAL LOW (ref >60)
GFR, EST NON AFRICAN AMERICAN: 22 mL/min — AB (ref >60)
GLUCOSE: 127 mg/dL — AB (ref 65–99)
Potassium: 4.1 mmol/L (ref 3.5–5.1)
Sodium: 134 mmol/L — ABNORMAL LOW (ref 135–145)
TOTAL PROTEIN: 6 g/dL — AB (ref 6.5–8.1)
Total Bilirubin: 1.2 mg/dL (ref 0.3–1.2)

## 2015-07-31 LAB — CULTURE, FUNGUS WITHOUT SMEAR

## 2015-07-31 LAB — TSH: TSH: 6.269 u[IU]/mL — ABNORMAL HIGH (ref 0.350–4.500)

## 2015-07-31 LAB — CBC
HCT: 36.6 % (ref 35.0–47.0)
HEMOGLOBIN: 12 g/dL (ref 12.0–16.0)
MCH: 27.3 pg (ref 26.0–34.0)
MCHC: 32.9 g/dL (ref 32.0–36.0)
MCV: 83.1 fL (ref 80.0–100.0)
Platelets: 205 10*3/uL (ref 150–440)
RBC: 4.41 MIL/uL (ref 3.80–5.20)
RDW: 16.1 % — ABNORMAL HIGH (ref 11.5–14.5)
WBC: 6.4 10*3/uL (ref 3.6–11.0)

## 2015-07-31 LAB — MRSA PCR SCREENING: MRSA by PCR: NEGATIVE

## 2015-07-31 LAB — HEMOGLOBIN A1C: Hgb A1c MFr Bld: 6.7 % — ABNORMAL HIGH (ref 4.0–6.0)

## 2015-07-31 LAB — PROTIME-INR
INR: 1.56
PROTHROMBIN TIME: 18.7 s — AB (ref 11.4–15.0)

## 2015-07-31 LAB — ALBUMIN: Albumin: 3.3 g/dL — ABNORMAL LOW (ref 3.5–5.0)

## 2015-07-31 LAB — TROPONIN I: TROPONIN I: 0.05 ng/mL — AB (ref <0.031)

## 2015-07-31 LAB — GLUCOSE, CAPILLARY: Glucose-Capillary: 80 mg/dL (ref 65–99)

## 2015-07-31 LAB — BRAIN NATRIURETIC PEPTIDE

## 2015-07-31 MED ORDER — FUROSEMIDE 10 MG/ML IJ SOLN
INTRAMUSCULAR | Status: AC
Start: 2015-07-31 — End: 2015-07-31
  Administered 2015-07-31: 10 mg via INTRAVENOUS
  Filled 2015-07-31: qty 4

## 2015-07-31 MED ORDER — CETYLPYRIDINIUM CHLORIDE 0.05 % MT LIQD
7.0000 mL | Freq: Two times a day (BID) | OROMUCOSAL | Status: DC
  Administered 2015-07-31 – 2015-08-05 (×11): 7 mL via OROMUCOSAL

## 2015-07-31 MED ORDER — WARFARIN SODIUM 5 MG PO TABS
5.0000 mg | ORAL_TABLET | Freq: Once | ORAL | Status: AC
  Administered 2015-07-31: 5 mg via ORAL
  Filled 2015-07-31: qty 1

## 2015-07-31 MED ORDER — METOPROLOL SUCCINATE ER 25 MG PO TB24
25.0000 mg | ORAL_TABLET | Freq: Every day | ORAL | Status: DC
  Administered 2015-08-02 – 2015-08-05 (×4): 25 mg via ORAL
  Filled 2015-07-31 (×5): qty 1

## 2015-07-31 MED ORDER — FUROSEMIDE 10 MG/ML IJ SOLN
80.0000 mg | Freq: Once | INTRAMUSCULAR | Status: DC
  Filled 2015-07-31: qty 8

## 2015-07-31 MED ORDER — METOLAZONE 2.5 MG PO TABS
2.5000 mg | ORAL_TABLET | ORAL | Status: DC
  Filled 2015-07-31: qty 1

## 2015-07-31 MED ORDER — ATORVASTATIN CALCIUM 20 MG PO TABS
20.0000 mg | ORAL_TABLET | Freq: Every day | ORAL | Status: DC
  Administered 2015-07-31 – 2015-08-04 (×5): 20 mg via ORAL
  Filled 2015-07-31 (×5): qty 1

## 2015-07-31 MED ORDER — DRONEDARONE HCL 400 MG PO TABS
400.0000 mg | ORAL_TABLET | Freq: Two times a day (BID) | ORAL | Status: DC
  Administered 2015-07-31 – 2015-08-05 (×11): 400 mg via ORAL
  Filled 2015-07-31 (×13): qty 1

## 2015-07-31 MED ORDER — MORPHINE SULFATE (PF) 2 MG/ML IV SOLN: 1.0000 mg | INTRAVENOUS | Status: DC | PRN

## 2015-07-31 MED ORDER — PANTOPRAZOLE SODIUM 40 MG PO TBEC
40.0000 mg | DELAYED_RELEASE_TABLET | Freq: Every day | ORAL | Status: DC
  Administered 2015-07-31 – 2015-08-05 (×6): 40 mg via ORAL
  Filled 2015-07-31 (×7): qty 1

## 2015-07-31 MED ORDER — DOPAMINE-DEXTROSE 3.2-5 MG/ML-% IV SOLN
0.0000 ug/kg/min | INTRAVENOUS | Status: DC
  Administered 2015-07-31: 5 ug/kg/min via INTRAVENOUS
  Filled 2015-07-31: qty 250

## 2015-07-31 MED ORDER — METOLAZONE 2.5 MG PO TABS
2.5000 mg | ORAL_TABLET | Freq: Once | ORAL | Status: AC
  Administered 2015-07-31: 2.5 mg via ORAL
  Filled 2015-07-31: qty 1

## 2015-07-31 MED ORDER — FUROSEMIDE 10 MG/ML IJ SOLN
4.0000 mg/h | INTRAVENOUS | Status: DC
  Administered 2015-07-31 (×2): 4 mg/h via INTRAVENOUS
  Filled 2015-07-31 (×2): qty 25

## 2015-07-31 MED ORDER — GABAPENTIN 100 MG PO CAPS
100.0000 mg | ORAL_CAPSULE | Freq: Two times a day (BID) | ORAL | Status: DC
  Administered 2015-07-31 – 2015-08-05 (×11): 100 mg via ORAL
  Filled 2015-07-31 (×11): qty 1

## 2015-07-31 MED ORDER — FUROSEMIDE 10 MG/ML IJ SOLN
10.0000 mg | Freq: Once | INTRAMUSCULAR | Status: AC
  Administered 2015-07-31: 10 mg via INTRAVENOUS

## 2015-07-31 MED ORDER — SPIRONOLACTONE 25 MG PO TABS
12.5000 mg | ORAL_TABLET | Freq: Every day | ORAL | Status: DC
  Administered 2015-07-31 – 2015-08-05 (×6): 12.5 mg via ORAL
  Filled 2015-07-31 (×7): qty 1

## 2015-07-31 MED ORDER — WARFARIN SODIUM 3 MG PO TABS
3.0000 mg | ORAL_TABLET | Freq: Every day | ORAL | Status: DC
  Administered 2015-07-31 – 2015-08-04 (×5): 3 mg via ORAL
  Filled 2015-07-31 (×4): qty 3
  Filled 2015-07-31: qty 1
  Filled 2015-07-31: qty 3
  Filled 2015-07-31: qty 1

## 2015-07-31 MED ORDER — FUROSEMIDE 40 MG PO TABS: 40.0000 mg | ORAL_TABLET | Freq: Every day | ORAL | Status: DC

## 2015-07-31 MED ORDER — ASPIRIN EC 81 MG PO TBEC
81.0000 mg | DELAYED_RELEASE_TABLET | Freq: Every day | ORAL | Status: DC
  Administered 2015-07-31 – 2015-08-05 (×6): 81 mg via ORAL
  Filled 2015-07-31 (×6): qty 1

## 2015-07-31 MED ORDER — FUROSEMIDE 10 MG/ML IJ SOLN: 10.0000 mg | Freq: Once | INTRAMUSCULAR | Status: DC

## 2015-07-31 MED ORDER — VITAMIN D 1000 UNITS PO TABS
2000.0000 [IU] | ORAL_TABLET | Freq: Every day | ORAL | Status: DC
  Administered 2015-07-31 – 2015-08-05 (×6): 2000 [IU] via ORAL
  Filled 2015-07-31 (×6): qty 2

## 2015-07-31 MED ORDER — ONDANSETRON HCL 4 MG PO TABS: 4.0000 mg | ORAL_TABLET | Freq: Four times a day (QID) | ORAL | Status: DC | PRN

## 2015-07-31 MED ORDER — CALCIUM CARBONATE-VITAMIN D 500-200 MG-UNIT PO TABS
1.0000 | ORAL_TABLET | Freq: Every day | ORAL | Status: DC
  Administered 2015-07-31 – 2015-08-05 (×6): 1 via ORAL
  Filled 2015-07-31 (×6): qty 1

## 2015-07-31 MED ORDER — ACETAMINOPHEN 650 MG RE SUPP: 650.0000 mg | Freq: Four times a day (QID) | RECTAL | Status: DC | PRN

## 2015-07-31 MED ORDER — POTASSIUM CHLORIDE CRYS ER 10 MEQ PO TBCR
30.0000 meq | EXTENDED_RELEASE_TABLET | Freq: Two times a day (BID) | ORAL | Status: DC
  Administered 2015-07-31 – 2015-08-05 (×11): 30 meq via ORAL
  Filled 2015-07-31 (×11): qty 3

## 2015-07-31 MED ORDER — VITAMIN B-12 1000 MCG PO TABS
1000.0000 ug | ORAL_TABLET | Freq: Every day | ORAL | Status: DC
  Administered 2015-07-31 – 2015-08-05 (×6): 1000 ug via ORAL
  Filled 2015-07-31 (×6): qty 1

## 2015-07-31 MED ORDER — SODIUM CHLORIDE 0.9% FLUSH
3.0000 mL | Freq: Two times a day (BID) | INTRAVENOUS | Status: DC
  Administered 2015-07-31 – 2015-08-05 (×11): 3 mL via INTRAVENOUS

## 2015-07-31 MED ORDER — WARFARIN - PHARMACIST DOSING INPATIENT
Freq: Every day | Status: DC
  Administered 2015-07-31 – 2015-08-04 (×5)

## 2015-07-31 MED ORDER — WARFARIN SODIUM 5 MG PO TABS: 5.0000 mg | ORAL_TABLET | Freq: Every day | ORAL | Status: DC

## 2015-07-31 MED ORDER — DOCUSATE SODIUM 100 MG PO CAPS
100.0000 mg | ORAL_CAPSULE | Freq: Two times a day (BID) | ORAL | Status: DC
  Administered 2015-07-31 – 2015-08-05 (×11): 100 mg via ORAL
  Filled 2015-07-31 (×11): qty 1

## 2015-07-31 MED ORDER — FUROSEMIDE 10 MG/ML IJ SOLN
10.0000 mg | Freq: Every day | INTRAMUSCULAR | Status: DC
  Filled 2015-07-31: qty 2

## 2015-07-31 MED ORDER — ONDANSETRON HCL 4 MG/2ML IJ SOLN: 4.0000 mg | Freq: Four times a day (QID) | INTRAMUSCULAR | Status: DC | PRN

## 2015-07-31 MED ORDER — ACETAMINOPHEN 325 MG PO TABS: 650.0000 mg | ORAL_TABLET | Freq: Four times a day (QID) | ORAL | Status: DC | PRN

## 2015-07-31 MED ORDER — DIGOXIN 125 MCG PO TABS
0.1250 mg | ORAL_TABLET | Freq: Every day | ORAL | Status: DC
  Administered 2015-07-31 – 2015-08-05 (×6): 0.125 mg via ORAL
  Filled 2015-07-31 (×6): qty 1

## 2015-07-31 NOTE — ED Notes (Addendum)
Pt to ED by POV with SOB x 1 week. Pt states "worse at night when laying down" Pt also states put on 2 lbs since this am and a total of 4 lbs this week. Pt talking complete and full sentences at this time, sating 100% on RA.

## 2015-07-31 NOTE — Progress Notes (Signed)
[facility]  ROUNDING NOTE   Subjective:  The patient is well known to [place] from an admission in August 2016. She presents now with increasing weight, increasing shortness of breath in the setting of known heart failure. She follows with Dr. Saralyn Pilar closely.   He recently saw her in the office and added spinal lactone to her diuretic regimen including Lasix 40 mg by mouth twice a day as well as metolazone on Mondays, Wednesdays, and Fridays. She has acute renal failure now with a creatinine up to 2.39. She was on dopamine however this drove her heart rate up therefore this was discontinued.   Objective:  Vital signs in last 24 hours:  Temp:  [97.8 F (36.6 C)] 97.8 F (36.6 C) (05/24 0700) Pulse Rate:  [43-108] 105 (05/24 0800) Resp:  [12-27] 15 (05/24 0800) BP: (70-108)/(54-82) 86/54 mmHg (05/24 0917) SpO2:  [86 %-100 %] 97 % (05/24 0800) Weight:  [47.3 kg (104 lb 4.4 oz)] 47.3 kg (104 lb 4.4 oz) (05/24 0423)  Weight change:  Filed Weights   07/31/15 0423  Weight: 47.3 kg (104 lb 4.4 oz)    Intake/Output: I/O last 3 completed shifts: In: -  Out: 210 [Urine:210]   Intake/Output this shift:     Physical Exam: General: NAD, sitting up in bed  Head: Normocephalic, atraumatic. Moist oral mucosal membranes  Eyes: Anicteric  Neck: Supple, trachea midline  Lungs:  Basilar rales  Heart: S1S2 irregular no rubs  Abdomen:  Soft, nontender, BS present  Extremities: 2+ peripheral edema.  Neurologic: Nonfocal, moving all four extremities  Skin: No lesions       Basic Metabolic Panel:  Recent Labs Lab 07/31/15 0049  NA 132*  K 4.5  CL 95*  CO2 26  GLUCOSE 102*  BUN 44*  CREATININE 2.39*  CALCIUM 8.9    Liver Function Tests:  Recent Labs Lab 07/31/15 0049  ALBUMIN 3.3*   No results for input(s): LIPASE, AMYLASE in the last 168 hours. No results for input(s): AMMONIA in the last 168 hours.  CBC:  Recent Labs Lab 07/31/15 0049  WBC 6.4   HGB 12.0  HCT 36.6  MCV 83.1  PLT 205    Cardiac Enzymes:  Recent Labs Lab 07/31/15 0049  TROPONINI 0.05*    BNP: Invalid input(s): POCBNP  CBG:  Recent Labs Lab 07/31/15 0423  GLUCAP 80    Microbiology: Results for orders placed or performed during the hospital encounter of 07/31/15  MRSA PCR Screening     Status: None   Collection Time: 07/31/15  4:30 AM  Result Value Ref Range Status   MRSA by PCR NEGATIVE NEGATIVE Final    Comment:        The GeneXpert MRSA Assay (FDA approved for NASAL specimens only), is one component of a comprehensive MRSA colonization surveillance program. It is not intended to diagnose MRSA infection nor to guide or monitor treatment for MRSA infections.     Coagulation Studies:  Recent Labs  07/31/15 0049  LABPROT 18.7*  INR 1.56    Urinalysis: No results for input(s): COLORURINE, LABSPEC, PHURINE, GLUCOSEU, HGBUR, BILIRUBINUR, KETONESUR, PROTEINUR, UROBILINOGEN, NITRITE, LEUKOCYTESUR in the last 72 hours.  Invalid input(s): APPERANCEUR    Imaging: Dg Chest Port 1 View  07/31/2015  CLINICAL DATA:  Acute onset of shortness of breath. Initial encounter. EXAM: PORTABLE CHEST 1 VIEW COMPARISON:  Chest radiograph performed 07/10/2015 FINDINGS: Small bilateral pleural effusions are noted. Bibasilar airspace opacities raise concern for pulmonary edema. No  pneumothorax is seen. The cardiomediastinal silhouette is enlarged. The patient is status post median sternotomy. A pacemaker/AICD is noted overlying the right chest wall, with leads ending overlying the right atrium and right ventricle. No acute osseous abnormalities are identified. IMPRESSION: Small bilateral pleural effusions noted. Bibasilar airspace opacities raise concern for pulmonary edema. Cardiomegaly noted. Electronically Signed   By: Garald Balding M.D.   On: 07/31/2015 01:03     Medications:   . DOPamine Stopped (07/31/15 0745)   . antiseptic oral rinse  7 mL  Mouth Rinse BID  . aspirin EC  81 mg Oral Daily  . atorvastatin  20 mg Oral QHS  . calcium-vitamin D  1 tablet Oral Daily  . cholecalciferol  2,000 Units Oral Daily  . digoxin  0.125 mg Oral Daily  . docusate sodium  100 mg Oral BID  . dronedarone  400 mg Oral BID WC  . furosemide  10 mg Intravenous Daily  . gabapentin  100 mg Oral BID  . metoprolol succinate  25 mg Oral Daily  . pantoprazole  40 mg Oral QAC breakfast  . potassium chloride  30 mEq Oral BID  . sodium chloride flush  3 mL Intravenous Q12H  . spironolactone  12.5 mg Oral Daily  . vitamin B-12  1,000 mcg Oral Daily  . warfarin  3 mg Oral q1800  . Warfarin - Pharmacist Dosing Inpatient   Does not apply q1800   acetaminophen **OR** acetaminophen, morphine injection, ondansetron **OR** ondansetron (ZOFRAN) IV  Assessment/ Plan:  82 y.o. female with a PMHx of chronic systolic heart faiulre EF 15%, atrial fibrillation, coronary artery disease, CKD stage III baseline Cr 1.03 with egfr of 26, pulmonary hypertension, coronary artery disease s/p CABG, history of pacemaker placement who presents now with shortness of breath due to heart failure exacerbation.  1.  Acute renal failure/chronic and disease stage III Baseline creatinine 1.03 with EGFR 56. It appears that the patient has had another episode of acute renal failure likely secondary to altered cardiorenal hemodynamics. She has had weight gain and increasing lower extremity edema. - We will start the patient on a Lasix drip. This will likely be required for several days. She will also need blood draws. Therefore we recommend placement of IJ PICC for this purpose.  2. Acute systolic heart failure exacerbation. She is in need of a furosemide drip at this point in time. See plan above. Cardiology has been consulted.  3. Hypotension. Likely related to acute systolic heart failure. Awaiting further cardiology input. She mandible requiring pressors to maintain her pressure. Consider  norepinephrine in place of dopamine given the fact that she had tachycardia with dopamine.  4. Thanks for consultation.   LOS: 0 Bera Pinela 5/24/20179:28 AM

## 2015-07-31 NOTE — Progress Notes (Signed)
Dr. Margaretmary Eddy notified of pt's HR increasing to 140's on dopamine drip. Orders to stop dopamine and continue to monitor. MD aware of pt's 8 beat run on vtach. Cardiology consult ordered.

## 2015-07-31 NOTE — Progress Notes (Signed)
ANTICOAGULATION CONSULT NOTE - Initial Consult  Pharmacy Consult for VKA Indication: atrial fibrillation  Allergies  Allergen Reactions  . Biaxin [Clarithromycin] Other (See Comments)    Reaction:  Numbness of lips   . Flagyl [Metronidazole] Other (See Comments)    Reaction:  Numbness of lips   . Penicillins Rash and Other (See Comments)    Pt is unable to answer additional questions about this medication because it happened so long ago.  . Tetracyclines & Related Rash    Patient Measurements: Height: 5\' 4"  (162.6 cm) Weight: 104 lb 4.4 oz (47.3 kg) IBW/kg (Calculated) : 54.7 Heparin Dosing Weight:   Vital Signs: Temp: 97.8 F (36.6 C) (05/24 0423) Temp Source: Oral (05/24 0423) BP: 108/68 mmHg (05/24 0423) Pulse Rate: 86 (05/24 0423)  Labs:  Recent Labs  07/31/15 0049  HGB 12.0  HCT 36.6  PLT 205  LABPROT 18.7*  INR 1.56  CREATININE 2.39*  TROPONINI 0.05*    Estimated Creatinine Clearance: 13.8 mL/min (by C-G formula based on Cr of 2.39).   Medical History: Past Medical History  Diagnosis Date  . Atrial fibrillation ([place])   . CHF (congestive heart failure) ([place])   . Shingles   . CAD (coronary artery disease)   . Pulmonary hypertension ([place])   . GERD (gastroesophageal reflux disease)   . CKD (chronic kidney disease)     Medications:  Infusions:    Assessment: 83 yof cc SOB progressive x 1 week with weight gain. Has been holding VKA for supratherapeutic INR.   Goal of Therapy:  INR 2-3 Monitor platelets by anticoagulation protocol: Yes   Plan:  INR 1.58 now, PTA lists warfarin 5 mg po three times a week and 2.5 mg po four times a week for 25 mg weekly. Will give 5 mg po x 1 now and start 3 mg po daily this evening. Pharmacy to follow and adjust as needed to maintain INR 2 to 3.   Laural Benes, Pharm.D., BCPS Clinical Pharmacist 07/31/2015,4:38 AM

## 2015-07-31 NOTE — ED Notes (Signed)
EDP notified of elevated troponin.  

## 2015-07-31 NOTE — Progress Notes (Signed)
[facility] [place] NAME: Nikeria Goldring    MR#:  [id number]  DATE OF BIRTH:  February 06, 1932  SUBJECTIVE:  CHIEF COMPLAINT:  Patient is denying any shortness of breath while resting. Denies any chest pain. Had ventricle are tachycardia while she was on dopamine which was discontinued. Patient denies any dizziness. Daughter at bedside  REVIEW OF SYSTEMS:  CONSTITUTIONAL: No fever, fatigue or weakness.  EYES: No blurred or double vision.  EARS, NOSE, AND THROAT: No tinnitus or ear pain.  RESPIRATORY: No cough,Reporting exertional shortness of breath, denies any wheezing or hemoptysis.  CARDIOVASCULAR: No chest pain, orthopnea, edema.  GASTROINTESTINAL: No nausea, vomiting, diarrhea or abdominal pain.  GENITOURINARY: No dysuria, hematuria.  ENDOCRINE: No polyuria, nocturia,  HEMATOLOGY: No anemia, easy bruising or bleeding SKIN: No rash or lesion. MUSCULOSKELETAL: No joint pain or arthritis.   NEUROLOGIC: No tingling, numbness, weakness.  PSYCHIATRY: No anxiety or depression.   DRUG ALLERGIES:   Allergies  Allergen Reactions  . Biaxin [Clarithromycin] Other (See Comments)    Reaction:  Numbness of lips   . Flagyl [Metronidazole] Other (See Comments)    Reaction:  Numbness of lips   . Penicillins Rash and Other (See Comments)    Pt is unable to answer additional questions about this medication because it happened so long ago.  . Tetracyclines & Related Rash    VITALS:  Blood pressure 87/61, pulse 106, temperature 97.8 F (36.6 C), temperature source Axillary, resp. rate 23, height 5\' 4"  (1.626 m), weight 47.3 kg (104 lb 4.4 oz), SpO2 97 %.  PHYSICAL EXAMINATION:  GENERAL:  82 y.o.-year-old patient lying in the bed with no acute distress.  EYES: Pupils equal, round, reactive to light and accommodation. No scleral icterus. Extraocular muscles intact.  HEENT: Head atraumatic, normocephalic. Oropharynx and nasopharynx clear.  NECK:   Supple, no jugular venous distention. No thyroid enlargement, no tenderness.  LUNGS: Moderate breath sounds bilaterally, no wheezing, rales,rhonchi or crepitation. No use of accessory muscles of respiration.  CARDIOVASCULAR: S1, S2 normal. No murmurs, rubs, or gallops.  ABDOMEN: Soft, nontender, nondistended. Bowel sounds present. No organomegaly or mass.  EXTREMITIES: 3+ pedal edema, no cyanosis, or clubbing.  NEUROLOGIC: Cranial nerves II through XII are intact. Muscle strength 5/5 in all extremities. Sensation intact. Gait not checked.  PSYCHIATRIC: The patient is alert and oriented x 3.  SKIN: No obvious rash, lesion, or ulcer.    LABORATORY PANEL:   CBC  Recent Labs Lab 07/31/15 0049  WBC 6.4  HGB 12.0  HCT 36.6  PLT 205   ------------------------------------------------------------------------------------------------------------------  Chemistries   Recent Labs Lab 07/31/15 0049  NA 132*  K 4.5  CL 95*  CO2 26  GLUCOSE 102*  BUN 44*  CREATININE 2.39*  CALCIUM 8.9   ------------------------------------------------------------------------------------------------------------------  Cardiac Enzymes  Recent Labs Lab 07/31/15 0049  TROPONINI 0.05*   ------------------------------------------------------------------------------------------------------------------  RADIOLOGY:  Dg Chest Port 1 View  07/31/2015  CLINICAL DATA:  Acute onset of shortness of breath. Initial encounter. EXAM: PORTABLE CHEST 1 VIEW COMPARISON:  Chest radiograph performed 07/10/2015 FINDINGS: Small bilateral pleural effusions are noted. Bibasilar airspace opacities raise concern for pulmonary edema. No pneumothorax is seen. The cardiomediastinal silhouette is enlarged. The patient is status post median sternotomy. A pacemaker/AICD is noted overlying the right chest wall, with leads ending overlying the right atrium and right ventricle. No acute osseous abnormalities are identified. IMPRESSION:  Small bilateral pleural effusions noted. Bibasilar airspace opacities raise concern for  pulmonary edema. Cardiomegaly noted. Electronically Signed   By: Garald Balding M.D.   On: 07/31/2015 01:03    EKG:   Orders placed or performed during the hospital encounter of 07/31/15  . ED EKG  . ED EKG  . EKG 12-Lead  . EKG 12-Lead  . EKG 12-Lead  . EKG 12-Lead    ASSESSMENT AND PLAN:   This is an 82 year old female admitted for acute exacerbation of systolic heart failure and hypotension. 1. CHF: Acute on chronic; systolic. NYHA stage IV. AICD in place. Last ejection fraction reportedly 15-20% area The patient received Lasix 10 mg IV in the emergency department due to caution for worsening hypotension.  Patient is started on a Lasix drip. Dopamine is discontinued and patient is started on dobutamine gtt. to support the blood pressure  2. Hypotension: Will improve with pressors. Dopamine discontinued and patient is started on dobutamine low-dose infusion The patient is stable and asymptomatic.  3. Acute on chronic kidney failure: Secondary to aggressive diuretic therapy.  Appreciate nephro to help guide our diuretic therapy.  4. Atrial fibrillation: Rate controlled but INR is subtherapeutic. warfarin consult per pharmacy Continue metoprolol and dronedarone.  5. Coronary artery disease: Stable; continue aspirin and statin therapy for secondary prevention  6. DVT prophylaxis: Full dose anticoagulation as above  7. GI prophylaxis: PPI per home regimen     All the records are reviewed and case discussed with Care Management/Social Workerr. Management plans discussed with the patient, family and they are in agreement.  CODE STATUS: FC  TOTAL CRITICAL CARE taking care of this patient 74minutes  POSSIBLE D/C IN ?DAYS, DEPENDING ON CLINICAL CONDITION.   Nicholes Mango M.D on 07/31/2015 at 2:02 PM  Between 7am to 6pm - Pager - [phone] After 6pm go to [email or web address] - password [email or web address]  [facility]  Office  [phone]  CC: Primary care physician; Kirk Ruths., MD

## 2015-07-31 NOTE — ED Provider Notes (Signed)
[facility] Emergency Department Provider Note  ____________________________________________  Time seen: 12:21 AM  I have reviewed the triage vital signs and the nursing notes.   HISTORY  Chief Complaint Shortness of Breath     HPI Sheryl Hughes is a 82 y.o. female with history of congestive heart failure chronic kidney disease atrial fibrillation presents with progressive dyspnea over the course of 1 week with acute worsening tonight inability to lay flat. Patient also admits to 4 pound weight gain over the course of the past week. Patient denies any chest pain. In addition the patient states that her INR was supratherapeutic with a value of 5 and as such she was advised to hold her Coumadin and she has done so for the past week.    Past Medical History  Diagnosis Date  . Atrial fibrillation ([place])   . CHF (congestive heart failure) ([place])   . Shingles   . CAD (coronary artery disease)   . Pulmonary hypertension ([place])   . GERD (gastroesophageal reflux disease)   . CKD (chronic kidney disease)     Patient Active Problem List   Diagnosis Date Noted  . Acute posthemorrhagic anemia   . Blood in stool   . Benign neoplasm of sigmoid colon   . Second degree hemorrhoids   . Diverticulosis of large intestine without diverticulitis   . Rectal bleeding 12/05/2014  . Warfarin-induced coagulopathy ([place]) 12/05/2014  . Hypotension 12/05/2014  . Headache 12/05/2014  . Acute respiratory failure with hypoxia ([place]) 12/05/2014  . Rectal bleed 12/05/2014  . Bilateral pleural effusion 10/21/2014  . Hyperkalemia 10/21/2014  . Renal failure (ARF), acute on chronic (HCC) 10/21/2014  . Elevated troponin 10/17/2014  . CAD (coronary artery disease) 10/17/2014  . HLD (hyperlipidemia) 10/17/2014  . A-fib ([place]) 10/17/2014  . GERD (gastroesophageal reflux disease) 10/17/2014  . Chronic kidney disease (CKD), stage IV (severe) ([place]) 10/17/2014  . Chronic systolic congestive  heart failure ([place]) 10/17/2014  . Constipation 10/17/2014    Past Surgical History  Procedure Laterality Date  . Mastectomy    . Cardiac catheterization    . Back surgery    . Eye surgery    . Thoracentesis Bilateral   . Pacemaker placement      defibrillator  . Mitral valve repair    . Coronary artery bypass graft    . Esophagogastroduodenoscopy (egd) with propofol N/A 12/08/2014    Procedure: ESOPHAGOGASTRODUODENOSCOPY (EGD) WITH PROPOFOL;  Surgeon: Lucilla Lame, MD;  Location: [facility] ENDOSCOPY;  Service: Endoscopy;  Laterality: N/A;  Look into stomach with scope.  . Colonoscopy with propofol N/A 12/08/2014    Procedure: COLONOSCOPY WITH PROPOFOL;  Surgeon: Lucilla Lame, MD;  Location: [facility] ENDOSCOPY;  Service: Endoscopy;  Laterality: N/A;  look into colon with scope    Current Outpatient Rx  Name  Route  Sig  Dispense  Refill  . aspirin EC 81 MG tablet   Oral   Take 81 mg by mouth daily.         Marland Kitchen atorvastatin (LIPITOR) 20 MG tablet   Oral   Take 20 mg by mouth at bedtime.          . Calcium Carb-Cholecalciferol (CALCIUM 600 + D PO)   Oral   Take 1 tablet by mouth daily.         . Cholecalciferol (VITAMIN D) 2000 UNITS CAPS   Oral   Take 2,000 Units by mouth daily.         Marland Kitchen docusate sodium (  COLACE) 100 MG capsule   Oral   Take 100 mg by mouth 2 (two) times daily.         Marland Kitchen dronedarone (MULTAQ) 400 MG tablet   Oral   Take 400 mg by mouth 2 (two) times daily with a meal.         . furosemide (LASIX) 40 MG tablet   Oral   Take 40 mg by mouth daily.          Marland Kitchen gabapentin (NEURONTIN) 100 MG capsule   Oral   Take 100 mg by mouth 2 (two) times daily.         . metolazone (ZAROXOLYN) 2.5 MG tablet   Oral   Take 2.5 mg by mouth every Monday, Wednesday, and Friday.         . metoprolol succinate (TOPROL-XL) 25 MG 24 hr tablet   Oral   Take 25 mg by mouth daily.          . pantoprazole (PROTONIX) 40 MG tablet   Oral   Take 40 mg by mouth daily.           . potassium chloride (K-DUR) 10 MEQ tablet   Oral   Take 30 mEq by mouth 2 (two) times daily.          Marland Kitchen spironolactone (ALDACTONE) 25 MG tablet   Oral   Take 12.5 mg by mouth daily.         . vitamin B-12 (CYANOCOBALAMIN) 1000 MCG tablet   Oral   Take 1,000 mcg by mouth daily.         Marland Kitchen warfarin (COUMADIN) 5 MG tablet   Oral   Take 5 mg by mouth daily. Take 1 tablet three times weekly and 1/2 a tablet four times weekly.         . ferrous sulfate 325 (65 FE) MG tablet   Oral   Take 1 tablet (325 mg total) by mouth 2 (two) times daily with a meal. Patient not taking: Reported on 07/31/2015   180 tablet   0   . oxyCODONE-acetaminophen (PERCOCET) 5-325 MG tablet   Oral   Take 2 tablets by mouth every 6 (six) hours as needed for moderate pain or severe pain. Patient not taking: Reported on 07/31/2015   30 tablet   0     Allergies Biaxin; Flagyl; Penicillins; and Tetracyclines & related  Family History  Problem Relation Age of Onset  . Heart attack Mother   . Heart attack Father   . COPD Father   . Ulcers Father   . Atrial fibrillation Sister   . Lung cancer Sister   . Heart attack Brother   . Aortic stenosis Brother   . CAD Brother     Social History Social History  Substance Use Topics  . Smoking status: Never Smoker   . Smokeless tobacco: None  . Alcohol Use: No    Review of Systems  Constitutional: Negative for fever. Eyes: Negative for visual changes. ENT: Negative for sore throat. Cardiovascular: Negative for chest pain. Respiratory: Positive for dyspnea for shortness of breath. Gastrointestinal: Negative for abdominal pain, vomiting and diarrhea. Genitourinary: Negative for dysuria. Musculoskeletal: Negative for back pain. Skin: Negative for rash. Neurological: Negative for headaches, focal weakness or numbness.   10-point ROS otherwise negative.  ____________________________________________   PHYSICAL EXAM:  VITAL  SIGNS: ED Triage Vitals  Enc Vitals Group     BP 07/31/15 0030 85/65 mmHg     Pulse Rate  07/31/15 0030 92     Resp 07/31/15 0030 16     Temp --      Temp src --      SpO2 07/31/15 0030 100 %     Weight --      Height --      Head Cir --      Peak Flow --      Pain Score --      Pain Loc --      Pain Edu? --      Excl. in [place]? --      Constitutional: Alert and oriented. Well appearing and in no distress. Eyes: Conjunctivae are normal. PERRL. Normal extraocular movements. ENT   Head: Normocephalic and atraumatic.   Nose: No congestion/rhinnorhea.   Mouth/Throat: Mucous membranes are moist.   Neck: No stridor. Hematological/Lymphatic/Immunilogical: No cervical lymphadenopathy. Cardiovascular: Normal rate, regular rhythm. Normal and symmetric distal pulses are present in all extremities. No murmurs, rubs, or gallops. Respiratory: Positive accessory respiratory muscle use. Breath sounds are clear and equal bilaterally. Bibasilar rales Gastrointestinal: Soft and nontender. No distention. There is no CVA tenderness. Genitourinary: deferred Musculoskeletal: Nontender with normal range of motion in all extremities. No joint effusions. 2+ bilateral lower extremity pitting edema  Neurologic:  Normal speech and language. No gross focal neurologic deficits are appreciated. Speech is normal.  Skin:  Skin is warm, dry and intact. No rash noted. Psychiatric: Mood and affect are normal. Speech and behavior are normal. Patient exhibits appropriate insight and judgment.  ____________________________________________    LABS (pertinent positives/negatives)  Labs Reviewed  BASIC METABOLIC PANEL - Abnormal; Notable for the following:    Sodium 132 (*)    Chloride 95 (*)    Glucose, Bld 102 (*)    BUN 44 (*)    Creatinine, Ser 2.39 (*)    GFR calc non Af Amer 18 (*)    GFR calc Af Amer 21 (*)    All other components within normal limits  CBC - Abnormal; Notable for the  following:    RDW 16.1 (*)    All other components within normal limits  TROPONIN I - Abnormal; Notable for the following:    Troponin I 0.05 (*)    All other components within normal limits  PROTIME-INR - Abnormal; Notable for the following:    Prothrombin Time 18.7 (*)    All other components within normal limits  BRAIN NATRIURETIC PEPTIDE - Abnormal; Notable for the following:    B Natriuretic Peptide >4500.0 (*)    All other components within normal limits  ALBUMIN - Abnormal; Notable for the following:    Albumin 3.3 (*)    All other components within normal limits  TSH - Abnormal; Notable for the following:    TSH 6.269 (*)    All other components within normal limits  MRSA PCR SCREENING  GLUCOSE, CAPILLARY  HEMOGLOBIN A1C     ____________________________________________   EKG  ED ECG REPORT I, Deering N BROWN, the attending physician, personally viewed and interpreted this ECG.   Date: 07/31/2015  EKG Time: 12:24 AM  Rate: 95  Rhythm: Atrial fibrillation with left bundle-branch block  Axis: Normal  Intervals: Irregular PR intervals  ST&T Change: None   ____________________________________________    RADIOLOGY    DG Chest Port 1 View (Final result) Result time: 07/31/15 01:03:17   Final result by Rad Results In Interface (07/31/15 01:03:17)   Narrative:   CLINICAL DATA: Acute onset of shortness of breath.  Initial encounter.  EXAM: PORTABLE CHEST 1 VIEW  COMPARISON: Chest radiograph performed 07/10/2015  FINDINGS: Small bilateral pleural effusions are noted. Bibasilar airspace opacities raise concern for pulmonary edema. No pneumothorax is seen.  The cardiomediastinal silhouette is enlarged. The patient is status post median sternotomy. A pacemaker/AICD is noted overlying the right chest wall, with leads ending overlying the right atrium and right ventricle. No acute osseous abnormalities are identified.  IMPRESSION: Small bilateral  pleural effusions noted. Bibasilar airspace opacities raise concern for pulmonary edema. Cardiomegaly noted.   Electronically Signed By: Garald Balding M.D. On: 07/31/2015 01:03            INITIAL IMPRESSION / ASSESSMENT AND PLAN / ED COURSE  Pertinent labs & imaging results that were available during my care of the patient were reviewed by me and considered in my medical decision making (see chart for details).  Given hypotension patient received 10 mg of IV Lasix. Patient discussed with Dr. Marcille Blanco for admission for further evaluation and management  ____________________________________________   FINAL CLINICAL IMPRESSION(S) / ED DIAGNOSES  Final diagnoses:  Acute on chronic diastolic CHF (congestive heart failure) ([place])      Gregor Hams, MD 07/31/15 [phone]

## 2015-07-31 NOTE — H&P (Signed)
Sheryl Hughes is an 82 y.o. female.   Chief Complaint: Shortness of breath HPI: The patient with past medical history significant for systolic heart failure status post mitral valve repair and CABG with atrial fibrillation and chronic kidney disease presents emergency department complaining of shortness of breath. The patient states she has had gradually worsening dyspnea for more than a week. She has seen her cardiologist and had multiple chest x-rays to document regression and improvement of pulmonary edema as well as recurrent pleural effusions. The patient reports that she has had multiple thoracentesis procedures last of which was in March. She has been documenting her daily weights and noted a 4 pound weight gain in 1 week. She saw her cardiologist last week who started her on spironolactone in addition to her Lasix and metolazone. Nonetheless she has not had significant urine output. In the emergency department chest x-ray showed some pulmonary edema as well as small bilateral pleural effusions. The patient had bilateral pitting edema lower extremities and required supplemental oxygen to maintain oxygen saturations greater then 94%. She was also found to be hypotensive in the 70s which prompted the emergency department staff to call the hospitalist service for management.  Past Medical History  Diagnosis Date  . Atrial fibrillation ([place])   . CHF (congestive heart failure) ([place])   . Shingles   . CAD (coronary artery disease)   . Pulmonary hypertension ([place])   . GERD (gastroesophageal reflux disease)   . CKD (chronic kidney disease)     Past Surgical History  Procedure Laterality Date  . Mastectomy    . Cardiac catheterization    . Back surgery    . Eye surgery    . Thoracentesis Bilateral   . Pacemaker placement      defibrillator  . Mitral valve repair    . Coronary artery bypass graft    . Esophagogastroduodenoscopy (egd) with propofol N/A 12/08/2014    Procedure:  ESOPHAGOGASTRODUODENOSCOPY (EGD) WITH PROPOFOL;  Surgeon: Lucilla Lame, MD;  Location: [facility] ENDOSCOPY;  Service: Endoscopy;  Laterality: N/A;  Look into stomach with scope.  . Colonoscopy with propofol N/A 12/08/2014    Procedure: COLONOSCOPY WITH PROPOFOL;  Surgeon: Lucilla Lame, MD;  Location: [facility] ENDOSCOPY;  Service: Endoscopy;  Laterality: N/A;  look into colon with scope    Family History  Problem Relation Age of Onset  . Heart attack Mother   . Heart attack Father   . COPD Father   . Ulcers Father   . Atrial fibrillation Sister   . Lung cancer Sister   . Heart attack Brother   . Aortic stenosis Brother   . CAD Brother    Social History:  reports that she has never smoked. She does not have any smokeless tobacco history on file. She reports that she does not drink alcohol or use illicit drugs.  Allergies:  Allergies  Allergen Reactions  . Biaxin [Clarithromycin] Other (See Comments)    Reaction:  Numbness of lips   . Flagyl [Metronidazole] Other (See Comments)    Reaction:  Numbness of lips   . Penicillins Rash and Other (See Comments)    Pt is unable to answer additional questions about this medication because it happened so long ago.  . Tetracyclines & Related Rash    Medications Prior to Admission  Medication Sig Dispense Refill  . aspirin EC 81 MG tablet Take 81 mg by mouth daily.    Marland Kitchen atorvastatin (LIPITOR) 20 MG tablet Take 20 mg by mouth at  bedtime.     . Calcium Carb-Cholecalciferol (CALCIUM 600 + D PO) Take 1 tablet by mouth daily.    . Cholecalciferol (VITAMIN D) 2000 UNITS CAPS Take 2,000 Units by mouth daily.    Marland Kitchen docusate sodium (COLACE) 100 MG capsule Take 100 mg by mouth 2 (two) times daily.    Marland Kitchen dronedarone (MULTAQ) 400 MG tablet Take 400 mg by mouth 2 (two) times daily with a meal.    . furosemide (LASIX) 40 MG tablet Take 40 mg by mouth daily.     Marland Kitchen gabapentin (NEURONTIN) 100 MG capsule Take 100 mg by mouth 2 (two) times daily.    . metolazone (ZAROXOLYN)  2.5 MG tablet Take 2.5 mg by mouth every Monday, Wednesday, and Friday.    . metoprolol succinate (TOPROL-XL) 25 MG 24 hr tablet Take 25 mg by mouth daily.     . pantoprazole (PROTONIX) 40 MG tablet Take 40 mg by mouth daily.     . potassium chloride (K-DUR) 10 MEQ tablet Take 30 mEq by mouth 2 (two) times daily.     Marland Kitchen spironolactone (ALDACTONE) 25 MG tablet Take 12.5 mg by mouth daily.    . vitamin B-12 (CYANOCOBALAMIN) 1000 MCG tablet Take 1,000 mcg by mouth daily.    Marland Kitchen warfarin (COUMADIN) 5 MG tablet Take 5 mg by mouth daily. Take 1 tablet three times weekly and 1/2 a tablet four times weekly.    . ferrous sulfate 325 (65 FE) MG tablet Take 1 tablet (325 mg total) by mouth 2 (two) times daily with a meal. (Patient not taking: Reported on 07/31/2015) 180 tablet 0  . oxyCODONE-acetaminophen (PERCOCET) 5-325 MG tablet Take 2 tablets by mouth every 6 (six) hours as needed for moderate pain or severe pain. (Patient not taking: Reported on 07/31/2015) 30 tablet 0    Results for orders placed or performed during the hospital encounter of 07/31/15 (from the past 48 hour(s))  Basic metabolic panel     Status: Abnormal   Collection Time: 07/31/15 12:49 AM  Result Value Ref Range   Sodium 132 (L) 135 - 145 mmol/L   Potassium 4.5 3.5 - 5.1 mmol/L   Chloride 95 (L) 101 - 111 mmol/L   CO2 26 22 - 32 mmol/L   Glucose, Bld 102 (H) 65 - 99 mg/dL   BUN 44 (H) 6 - 20 mg/dL   Creatinine, Ser 2.39 (H) 0.44 - 1.00 mg/dL   Calcium 8.9 8.9 - 10.3 mg/dL   GFR calc non Af Amer 18 (L) >60 mL/min   GFR calc Af Amer 21 (L) >60 mL/min    Comment: (NOTE) The eGFR has been calculated using the CKD EPI equation. This calculation has not been validated in all clinical situations. eGFR's persistently <60 mL/min signify possible Chronic Kidney Disease.    Anion gap 11 5 - 15  CBC     Status: Abnormal   Collection Time: 07/31/15 12:49 AM  Result Value Ref Range   WBC 6.4 3.6 - 11.0 K/uL   RBC 4.41 3.80 - 5.20 MIL/uL    Hemoglobin 12.0 12.0 - 16.0 g/dL   HCT 36.6 35.0 - 47.0 %   MCV 83.1 80.0 - 100.0 fL   MCH 27.3 26.0 - 34.0 pg   MCHC 32.9 32.0 - 36.0 g/dL   RDW 16.1 (H) 11.5 - 14.5 %   Platelets 205 150 - 440 K/uL  Troponin I     Status: Abnormal   Collection Time: 07/31/15 12:49 AM  Result  Value Ref Range   Troponin I 0.05 (H) <0.031 ng/mL    Comment: CALLED AND READ BACK BY IRIS GUIDRY AT 8341 ON 07/31/15 RWW  Protime-INR     Status: Abnormal   Collection Time: 07/31/15 12:49 AM  Result Value Ref Range   Prothrombin Time 18.7 (H) 11.4 - 15.0 seconds   INR 1.56   Brain natriuretic peptide     Status: Abnormal   Collection Time: 07/31/15 12:49 AM  Result Value Ref Range   B Natriuretic Peptide >4500.0 (H) 0.0 - 100.0 pg/mL  Albumin     Status: Abnormal   Collection Time: 07/31/15 12:49 AM  Result Value Ref Range   Albumin 3.3 (L) 3.5 - 5.0 g/dL  TSH     Status: Abnormal   Collection Time: 07/31/15 12:49 AM  Result Value Ref Range   TSH 6.269 (H) 0.350 - 4.500 uIU/mL  Glucose, capillary     Status: None   Collection Time: 07/31/15  4:23 AM  Result Value Ref Range   Glucose-Capillary 80 65 - 99 mg/dL   Dg Chest Port 1 View  07/31/2015  CLINICAL DATA:  Acute onset of shortness of breath. Initial encounter. EXAM: PORTABLE CHEST 1 VIEW COMPARISON:  Chest radiograph performed 07/10/2015 FINDINGS: Small bilateral pleural effusions are noted. Bibasilar airspace opacities raise concern for pulmonary edema. No pneumothorax is seen. The cardiomediastinal silhouette is enlarged. The patient is status post median sternotomy. A pacemaker/AICD is noted overlying the right chest wall, with leads ending overlying the right atrium and right ventricle. No acute osseous abnormalities are identified. IMPRESSION: Small bilateral pleural effusions noted. Bibasilar airspace opacities raise concern for pulmonary edema. Cardiomegaly noted. Electronically Signed   By: Garald Balding M.D.   On: 07/31/2015 01:03     Review of Systems  Constitutional: Negative for fever and chills.  HENT: Negative for sore throat and tinnitus.   Eyes: Negative for blurred vision and redness.  Respiratory: Positive for shortness of breath. Negative for cough.   Cardiovascular: Negative for chest pain, palpitations, orthopnea and PND.  Gastrointestinal: Negative for nausea, vomiting, abdominal pain and diarrhea.  Genitourinary: Negative for dysuria, urgency and frequency.  Musculoskeletal: Negative for myalgias and joint pain.  Skin: Negative for rash.       No lesions  Neurological: Negative for speech change, focal weakness and weakness.  Endo/Heme/Allergies: Does not bruise/bleed easily.       No temperature intolerance  Psychiatric/Behavioral: Negative for depression and suicidal ideas.    Blood pressure 85/59, pulse 73, temperature 97.8 F (36.6 C), temperature source Oral, resp. rate 23, height _0  (1.626 m), weight 47.3 kg (104 lb 4.4 oz), SpO2 98 %. Physical Exam  Vitals reviewed. Constitutional: She is oriented to person, place, and time. She appears well-developed and well-nourished. No distress.  HENT:  Head: Normocephalic and atraumatic.  Mouth/Throat: Oropharynx is clear and moist.  Eyes: Conjunctivae and EOM are normal. Pupils are equal, round, and reactive to light. No scleral icterus.  Neck: Normal range of motion. Neck supple. No JVD present. No tracheal deviation present. No thyromegaly present.  Cardiovascular: Normal rate, regular rhythm and normal heart sounds.  Exam reveals no gallop and no friction rub.   No murmur heard. Respiratory: Effort normal and breath sounds normal.  Status post left mastectomy; AICD in place right chest  GI: Soft. Bowel sounds are normal. She exhibits no distension. There is no tenderness.  Genitourinary:  Deferred  Musculoskeletal: Normal range of motion. She exhibits edema (2+  bilateral lower extremities).  Lymphadenopathy:    She has no cervical  adenopathy.  Neurological: She is alert and oriented to person, place, and time. No cranial nerve deficit. She exhibits normal muscle tone.  Skin: Skin is warm and dry. No rash noted. No erythema.  Psychiatric: She has a normal mood and affect. Her behavior is normal. Judgment and thought content normal.     Assessment/Plan This is an 82 year old female admitted for acute exacerbation of systolic heart failure and hypotension. 1. CHF: Acute on chronic; systolic. NYHA stage IV. AICD in place. Last ejection fraction reportedly 15-20% area The patient received Lasix 10 mg IV in the emergency department due to caution for worsening hypotension. This dose is inadequate and she has not produced any urine output. I will admit the patient to the ICU to begin a dopamine drip. She may continue her routine dose of metolazone and have ordered Lasix 80 mg IV based on her renal function shortly thereafter. Hopefully we can improve the perfusion to her kidneys and achieve significant diuresis. I have added Glennon Mac for symptomatic relief as well as possible improved contractility. 2. Hypotension: Will improve with pressors. The patient is stable and asymptomatic. 3. Acute on chronic kidney failure: Secondary to aggressive diuretic therapy. The patient is intravascularly dry. We will dose the patient's Lasix as needed. I have held her oral dose of Lasix but we will continue spironolactone and metolazone. I have ordered a nephrology consult to help guide our diuretic therapy. 4. Atrial fibrillation: Rate controlled but INR is subtherapeutic. I have ordered a warfarin consult from pharmacy. Continue metoprolol and dronedarone. 5. Coronary artery disease: Stable; continue aspirin and statin therapy for secondary prevention 6. DVT prophylaxis: Full dose anticoagulation as above 7. GI prophylaxis: PPI per home regimen The patient is a full code. Time spent on admission orders and critical care approximately 45  minutes  Harrie Foreman, MD 07/31/2015, 5:28 AM

## 2015-07-31 NOTE — ED Notes (Signed)
MD Brown at bedside.

## 2015-07-31 NOTE — Consult Note (Signed)
Sheryl Hughes  CARDIOLOGY CONSULT NOTE  Patient ID: Sheryl Hughes MRN: [id number] DOB/AGE: 1932-01-01 82 y.o.  Admit date: 07/31/2015 Referring Physician Dr. Margaretmary Eddy Primary Physician Dr. Ouida Sills Primary Cardiologist Dr. Saralyn Pilar Reason for Consultation CHF  HPI: Patient is a 82 year old female with history of coronary artery disease with a left internal mammary to the LAD done in 2013, history of mitral valve repair with a maze procedure and dual-chamber pacemaker placement in 2013, history of chronic congestive heart failure with unknown ejection fraction of 20% with moderate TR and aortic insufficiency, status post AICD placement 07/05/2012, history of chronic atrial fibrillation, history of recurrent bilateral pleural effusions with thoracenteses done on several occasions who was admitted with progressive shortness of breath and 4 pound weight gain. Chest x-ray in the emergency room revealed small bilateral pleural effusions with bibasilar airspace opacities consistent with possible pulmonary edema. She was noted to have acute on chronic renal insufficiency with a serum creatinine of 2.39 with a GFR of 18 mL/m. 7 months ago her serum creatinine was 1.2 with a GFR of 42 mL/m. She was noted to be relatively hypotensive and was placed on a dopamine drip which was complicated by rapid ventricular response. This was discontinued and she was placed on a Lasix drip as well as dobutamine. She continues to be in atrial fibrillation with a fairly rapid ventricular response in the low 100s. She takes metolazone 3 times a week as an outpatient. She is anticoagulated with warfarin with a chads*score of 6. She was also recently placed on spironolactone at 12.5 mg daily. She is ruled out for myocardial infarction. She is on multifacted 40 mg twice daily along with metoprolol for rate control.  Review of Systems  Constitutional: Positive for malaise/fatigue.  HENT: Negative.    Eyes: Negative.   Respiratory: Positive for shortness of breath.   Cardiovascular: Positive for palpitations and leg swelling.  Gastrointestinal: Negative.   Genitourinary: Negative.   Musculoskeletal: Negative.   Skin: Negative.   Neurological: Positive for dizziness and weakness.  Endo/Heme/Allergies: Negative.   Psychiatric/Behavioral: Negative.     Past Medical History  Diagnosis Date  . Atrial fibrillation ([place])   . CHF (congestive heart failure) ([place])   . Shingles   . CAD (coronary artery disease)   . Pulmonary hypertension ([place])   . GERD (gastroesophageal reflux disease)   . CKD (chronic kidney disease)     Family History  Problem Relation Age of Onset  . Heart attack Mother   . Heart attack Father   . COPD Father   . Ulcers Father   . Atrial fibrillation Sister   . Lung cancer Sister   . Heart attack Brother   . Aortic stenosis Brother   . CAD Brother     Social History   Social History  . Marital Status: Married    Spouse Name: N/A  . Number of Children: N/A  . Years of Education: N/A   Occupational History  . Not on file.   Social History Main Topics  . Smoking status: Never Smoker   . Smokeless tobacco: Not on file  . Alcohol Use: No  . Drug Use: No  . Sexual Activity: Not on file   Other Topics Concern  . Not on file   Social History Narrative    Past Surgical History  Procedure Laterality Date  . Mastectomy    . Cardiac catheterization    . Back surgery    . Eye  surgery    . Thoracentesis Bilateral   . Pacemaker placement      defibrillator  . Mitral valve repair    . Coronary artery bypass graft    . Esophagogastroduodenoscopy (egd) with propofol N/A 12/08/2014    Procedure: ESOPHAGOGASTRODUODENOSCOPY (EGD) WITH PROPOFOL;  Surgeon: Lucilla Lame, MD;  Location: [facility] ENDOSCOPY;  Service: Endoscopy;  Laterality: N/A;  Look into stomach with scope.  . Colonoscopy with propofol N/A 12/08/2014    Procedure: COLONOSCOPY WITH PROPOFOL;   Surgeon: Lucilla Lame, MD;  Location: [facility] ENDOSCOPY;  Service: Endoscopy;  Laterality: N/A;  look into colon with scope     Prescriptions prior to admission  Medication Sig Dispense Refill Last Dose  . aspirin EC 81 MG tablet Take 81 mg by mouth daily.   07/30/2015 at Unknown time  . atorvastatin (LIPITOR) 20 MG tablet Take 20 mg by mouth at bedtime.    07/30/2015 at Unknown time  . Calcium Carb-Cholecalciferol (CALCIUM 600 + D PO) Take 1 tablet by mouth daily.   07/30/2015 at Unknown time  . Cholecalciferol (VITAMIN D) 2000 UNITS CAPS Take 2,000 Units by mouth daily.   07/30/2015 at Unknown time  . docusate sodium (COLACE) 100 MG capsule Take 100 mg by mouth 2 (two) times daily.   07/30/2015 at Unknown time  . dronedarone (MULTAQ) 400 MG tablet Take 400 mg by mouth 2 (two) times daily with a meal.   07/30/2015 at Unknown time  . furosemide (LASIX) 40 MG tablet Take 40 mg by mouth daily.    07/30/2015 at Unknown time  . gabapentin (NEURONTIN) 100 MG capsule Take 100 mg by mouth 2 (two) times daily.   07/30/2015 at Unknown time  . metolazone (ZAROXOLYN) 2.5 MG tablet Take 2.5 mg by mouth every Monday, Wednesday, and Friday.   07/30/2015 at Unknown time  . metoprolol succinate (TOPROL-XL) 25 MG 24 hr tablet Take 25 mg by mouth daily.    07/30/2015 at Unknown time  . pantoprazole (PROTONIX) 40 MG tablet Take 40 mg by mouth daily.    07/30/2015 at Unknown time  . potassium chloride (K-DUR) 10 MEQ tablet Take 30 mEq by mouth 2 (two) times daily.    07/30/2015 at Unknown time  . spironolactone (ALDACTONE) 25 MG tablet Take 12.5 mg by mouth daily.   07/30/2015 at Unknown time  . vitamin B-12 (CYANOCOBALAMIN) 1000 MCG tablet Take 1,000 mcg by mouth daily.   07/30/2015 at Unknown time  . warfarin (COUMADIN) 5 MG tablet Take 5 mg by mouth daily. Take 1 tablet three times weekly and 1/2 a tablet four times weekly.   Past Month at Unknown time  . ferrous sulfate 325 (65 FE) MG tablet Take 1 tablet (325 mg total) by mouth  2 (two) times daily with a meal. (Patient not taking: Reported on 07/31/2015) 180 tablet 0 Not Taking at Unknown time  . oxyCODONE-acetaminophen (PERCOCET) 5-325 MG tablet Take 2 tablets by mouth every 6 (six) hours as needed for moderate pain or severe pain. (Patient not taking: Reported on 07/31/2015) 30 tablet 0 Not Taking at Unknown time    Physical Exam: Blood pressure 86/54, pulse 104, temperature 97.8 F (36.6 C), temperature source Axillary, resp. rate 26, height 5\' 4"  (1.626 m), weight 47.3 kg (104 lb 4.4 oz), SpO2 100 %.   Wt Readings from Last 1 Encounters:  07/31/15 47.3 kg (104 lb 4.4 oz)     General appearance: alert and cooperative Head: Normocephalic, without obvious abnormality, atraumatic Resp: rales  bibasilar Cardio: irregularly irregular rhythm GI: soft, non-tender; bowel sounds normal; no masses,  no organomegaly Extremities: edema 4+ le edema Pulses: 2+ and symmetric Neurologic: Grossly normal  Labs:   Lab Results  Component Value Date   WBC 6.4 07/31/2015   HGB 12.0 07/31/2015   HCT 36.6 07/31/2015   MCV 83.1 07/31/2015   PLT 205 07/31/2015    Recent Labs Lab 07/31/15 0049  NA 132*  K 4.5  CL 95*  CO2 26  BUN 44*  CREATININE 2.39*  CALCIUM 8.9  GLUCOSE 102*   Lab Results  Component Value Date   TROPONINI 0.05* 07/31/2015      Radiology: Cardiomegaly with small bilateral pleural effusions and probable pulmonary edema EKG: Atrial fibrillation with rapid ventricular response  ASSESSMENT AND PLAN:  82 year old female with history of multiple medical problems including cardio myopathy and ejection fraction of 20% with moderate TR and AI, history of acute on chronic renal insufficiency, history of AICD placement status post Maze procedure, history of mitral valve repair, admitted with progressive shortness of breath. She was noted to have mild pulmonary edema. She also has a rise in her serum creatinine with reduced function. She is currently on  low-dose dobutamine drip along with Lasix drip. Heart rate and blood pressure of stabilized with an M AP greater than 60. We'll need to carefully follow electrolytes, hemodynamics and heart rate. Would continue with Lasix drip for now after discussion with nephrology. We'll continue with dobutamine as tolerated weaning as tolerated which will help heart rate. Signed: Teodoro Spray MD, [facility] 07/31/2015, 11:03 AM

## 2015-08-01 LAB — BASIC METABOLIC PANEL
ANION GAP: 7 (ref 5–15)
BUN: 38 mg/dL — ABNORMAL HIGH (ref 6–20)
CHLORIDE: 95 mmol/L — AB (ref 101–111)
CO2: 33 mmol/L — AB (ref 22–32)
CREATININE: 2.04 mg/dL — AB (ref 0.44–1.00)
Calcium: 8.5 mg/dL — ABNORMAL LOW (ref 8.9–10.3)
GFR calc non Af Amer: 22 mL/min — ABNORMAL LOW (ref >60)
GFR, EST AFRICAN AMERICAN: 25 mL/min — AB (ref >60)
GLUCOSE: 91 mg/dL (ref 65–99)
Potassium: 3.6 mmol/L (ref 3.5–5.1)
Sodium: 135 mmol/L (ref 135–145)

## 2015-08-01 LAB — CBC WITH DIFFERENTIAL/PLATELET
BASOS PCT: 0 %
Basophils Absolute: 0 10*3/uL (ref 0–0.1)
Eosinophils Absolute: 0.1 10*3/uL (ref 0–0.7)
Eosinophils Relative: 1 %
HEMATOCRIT: 33.4 % — AB (ref 35.0–47.0)
HEMOGLOBIN: 10.9 g/dL — AB (ref 12.0–16.0)
LYMPHS PCT: 11 %
Lymphs Abs: 0.6 10*3/uL — ABNORMAL LOW (ref 1.0–3.6)
MCH: 27.2 pg (ref 26.0–34.0)
MCHC: 32.7 g/dL (ref 32.0–36.0)
MCV: 83.2 fL (ref 80.0–100.0)
MONOS PCT: 12 %
Monocytes Absolute: 0.7 10*3/uL (ref 0.2–0.9)
NEUTROS ABS: 4.2 10*3/uL (ref 1.4–6.5)
NEUTROS PCT: 76 %
Platelets: 168 10*3/uL (ref 150–440)
RBC: 4.02 MIL/uL (ref 3.80–5.20)
RDW: 15.9 % — ABNORMAL HIGH (ref 11.5–14.5)
WBC: 5.6 10*3/uL (ref 3.6–11.0)

## 2015-08-01 LAB — T4, FREE: Free T4: 1.52 ng/dL — ABNORMAL HIGH (ref 0.61–1.12)

## 2015-08-01 LAB — PROTIME-INR
INR: 1.67
Prothrombin Time: 19.7 seconds — ABNORMAL HIGH (ref 11.4–15.0)

## 2015-08-01 LAB — MAGNESIUM: Magnesium: 1.9 mg/dL (ref 1.7–2.4)

## 2015-08-01 MED ORDER — MAGNESIUM SULFATE IN D5W 1-5 GM/100ML-% IV SOLN
1.0000 g | Freq: Once | INTRAVENOUS | Status: AC
  Administered 2015-08-01: 1 g via INTRAVENOUS
  Filled 2015-08-01: qty 100

## 2015-08-01 MED ORDER — POLYETHYLENE GLYCOL 3350 17 G PO PACK
17.0000 g | PACK | Freq: Every day | ORAL | Status: DC
  Administered 2015-08-01 – 2015-08-05 (×5): 17 g via ORAL
  Filled 2015-08-01 (×5): qty 1

## 2015-08-01 MED ORDER — FUROSEMIDE 10 MG/ML IJ SOLN
60.0000 mg | Freq: Every day | INTRAMUSCULAR | Status: DC
Start: 2015-08-01 — End: 2015-08-04
  Administered 2015-08-01 – 2015-08-04 (×4): 60 mg via INTRAVENOUS
  Filled 2015-08-01 (×5): qty 6

## 2015-08-01 NOTE — Progress Notes (Signed)
ANTICOAGULATION CONSULT NOTE - Initial Consult  Pharmacy Consult for warfarin Indication: atrial fibrillation  Allergies  Allergen Reactions  . Biaxin [Clarithromycin] Other (See Comments)    Reaction:  Numbness of lips   . Flagyl [Metronidazole] Other (See Comments)    Reaction:  Numbness of lips   . Penicillins Rash and Other (See Comments)    Pt is unable to answer additional questions about this medication because it happened so long ago.  . Tetracyclines & Related Rash    Patient Measurements: Height: 5\' 4"  (162.6 cm) Weight: 104 lb 4.4 oz (47.3 kg) IBW/kg (Calculated) : 54.7  Vital Signs: Temp: 98.4 F (36.9 C) (05/25 1200) Temp Source: Oral (05/25 1200) BP: 85/64 mmHg (05/25 1500) Pulse Rate: 56 (05/25 1500)  Labs:  Recent Labs  07/31/15 0049 07/31/15 1432 08/01/15 0322  HGB 12.0  --  10.9*  HCT 36.6  --  33.4*  PLT 205  --  168  LABPROT 18.7*  --  19.7*  INR 1.56  --  1.67  CREATININE 2.39* 2.00* 2.04*  TROPONINI 0.05*  --   --     Estimated Creatinine Clearance: 16.1 mL/min (by C-G formula based on Cr of 2.04).   Medical History: Past Medical History  Diagnosis Date  . Atrial fibrillation ([place])   . CHF (congestive heart failure) ([place])   . Shingles   . CAD (coronary artery disease)   . Pulmonary hypertension ([place])   . GERD (gastroesophageal reflux disease)   . CKD (chronic kidney disease)     Assessment:  Pharmacy consulted for warfarin dosing for 82 yo female on warfarin as an outpatient for atrial fibrillation, goal INR 2-3. PTA lists warfarin 5 mg po three times a week and 2.5 mg po four times a week for 25 mg weekly.   Goal of Therapy:  INR 2-3 Monitor platelets by anticoagulation protocol: Yes   Plan:  Will continue patient on warfarin 3mg  q1800. Will obtain INR with am labs.   Pharmacy will continue to monitor and adjust per consult.    Shandie Bertz L 08/01/2015,4:27 PM

## 2015-08-01 NOTE — Progress Notes (Signed)
RN spoke with Dr. Ubaldo Glassing on the phone and made MD aware that patient has had several runs of Vtach 4,5 and 6 beat runs. K+3.6, Mag 1.9 and patient has had 1g of Mag today.  Dr. Ubaldo Glassing gave order to give another 1g dose of IV mag.

## 2015-08-01 NOTE — Progress Notes (Signed)
[place]  SUBJECTIVE: No chest pain . Denies sob   Filed Vitals:   08/01/15 0300 08/01/15 0400 08/01/15 0500 08/01/15 0600  BP: 88/56 88/60 99/60  95/55  Pulse: 76 76 95 83  Temp:      TempSrc:      Resp: 20 17 18 17   Height:      Weight:      SpO2: 98% 98% 98% 98%    Intake/Output Summary (Last 24 hours) at 08/01/15 0831 Last data filed at 08/01/15 0716  Gross per 24 hour  Intake  14.13 ml  Output   2650 ml  Net -2635.87 ml    LABS: Basic Metabolic Panel:  Recent Labs  07/31/15 1432 08/01/15 0322  NA 134* 135  K 4.1 3.6  CL 96* 95*  CO2 29 33*  GLUCOSE 127* 91  BUN 40* 38*  CREATININE 2.00* 2.04*  CALCIUM 8.7* 8.5*  MG  --  1.9   Liver Function Tests:  Recent Labs  07/31/15 0049 07/31/15 1432  AST  --  26  ALT  --  14  ALKPHOS  --  84  BILITOT  --  1.2  PROT  --  6.0*  ALBUMIN 3.3* 2.9*   No results for input(s): LIPASE, AMYLASE in the last 72 hours. CBC:  Recent Labs  07/31/15 0049 08/01/15 0322  WBC 6.4 5.6  NEUTROABS  --  4.2  HGB 12.0 10.9*  HCT 36.6 33.4*  MCV 83.1 83.2  PLT 205 168   Cardiac Enzymes:  Recent Labs  07/31/15 0049  TROPONINI 0.05*   BNP: Invalid input(s): POCBNP D-Dimer: No results for input(s): DDIMER in the last 72 hours. Hemoglobin A1C:  Recent Labs  07/31/15 0049  HGBA1C 6.7*   Fasting Lipid Panel: No results for input(s): CHOL, HDL, LDLCALC, TRIG, CHOLHDL, LDLDIRECT in the last 72 hours. Thyroid Function Tests:  Recent Labs  07/31/15 0049  TSH 6.269*   Anemia Panel: No results for input(s): VITAMINB12, FOLATE, FERRITIN, TIBC, IRON, RETICCTPCT in the last 72 hours.   Physical Exam: Blood pressure 95/55, pulse 83, temperature 97.9 F (36.6 C), temperature source Oral, resp. rate 17, height 5\' 4"  (1.626 m), weight 47.3 kg (104 lb 4.4 oz), SpO2 98 %.   Wt Readings from Last 1 Encounters:  07/31/15 47.3 kg (104 lb 4.4 oz)     General appearance:  cooperative Head: Normocephalic, without obvious abnormality, atraumatic Resp: clear to auscultation bilaterally Cardio: irregularly irregular rhythm Extremities: extremities normal, atraumatic, no cyanosis or edema Neurologic: Grossly normal  TELEMETRY: Reviewed telemetry pt in afib with variable vr:  ASSESSMENT AND PLAN:  Active Problems:   Acute exacerbation of CHF (congestive heart failure) (HCC)--2.6 liters last 24 hours. Creatinine mildly iimproved to 2.00 down from 2.04. Feels a little better. Relatively hypotensive but stable at present. Will continue careful diuresis avoiding pressors to keep MAP greater than 60  afib-rate improve somewhat  Will continue with warfarin for anticoagulation      Teodoro Spray., MD, [facility] 08/01/2015 8:31 AM

## 2015-08-01 NOTE — Progress Notes (Signed)
[place] [facility] [place] NAME: Sheryl Hughes    MR#:  [id number]  DATE OF BIRTH:  1931/04/27  SUBJECTIVE:  CHIEF COMPLAINT:  Patient is feeling much better . SOB and LE swelling improved  Daughter at bedside  REVIEW OF SYSTEMS:  CONSTITUTIONAL: No fever, fatigue or weakness.  EYES: No blurred or double vision.  EARS, NOSE, AND THROAT: No tinnitus or ear pain.  RESPIRATORY: No cough,Reporting exertional shortness of breath, denies any wheezing or hemoptysis.  CARDIOVASCULAR: No chest pain, orthopnea, edema.  GASTROINTESTINAL: No nausea, vomiting, diarrhea or abdominal pain.  GENITOURINARY: No dysuria, hematuria.  ENDOCRINE: No polyuria, nocturia,  HEMATOLOGY: No anemia, easy bruising or bleeding SKIN: No rash or lesion.leg swelling is better MUSCULOSKELETAL: No joint pain or arthritis.   NEUROLOGIC: No tingling, numbness, weakness.  PSYCHIATRY: No anxiety or depression.   DRUG ALLERGIES:   Allergies  Allergen Reactions  . Biaxin [Clarithromycin] Other (See Comments)    Reaction:  Numbness of lips   . Flagyl [Metronidazole] Other (See Comments)    Reaction:  Numbness of lips   . Penicillins Rash and Other (See Comments)    Pt is unable to answer additional questions about this medication because it happened so long ago.  . Tetracyclines & Related Rash    VITALS:  Blood pressure 98/79, pulse 107, temperature 98.2 F (36.8 C), temperature source Oral, resp. rate 15, height 5\' 4"  (1.626 m), weight 47.3 kg (104 lb 4.4 oz), SpO2 97 %.  PHYSICAL EXAMINATION:  GENERAL:  82 y.o.-year-old patient lying in the bed with no acute distress.  EYES: Pupils equal, round, reactive to light and accommodation. No scleral icterus. Extraocular muscles intact.  HEENT: Head atraumatic, normocephalic. Oropharynx and nasopharynx clear.  NECK:  Supple, no jugular venous distention. No thyroid enlargement, no tenderness.  LUNGS: Moderate breath sounds  bilaterally, no wheezing, rales,rhonchi or crepitation. No use of accessory muscles of respiration.  CARDIOVASCULAR: S1, S2 normal. No murmurs, rubs, or gallops.  ABDOMEN: Soft, nontender, nondistended. Bowel sounds present. No organomegaly or mass.  EXTREMITIES: 1+ pedal edema, no cyanosis, or clubbing.  NEUROLOGIC: Cranial nerves II through XII are intact. Muscle strength 5/5 in all extremities. Sensation intact. Gait not checked.  PSYCHIATRIC: The patient is alert and oriented x 3.  SKIN: No obvious rash, lesion, or ulcer.    LABORATORY PANEL:   CBC  Recent Labs Lab 08/01/15 0322  WBC 5.6  HGB 10.9*  HCT 33.4*  PLT 168   ------------------------------------------------------------------------------------------------------------------  Chemistries   Recent Labs Lab 07/31/15 1432 08/01/15 0322  NA 134* 135  K 4.1 3.6  CL 96* 95*  CO2 29 33*  GLUCOSE 127* 91  BUN 40* 38*  CREATININE 2.00* 2.04*  CALCIUM 8.7* 8.5*  MG  --  1.9  AST 26  --   ALT 14  --   ALKPHOS 84  --   BILITOT 1.2  --    ------------------------------------------------------------------------------------------------------------------  Cardiac Enzymes  Recent Labs Lab 07/31/15 0049  TROPONINI 0.05*   ------------------------------------------------------------------------------------------------------------------  RADIOLOGY:  Dg Chest Port 1 View  07/31/2015  CLINICAL DATA:  PICC placement EXAM: PORTABLE CHEST 1 VIEW COMPARISON:  Earlier today FINDINGS: New central line into the right IJ with tip likely at the level of the upper cavoatrial junction (limited by overlapping dual-chamber pacer leads which showed no interval placement). Low volume chest with pleural effusions and obscured lower lobes. Chronic cardiomegaly. Status post median sternotomy with mitral valve annuloplasty. IMPRESSION:  1. No acute finding related to central line placement. Tip at the upper cavoatrial junction. 2. Low  volume chest with bilateral pleural effusion obscuring the lower lobes. Electronically Signed   By: Monte Fantasia M.D.   On: 07/31/2015 14:34   Dg Chest Port 1 View  07/31/2015  CLINICAL DATA:  Acute onset of shortness of breath. Initial encounter. EXAM: PORTABLE CHEST 1 VIEW COMPARISON:  Chest radiograph performed 07/10/2015 FINDINGS: Small bilateral pleural effusions are noted. Bibasilar airspace opacities raise concern for pulmonary edema. No pneumothorax is seen. The cardiomediastinal silhouette is enlarged. The patient is status post median sternotomy. A pacemaker/AICD is noted overlying the right chest wall, with leads ending overlying the right atrium and right ventricle. No acute osseous abnormalities are identified. IMPRESSION: Small bilateral pleural effusions noted. Bibasilar airspace opacities raise concern for pulmonary edema. Cardiomegaly noted. Electronically Signed   By: Garald Balding M.D.   On: 07/31/2015 01:03    EKG:   Orders placed or performed during the hospital encounter of 07/31/15  . ED EKG  . ED EKG  . EKG 12-Lead  . EKG 12-Lead  . EKG 12-Lead  . EKG 12-Lead    ASSESSMENT AND PLAN:   This is an 82 year old female admitted for acute exacerbation of systolic heart failure and hypotension. 1. CHF: Acute on chronic; systolic. NYHA stage IV. AICD in place. Last ejection fraction reportedly 15-20% area The patient received Lasix 10 mg IV in the emergency department due to caution for worsening hypotension.  Patient is off Lasix [employer].it out put 2.5 lit  Dopamine is discontinued and patient is started on dobutamine gtt prn to support the blood pressure , goal MAP >60 Lasix 60 iv q daily Appreciate cardio and nephro recs  2. Hypotension: MAP at 65  Dopamine discontinued and patient is started on dobutamine low-dose infusion The patient is stable and asymptomatic.  3. Acute on chronic kidney failure: Secondary to aggressive diuretic therapy.  Appreciate nephro to help  guide our diuretic therapy.  4. Atrial fibrillation: Rate controlled but INR is subtherapeutic. warfarin consult per pharmacy Continue metoprolol and dronedarone.  5. Coronary artery disease: Stable; continue aspirin and statin therapy for secondary prevention  6. DVT prophylaxis: Full dose anticoagulation as above  7. GI prophylaxis: PPI per home regimen     All the records are reviewed and case discussed with Care Management/Social Workerr. Management plans discussed with the patient, family and they are in agreement.  CODE STATUS: FC  TOTAL CRITICAL CARE taking care of this patient 43minutes  POSSIBLE D/C IN ?DAYS, DEPENDING ON CLINICAL CONDITION.   Nicholes Mango M.D on 08/01/2015 at 9:41 AM  Between 7am to 6pm - Pager - [phone] After 6pm go to [email or web address] - password [email or web address] [facility]  Office  [phone]  CC: Primary care physician; Kirk Ruths., MD

## 2015-08-01 NOTE — Progress Notes (Signed)
[facility]  ROUNDING NOTE   Subjective:  Patient status has improved significantly. Good urine output noted. Much less lower extremity edema.  Objective:  Vital signs in last 24 hours:  Temp:  [97.6 F (36.4 C)-98.2 F (36.8 C)] 98.2 F (36.8 C) (05/25 0836) Pulse Rate:  [58-106] 71 (05/25 0800) Resp:  [12-31] 13 (05/25 0800) BP: (82-104)/(54-68) 104/60 mmHg (05/25 0800) SpO2:  [92 %-100 %] 97 % (05/25 0800)  Weight change:  Filed Weights   07/31/15 0423  Weight: 47.3 kg (104 lb 4.4 oz)    Intake/Output: I/O last 3 completed shifts: In: 79.4 [I.V.:79.4] Out: 2935 [Urine:2935]   Intake/Output this shift:  Total I/O In: 12 [I.V.:12] Out: 625 [Urine:625]  Physical Exam: General: NAD, sitting up in bed  Head: Normocephalic, atraumatic. Moist oral mucosal membranes  Eyes: Anicteric  Neck: Supple, trachea midline  Lungs:  Minimal basilar rales  Heart: S1S2 irregular no rubs  Abdomen:  Soft, nontender, BS present  Extremities: trace peripheral edema.  Neurologic: Nonfocal, moving all four extremities  Skin: No lesions       Basic Metabolic Panel:  Recent Labs Lab 07/31/15 0049 07/31/15 1432 08/01/15 0322  NA 132* 134* 135  K 4.5 4.1 3.6  CL 95* 96* 95*  CO2 26 29 33*  GLUCOSE 102* 127* 91  BUN 44* 40* 38*  CREATININE 2.39* 2.00* 2.04*  CALCIUM 8.9 8.7* 8.5*  MG  --   --  1.9    Liver Function Tests:  Recent Labs Lab 07/31/15 0049 07/31/15 1432  AST  --  26  ALT  --  14  ALKPHOS  --  84  BILITOT  --  1.2  PROT  --  6.0*  ALBUMIN 3.3* 2.9*   No results for input(s): LIPASE, AMYLASE in the last 168 hours. No results for input(s): AMMONIA in the last 168 hours.  CBC:  Recent Labs Lab 07/31/15 0049 08/01/15 0322  WBC 6.4 5.6  NEUTROABS  --  4.2  HGB 12.0 10.9*  HCT 36.6 33.4*  MCV 83.1 83.2  PLT 205 168    Cardiac Enzymes:  Recent Labs Lab 07/31/15 0049  TROPONINI 0.05*    BNP: Invalid input(s):  POCBNP  CBG:  Recent Labs Lab 07/31/15 0423  GLUCAP 5    Microbiology: Results for orders placed or performed during the hospital encounter of 07/31/15  MRSA PCR Screening     Status: None   Collection Time: 07/31/15  4:30 AM  Result Value Ref Range Status   MRSA by PCR NEGATIVE NEGATIVE Final    Comment:        The GeneXpert MRSA Assay (FDA approved for NASAL specimens only), is one component of a comprehensive MRSA colonization surveillance program. It is not intended to diagnose MRSA infection nor to guide or monitor treatment for MRSA infections.     Coagulation Studies:  Recent Labs  07/31/15 0049 08/01/15 0322  LABPROT 18.7* 19.7*  INR 1.56 1.67    Urinalysis: No results for input(s): COLORURINE, LABSPEC, PHURINE, GLUCOSEU, HGBUR, BILIRUBINUR, KETONESUR, PROTEINUR, UROBILINOGEN, NITRITE, LEUKOCYTESUR in the last 72 hours.  Invalid input(s): APPERANCEUR    Imaging: Dg Chest Port 1 View  07/31/2015  CLINICAL DATA:  PICC placement EXAM: PORTABLE CHEST 1 VIEW COMPARISON:  Earlier today FINDINGS: New central line into the right IJ with tip likely at the level of the upper cavoatrial junction (limited by overlapping dual-chamber pacer leads which showed no interval placement). Low volume chest with pleural effusions  and obscured lower lobes. Chronic cardiomegaly. Status post median sternotomy with mitral valve annuloplasty. IMPRESSION: 1. No acute finding related to central line placement. Tip at the upper cavoatrial junction. 2. Low volume chest with bilateral pleural effusion obscuring the lower lobes. Electronically Signed   By: Monte Fantasia M.D.   On: 07/31/2015 14:34   Dg Chest Port 1 View  07/31/2015  CLINICAL DATA:  Acute onset of shortness of breath. Initial encounter. EXAM: PORTABLE CHEST 1 VIEW COMPARISON:  Chest radiograph performed 07/10/2015 FINDINGS: Small bilateral pleural effusions are noted. Bibasilar airspace opacities raise concern for  pulmonary edema. No pneumothorax is seen. The cardiomediastinal silhouette is enlarged. The patient is status post median sternotomy. A pacemaker/AICD is noted overlying the right chest wall, with leads ending overlying the right atrium and right ventricle. No acute osseous abnormalities are identified. IMPRESSION: Small bilateral pleural effusions noted. Bibasilar airspace opacities raise concern for pulmonary edema. Cardiomegaly noted. Electronically Signed   By: Garald Balding M.D.   On: 07/31/2015 01:03     Medications:   . DOPamine Stopped (07/31/15 0745)  . furosemide (LASIX) infusion 4 mg/hr (07/31/15 1453)   . antiseptic oral rinse  7 mL Mouth Rinse BID  . aspirin EC  81 mg Oral Daily  . atorvastatin  20 mg Oral QHS  . calcium-vitamin D  1 tablet Oral Daily  . cholecalciferol  2,000 Units Oral Daily  . digoxin  0.125 mg Oral Daily  . docusate sodium  100 mg Oral BID  . dronedarone  400 mg Oral BID WC  . gabapentin  100 mg Oral BID  . magnesium sulfate 1 - 4 g bolus IVPB  1 g Intravenous Once  . metoprolol succinate  25 mg Oral Daily  . pantoprazole  40 mg Oral QAC breakfast  . polyethylene glycol  17 g Oral Daily  . potassium chloride  30 mEq Oral BID  . sodium chloride flush  3 mL Intravenous Q12H  . spironolactone  12.5 mg Oral Daily  . vitamin B-12  1,000 mcg Oral Daily  . warfarin  3 mg Oral q1800  . Warfarin - Pharmacist Dosing Inpatient   Does not apply q1800   acetaminophen **OR** acetaminophen, morphine injection, ondansetron **OR** ondansetron (ZOFRAN) IV  Assessment/ Plan:  82 y.o. female with a PMHx of chronic systolic heart faiulre EF 15%, atrial fibrillation, coronary artery disease, CKD stage III baseline Cr 1.03 with egfr of 12, pulmonary hypertension, coronary artery disease s/p CABG, history of pacemaker placement who presents now with shortness of breath due to heart failure exacerbation.  1.  Acute renal failure/chronic and disease stage III Baseline  creatinine 1.03 with EGFR 56. It appears that the patient has had another episode of acute renal failure likely secondary to altered cardiorenal hemodynamics. She has had weight gain and increasing lower extremity edema. - Patient has improved significantly. However renal function about the same as before. Discontinue Lasix drip. We will administer Lasix 60 mg IV 1 today.  2. Acute systolic heart failure exacerbation. Improved significantly. Good urine output noted. DC Lasix drip in favor of intravenous Lasix as above.  3. Hypotension. Likely related to acute systolic heart failure. Hopefully norepinephrine can be weaned down today.     LOS: 1 Farzana Koci 5/25/20179:08 AM

## 2015-08-01 NOTE — Progress Notes (Signed)
RN spoke with Dr. Holley Raring about patient having leg cramps and mag is 1.9.  Patient also asking for miralax.  MD gave order for 1g mag IV infusion and miralax daily.

## 2015-08-02 LAB — DIGOXIN LEVEL: Digoxin Level: 1 ng/mL (ref 0.8–2.0)

## 2015-08-02 LAB — BASIC METABOLIC PANEL
Anion gap: 7 (ref 5–15)
BUN: 35 mg/dL — AB (ref 6–20)
CHLORIDE: 92 mmol/L — AB (ref 101–111)
CO2: 36 mmol/L — AB (ref 22–32)
Calcium: 8.5 mg/dL — ABNORMAL LOW (ref 8.9–10.3)
Creatinine, Ser: 1.68 mg/dL — ABNORMAL HIGH (ref 0.44–1.00)
GFR calc Af Amer: 32 mL/min — ABNORMAL LOW (ref >60)
GFR, EST NON AFRICAN AMERICAN: 27 mL/min — AB (ref >60)
GLUCOSE: 93 mg/dL (ref 65–99)
POTASSIUM: 4 mmol/L (ref 3.5–5.1)
Sodium: 135 mmol/L (ref 135–145)

## 2015-08-02 LAB — PROTIME-INR
INR: 2.02
Prothrombin Time: 22.7 seconds — ABNORMAL HIGH (ref 11.4–15.0)

## 2015-08-02 LAB — T3: T3, Total: 73 ng/dL (ref 71–180)

## 2015-08-02 LAB — MAGNESIUM: Magnesium: 2.3 mg/dL (ref 1.7–2.4)

## 2015-08-02 MED ORDER — SALINE SPRAY 0.65 % NA SOLN
1.0000 | NASAL | Status: DC | PRN
  Filled 2015-08-02: qty 44

## 2015-08-02 MED ORDER — SENNA 8.6 MG PO TABS
1.0000 | ORAL_TABLET | Freq: Every day | ORAL | Status: DC
  Administered 2015-08-02 – 2015-08-04 (×3): 8.6 mg via ORAL
  Filled 2015-08-02 (×3): qty 1

## 2015-08-02 NOTE — Progress Notes (Signed)
RN spoke with Dr. Ether Griffins and MD gave order that patient may be transferred to any floor with tele monitor.

## 2015-08-02 NOTE — Progress Notes (Signed)
ANTICOAGULATION CONSULT NOTE - Initial Consult  Pharmacy Consult for warfarin Indication: atrial fibrillation  Allergies  Allergen Reactions  . Biaxin [Clarithromycin] Other (See Comments)    Reaction:  Numbness of lips   . Flagyl [Metronidazole] Other (See Comments)    Reaction:  Numbness of lips   . Penicillins Rash and Other (See Comments)    Pt is unable to answer additional questions about this medication because it happened so long ago.  . Tetracyclines & Related Rash    Patient Measurements: Height: 5\' 4"  (162.6 cm) Weight: 104 lb 4.4 oz (47.3 kg) IBW/kg (Calculated) : 54.7  Vital Signs: Temp: 98.4 F (36.9 C) (05/26 1946) Temp Source: Oral (05/26 1946) BP: 102/57 mmHg (05/26 1946) Pulse Rate: 74 (05/26 1946)  Labs:  Recent Labs  07/31/15 0049 07/31/15 1432 08/01/15 0322 08/02/15 0216  HGB 12.0  --  10.9*  --   HCT 36.6  --  33.4*  --   PLT 205  --  168  --   LABPROT 18.7*  --  19.7* 22.7*  INR 1.56  --  1.67 2.02  CREATININE 2.39* 2.00* 2.04* 1.68*  TROPONINI 0.05*  --   --   --     Estimated Creatinine Clearance: 19.6 mL/min (by C-G formula based on Cr of 1.68).   Medical History: Past Medical History  Diagnosis Date  . Atrial fibrillation ([place])   . CHF (congestive heart failure) ([place])   . Shingles   . CAD (coronary artery disease)   . Pulmonary hypertension ([place])   . GERD (gastroesophageal reflux disease)   . CKD (chronic kidney disease)     Assessment:  Pharmacy consulted for warfarin dosing for 82 yo female on warfarin as an outpatient for atrial fibrillation, goal INR 2-3. PTA lists warfarin 5 mg po three times a week and 2.5 mg po four times a week for 25 mg weekly.   Goal of Therapy:  INR 2-3 Monitor platelets by anticoagulation protocol: Yes   Plan:  Will continue patient on warfarin 3mg  q1800. Will obtain INR with am labs.   Pharmacy will continue to monitor and adjust per consult.    Arnette Driggs L 08/02/2015,8:50 PM

## 2015-08-02 NOTE — Progress Notes (Signed)
[facility]  ROUNDING NOTE   Subjective:  Creatinine currently down to 1.68. Urine output of 875 cc over the preceding 24 hours. Patient sitting up in chair this a.m.  Objective:  Vital signs in last 24 hours:  Temp:  [97.9 F (36.6 C)-98.4 F (36.9 C)] 97.9 F (36.6 C) (05/26 0800) Pulse Rate:  [50-127] 91 (05/26 0810) Resp:  [11-26] 21 (05/26 0810) BP: (76-109)/(37-75) 100/69 mmHg (05/26 0810) SpO2:  [90 %-100 %] 100 % (05/26 0810)  Weight change:  Filed Weights   07/31/15 0423  Weight: 47.3 kg (104 lb 4.4 oz)    Intake/Output: I/O last 3 completed shifts: In: 756.3 [P.O.:480; I.V.:76.3; IV Piggyback:200] Out: 2750 [Urine:2750]   Intake/Output this shift:     Physical Exam: General: NAD, sitting up in chair  Head: Normocephalic, atraumatic. Moist oral mucosal membranes  Eyes: Anicteric  Neck: Supple, trachea midline  Lungs:  Minimal basilar rales  Heart: S1S2 irregular no rubs  Abdomen:  Soft, nontender, BS present  Extremities: trace peripheral edema.  Neurologic: Nonfocal, moving all four extremities  Skin: No lesions       Basic Metabolic Panel:  Recent Labs Lab 07/31/15 0049 07/31/15 1432 08/01/15 0322 08/02/15 0216  NA 132* 134* 135 135  K 4.5 4.1 3.6 4.0  CL 95* 96* 95* 92*  CO2 26 29 33* 36*  GLUCOSE 102* 127* 91 93  BUN 44* 40* 38* 35*  CREATININE 2.39* 2.00* 2.04* 1.68*  CALCIUM 8.9 8.7* 8.5* 8.5*  MG  --   --  1.9 2.3    Liver Function Tests:  Recent Labs Lab 07/31/15 0049 07/31/15 1432  AST  --  26  ALT  --  14  ALKPHOS  --  84  BILITOT  --  1.2  PROT  --  6.0*  ALBUMIN 3.3* 2.9*   No results for input(s): LIPASE, AMYLASE in the last 168 hours. No results for input(s): AMMONIA in the last 168 hours.  CBC:  Recent Labs Lab 07/31/15 0049 08/01/15 0322  WBC 6.4 5.6  NEUTROABS  --  4.2  HGB 12.0 10.9*  HCT 36.6 33.4*  MCV 83.1 83.2  PLT 205 168    Cardiac Enzymes:  Recent Labs Lab 07/31/15 0049   TROPONINI 0.05*    BNP: Invalid input(s): POCBNP  CBG:  Recent Labs Lab 07/31/15 0423  GLUCAP 62    Microbiology: Results for orders placed or performed during the hospital encounter of 07/31/15  MRSA PCR Screening     Status: None   Collection Time: 07/31/15  4:30 AM  Result Value Ref Range Status   MRSA by PCR NEGATIVE NEGATIVE Final    Comment:        The GeneXpert MRSA Assay (FDA approved for NASAL specimens only), is one component of a comprehensive MRSA colonization surveillance program. It is not intended to diagnose MRSA infection nor to guide or monitor treatment for MRSA infections.     Coagulation Studies:  Recent Labs  07/31/15 0049 08/01/15 0322 08/02/15 0216  LABPROT 18.7* 19.7* 22.7*  INR 1.56 1.67 2.02    Urinalysis: No results for input(s): COLORURINE, LABSPEC, PHURINE, GLUCOSEU, HGBUR, BILIRUBINUR, KETONESUR, PROTEINUR, UROBILINOGEN, NITRITE, LEUKOCYTESUR in the last 72 hours.  Invalid input(s): APPERANCEUR    Imaging: Dg Chest Port 1 View  07/31/2015  CLINICAL DATA:  PICC placement EXAM: PORTABLE CHEST 1 VIEW COMPARISON:  Earlier today FINDINGS: New central line into the right IJ with tip likely at the level of the  upper cavoatrial junction (limited by overlapping dual-chamber pacer leads which showed no interval placement). Low volume chest with pleural effusions and obscured lower lobes. Chronic cardiomegaly. Status post median sternotomy with mitral valve annuloplasty. IMPRESSION: 1. No acute finding related to central line placement. Tip at the upper cavoatrial junction. 2. Low volume chest with bilateral pleural effusion obscuring the lower lobes. Electronically Signed   By: Monte Fantasia M.D.   On: 07/31/2015 14:34     Medications:     . antiseptic oral rinse  7 mL Mouth Rinse BID  . aspirin EC  81 mg Oral Daily  . atorvastatin  20 mg Oral QHS  . calcium-vitamin D  1 tablet Oral Daily  . cholecalciferol  2,000 Units Oral  Daily  . digoxin  0.125 mg Oral Daily  . docusate sodium  100 mg Oral BID  . dronedarone  400 mg Oral BID WC  . furosemide  60 mg Intravenous Daily  . gabapentin  100 mg Oral BID  . metoprolol succinate  25 mg Oral Daily  . pantoprazole  40 mg Oral QAC breakfast  . polyethylene glycol  17 g Oral Daily  . potassium chloride  30 mEq Oral BID  . sodium chloride flush  3 mL Intravenous Q12H  . spironolactone  12.5 mg Oral Daily  . vitamin B-12  1,000 mcg Oral Daily  . warfarin  3 mg Oral q1800  . Warfarin - Pharmacist Dosing Inpatient   Does not apply q1800   acetaminophen **OR** acetaminophen, morphine injection, ondansetron **OR** ondansetron (ZOFRAN) IV  Assessment/ Plan:  82 y.o. female with a PMHx of chronic systolic heart faiulre EF 15%, atrial fibrillation, coronary artery disease, CKD stage III baseline Cr 1.03 with egfr of 65, pulmonary hypertension, coronary artery disease s/p CABG, history of pacemaker placement who presents now with shortness of breath due to heart failure exacerbation.  1.  Acute renal failure/chronic and disease stage III Baseline creatinine 1.03 with EGFR 56. It appears that the patient has had another episode of acute renal failure likely secondary to altered cardiorenal hemodynamics. She has had weight gain and increasing lower extremity edema. - Renal function continues to improve slowly. BUN down to 35 and creatinine down to 1.6. Therefore we will continue the patient on diuresis to improve her cardiorenal hemodynamics.  Continue Lasix 60 mg IV daily for now.  2. Acute systolic heart failure exacerbation. Continues to improve slowly. Urine output was a bit down as compared to the preceding 24 hours. For now continue Lasix 60 mg IV daily.  3. Hypotension. Improved, now off pressors. Continue to monitor blood pressure trend.    LOS: 2 Doraine Schexnider 5/26/20179:01 AM

## 2015-08-02 NOTE — Progress Notes (Signed)
Patient transferred from ICU to 2A [id number], report from [place], ICU RN. Oriented to room, Ascom phones, call bell and staff. Bed in low position. Fall safety plan reviewed, yellow non-skid socks in place, bed alarm on. Full assessment to Epic; skin assessed with Rockie Neighbours., RN. Telemetry box verified with tele clerk and Payton Spark, RN: [phone]. Will continue to monitor.

## 2015-08-02 NOTE — Progress Notes (Signed)
Report called to Maddie, RN on 2A. Patient has been A&Ox4.  Afib per cardiac monitor, rate controlled.  VSS.  Tolerating diet.

## 2015-08-02 NOTE — Progress Notes (Signed)
[place] at [place] NAME: Sheryl Hughes    MR#:  [id number]  DATE OF BIRTH:  December 03, 1931  SUBJECTIVE:  CHIEF COMPLAINT:  Patient is feeling better . SOB and LE swelling  Have  improved  Daughter at bedside, Discussed extensively. The urinary output was 875 cc in the past 24 hours, total fluid negative of approximately 2.5 L since admission  REVIEW OF SYSTEMS:  CONSTITUTIONAL: No fever, fatigue or weakness.  EYES: No blurred or double vision.  EARS, NOSE, AND THROAT: No tinnitus or ear pain.  RESPIRATORY: No cough,Reporting exertional shortness of breath, denies any wheezing or hemoptysis.  CARDIOVASCULAR: No chest pain, orthopnea, edema.  GASTROINTESTINAL: No nausea, vomiting, diarrhea or abdominal pain.  GENITOURINARY: No dysuria, hematuria.  ENDOCRINE: No polyuria, nocturia,  HEMATOLOGY: No anemia, easy bruising or bleeding SKIN: No rash or lesion.leg swelling is better MUSCULOSKELETAL: No joint pain or arthritis.   NEUROLOGIC: No tingling, numbness, weakness.  PSYCHIATRY: No anxiety or depression.   DRUG ALLERGIES:   Allergies  Allergen Reactions  . Biaxin [Clarithromycin] Other (See Comments)    Reaction:  Numbness of lips   . Flagyl [Metronidazole] Other (See Comments)    Reaction:  Numbness of lips   . Penicillins Rash and Other (See Comments)    Pt is unable to answer additional questions about this medication because it happened so long ago.  . Tetracyclines & Related Rash    VITALS:  Blood pressure 104/57, pulse 107, temperature 98.1 F (36.7 C), temperature source Oral, resp. rate 25, height 5\' 4"  (1.626 m), weight 47.3 kg (104 lb 4.4 oz), SpO2 96 %.  PHYSICAL EXAMINATION:  GENERAL:  82 y.o.-year-old patient lying in the bed with no acute distress.  EYES: Pupils equal, round, reactive to light and accommodation. No scleral icterus. Extraocular muscles intact.  HEENT: Head atraumatic, normocephalic. Oropharynx and  nasopharynx clear.  NECK:  Supple, no jugular venous distention. No thyroid enlargement, no tenderness.  LUNGS: Diminished breath sounds bilaterally at bases, no wheezing, rales,rhonchi or crepitation. No use of accessory muscles of respiration.  CARDIOVASCULAR: S1, S2 , irregularly irregular. No murmurs, rubs, or gallops.  ABDOMEN: Soft, nontender, nondistended. Bowel sounds present. No organomegaly or mass.  EXTREMITIES: 1+ pedal edema, no cyanosis, or clubbing.  NEUROLOGIC: Cranial nerves II through XII are intact. Muscle strength 5/5 in all extremities. Sensation intact. Gait not checked.  PSYCHIATRIC: The patient is alert and oriented x 3.  SKIN: No obvious rash, lesion, or ulcer.    LABORATORY PANEL:   CBC  Recent Labs Lab 08/01/15 0322  WBC 5.6  HGB 10.9*  HCT 33.4*  PLT 168   ------------------------------------------------------------------------------------------------------------------  Chemistries   Recent Labs Lab 07/31/15 1432  08/02/15 0216  NA 134*  < > 135  K 4.1  < > 4.0  CL 96*  < > 92*  CO2 29  < > 36*  GLUCOSE 127*  < > 93  BUN 40*  < > 35*  CREATININE 2.00*  < > 1.68*  CALCIUM 8.7*  < > 8.5*  MG  --   < > 2.3  AST 26  --   --   ALT 14  --   --   ALKPHOS 84  --   --   BILITOT 1.2  --   --   < > = values in this interval not displayed. ------------------------------------------------------------------------------------------------------------------  Cardiac Enzymes  Recent Labs Lab 07/31/15 0049  TROPONINI 0.05*   ------------------------------------------------------------------------------------------------------------------  RADIOLOGY:  No results found.  EKG:   Orders placed or performed during the hospital encounter of 07/31/15  . ED EKG  . ED EKG  . EKG 12-Lead  . EKG 12-Lead  . EKG 12-Lead  . EKG 12-Lead    ASSESSMENT AND PLAN:   This is an 82 year old female admitted for acute exacerbation of systolic heart failure  and hypotension. 1. CHF: Acute on chronic; systolic. NYHA stage IV. AICD in place. Last ejection fraction reportedly 15-20% area The patient received Lasix 10 mg IV in the emergency department due to hypotension.  Patient is off Lasix [employer].it out put 2.5 lit since admission.  Dopamine was discontinued and patient was started on dobutamine gtt , now off it to support the blood pressure , goal MAP >60 Lasix 60 iv q daily Appreciate cardio and nephro recs  2. Hypotension: MAP at 65  Dopamine discontinued and patient is started on dobutamine low-dose infusion The patient is stable and asymptomatic. She is off all pressors, transfer patient to off unit bed  3. Acute on chronic kidney failure: Secondary to aggressive diuretic therapy.  Appreciate nephro to help guide our diuretic therapy. Function has improved, creatinine is 1.68 today  4. Atrial fibrillation: Rate controlled but INR was subtherapeutic. warfarin consult per pharmacy, INR is 2.02. today Continue metoprolol and dronedarone.  5. Coronary artery disease: Stable; continue aspirin and statin therapy for secondary prevention  6. DVT prophylaxis: Full dose anticoagulation as above  7. GI prophylaxis: PPI per home regimen     All the records are reviewed and case discussed with Care Management/Social Workerr. Management plans discussed with the patient, family and they are in agreement.  CODE STATUS: FC  TOTAL CARE taking care of this patient  40 minutes Discussed this patient's daughter, all questions were answered, emotional support was provided POSSIBLE D/C IN ?DAYS, DEPENDING ON CLINICAL CONDITION.   Theodoro Grist M.D on 08/02/2015 at 3:32 PM  Between 7am to 6pm - Pager - [phone] After 6pm go to [email or web address] - password [email or web address] [facility]  Office  [phone]  CC: Primary care physician; Kirk Ruths., MD

## 2015-08-02 NOTE — Progress Notes (Signed)
MEDICATION RELATED CONSULT NOTE - INITIAL   Pharmacy Consult for electrolyte management   Allergies  Allergen Reactions  . Biaxin [Clarithromycin] Other (See Comments)    Reaction:  Numbness of lips   . Flagyl [Metronidazole] Other (See Comments)    Reaction:  Numbness of lips   . Penicillins Rash and Other (See Comments)    Pt is unable to answer additional questions about this medication because it happened so long ago.  . Tetracyclines & Related Rash    Patient Measurements: Height: 5\' 4"  (162.6 cm) Weight: 104 lb 4.4 oz (47.3 kg) IBW/kg (Calculated) : 54.7  Vital Signs: Temp: 98.4 F (36.9 C) (05/26 1946) Temp Source: Oral (05/26 1946) BP: 102/57 mmHg (05/26 1946) Pulse Rate: 74 (05/26 1946) Intake/Output from previous day: 05/25 0701 - 05/26 0700 In: 695 [P.O.:480; I.V.:15; IV Piggyback:200] Out: 875 [Urine:875] Intake/Output from this shift: Total I/O In: -  Out: 300 [Urine:300]  Labs:  Recent Labs  07/31/15 0049 07/31/15 1432 08/01/15 0322 08/02/15 0216  WBC 6.4  --  5.6  --   HGB 12.0  --  10.9*  --   HCT 36.6  --  33.4*  --   PLT 205  --  168  --   CREATININE 2.39* 2.00* 2.04* 1.68*  MG  --   --  1.9 2.3  ALBUMIN 3.3* 2.9*  --   --   PROT  --  6.0*  --   --   AST  --  26  --   --   ALT  --  14  --   --   ALKPHOS  --  84  --   --   BILITOT  --  1.2  --   --    Estimated Creatinine Clearance: 19.6 mL/min (by C-G formula based on Cr of 1.68).  Assessment: Pharmacy consulted for electrolyte management for 82 yo female admitted with CHF exacerbation. Patient is currently ordered potassium 24mEq PO BID ad furosemide 60mg  IV daily.     Plan:  Electrolytes are within normal limits. BMP is ordered with am labs.    Pharmacy will continue to monitor and adjust per consult.   Simpson,Michael L 08/02/2015,8:51 PM

## 2015-08-02 NOTE — Progress Notes (Signed)
Patient moved to room 249 by wheelchair with Darryl Lent, RN.  Patient on ICU tele when leaving unit to go to 2A.

## 2015-08-03 LAB — BASIC METABOLIC PANEL
ANION GAP: 5 (ref 5–15)
BUN: 29 mg/dL — ABNORMAL HIGH (ref 6–20)
CALCIUM: 8.4 mg/dL — AB (ref 8.9–10.3)
CO2: 37 mmol/L — ABNORMAL HIGH (ref 22–32)
Chloride: 93 mmol/L — ABNORMAL LOW (ref 101–111)
Creatinine, Ser: 1.49 mg/dL — ABNORMAL HIGH (ref 0.44–1.00)
GFR, EST AFRICAN AMERICAN: 37 mL/min — AB (ref >60)
GFR, EST NON AFRICAN AMERICAN: 32 mL/min — AB (ref >60)
GLUCOSE: 90 mg/dL (ref 65–99)
POTASSIUM: 4.5 mmol/L (ref 3.5–5.1)
SODIUM: 135 mmol/L (ref 135–145)

## 2015-08-03 LAB — PROTIME-INR
INR: 2.37
Prothrombin Time: 25.6 seconds — ABNORMAL HIGH (ref 11.4–15.0)

## 2015-08-03 NOTE — Progress Notes (Signed)
ANTICOAGULATION CONSULT NOTE - Initial Consult  Pharmacy Consult for warfarin Indication: atrial fibrillation  Allergies  Allergen Reactions  . Biaxin [Clarithromycin] Other (See Comments)    Reaction:  Numbness of lips   . Flagyl [Metronidazole] Other (See Comments)    Reaction:  Numbness of lips   . Penicillins Rash and Other (See Comments)    Pt is unable to answer additional questions about this medication because it happened so long ago.  . Tetracyclines & Related Rash    Patient Measurements: Height: 5\' 4"  (162.6 cm) Weight: 85 lb 12.8 oz (38.919 kg) IBW/kg (Calculated) : 54.7  Vital Signs: Temp: 98.2 F (36.8 C) (05/27 0418) Temp Source: Oral (05/27 0418) BP: 95/61 mmHg (05/27 0418) Pulse Rate: 70 (05/27 0418)  Labs:  Recent Labs  08/01/15 0322 08/02/15 0216 08/03/15 0545  HGB 10.9*  --   --   HCT 33.4*  --   --   PLT 168  --   --   LABPROT 19.7* 22.7* 25.6*  INR 1.67 2.02 2.37  CREATININE 2.04* 1.68* 1.49*    Estimated Creatinine Clearance: 18.2 mL/min (by C-G formula based on Cr of 1.49).   Medical History: Past Medical History  Diagnosis Date  . Atrial fibrillation ([place])   . CHF (congestive heart failure) ([place])   . Shingles   . CAD (coronary artery disease)   . Pulmonary hypertension ([place])   . GERD (gastroesophageal reflux disease)   . CKD (chronic kidney disease)     Assessment:  Pharmacy consulted for warfarin dosing for 82 yo female on warfarin as an outpatient for atrial fibrillation, goal INR 2-3. PTA lists warfarin 5 mg po three times a week and 2.5 mg po four times a week for 25 mg weekly.   Goal of Therapy:  INR 2-3 Monitor platelets by anticoagulation protocol: Yes   Plan:  INR 2.37 this morning. Continue current dose. Will recheck INR with AM labs.  Laural Benes, Pharm.D., BCPS Clinical Pharmacist 08/03/2015,6:44 AM

## 2015-08-03 NOTE — Progress Notes (Signed)
Patients BP on the low side this morning, see below:  Filed Vitals:   08/03/15 0740 08/03/15 0741  BP: 86/52 129/94  Pulse: 104 80  Temp:    Resp:     Dr. Saralyn Pilar rounding and asked if he wanted scheduled metoprolol, spironolactone and IV lasix given. MD reports that he does as patients BP stays low and she is A&O, and in NAD. Will continue to monitor.

## 2015-08-03 NOTE — Progress Notes (Signed)
[facility] Cardiology  SUBJECTIVE: I feel much better   Filed Vitals:   08/02/15 1946 08/03/15 0418 08/03/15 0740 08/03/15 0741  BP: 102/57 95/61 86/52  129/94  Pulse: 74 70 104 80  Temp: 98.4 F (36.9 C) 98.2 F (36.8 C)    TempSrc: Oral Oral    Resp: 16 16    Height:      Weight:  38.919 kg (85 lb 12.8 oz)    SpO2: 95% 91%       Intake/Output Summary (Last 24 hours) at 08/03/15 0956 Last data filed at 08/03/15 [id number]  Gross per 24 hour  Intake    720 ml  Output   1250 ml  Net   -530 ml      PHYSICAL EXAM  General: Well developed, well nourished, in no acute distress HEENT:  Normocephalic and atramatic Neck:  No JVD.  Lungs: Clear bilaterally to auscultation and percussion. Heart: HRRR . Normal S1 and S2 without gallops or murmurs.  Abdomen: Bowel sounds are positive, abdomen soft and non-tender  Msk:  Back normal, normal gait. Normal strength and tone for age. Extremities: No clubbing, cyanosis or edema.   Neuro: Alert and oriented X 3. Psych:  Good affect, responds appropriately   LABS: Basic Metabolic Panel:  Recent Labs  08/01/15 0322 08/02/15 0216 08/03/15 0545  NA 135 135 135  K 3.6 4.0 4.5  CL 95* 92* 93*  CO2 33* 36* 37*  GLUCOSE 91 93 90  BUN 38* 35* 29*  CREATININE 2.04* 1.68* 1.49*  CALCIUM 8.5* 8.5* 8.4*  MG 1.9 2.3  --    Liver Function Tests:  Recent Labs  07/31/15 1432  AST 26  ALT 14  ALKPHOS 84  BILITOT 1.2  PROT 6.0*  ALBUMIN 2.9*   No results for input(s): LIPASE, AMYLASE in the last 72 hours. CBC:  Recent Labs  08/01/15 0322  WBC 5.6  NEUTROABS 4.2  HGB 10.9*  HCT 33.4*  MCV 83.2  PLT 168   Cardiac Enzymes: No results for input(s): CKTOTAL, CKMB, CKMBINDEX, TROPONINI in the last 72 hours. BNP: Invalid input(s): POCBNP D-Dimer: No results for input(s): DDIMER in the last 72 hours. Hemoglobin A1C: No results for input(s): HGBA1C in the last 72 hours. Fasting Lipid Panel: No results for input(s): CHOL, HDL,  LDLCALC, TRIG, CHOLHDL, LDLDIRECT in the last 72 hours. Thyroid Function Tests: No results for input(s): TSH, T4TOTAL, T3FREE, THYROIDAB in the last 72 hours.  Invalid input(s): FREET3 Anemia Panel: No results for input(s): VITAMINB12, FOLATE, FERRITIN, TIBC, IRON, RETICCTPCT in the last 72 hours.  No results found.   Echo   TELEMETRY: A sensed V paced:  ASSESSMENT AND PLAN:  Active Problems:   Acute exacerbation of CHF (congestive heart failure) ([place])    1. Acute on chronic systolic congestive heart failure, improved after diuresis 2. Atrial fibrillation, converted to sinus rhythm, on warfarin for stroke prevention, and amiodarone for rhythm control 3. Chronic kidney disease, mild elevation of BUN/creatinine creatinine following diuresis, stabilized  Recommendations  1. Continue diuresis, carefully monitor renal status 2. Continue warfarin for stroke prevention, target INR 2.0-3.0 3. Continue amiodarone for rhythm control, decrease to 400 mg daily prior to discharge   Isaias Cowman, MD, PhD, [facility] 08/03/2015 9:56 AM

## 2015-08-03 NOTE — Progress Notes (Signed)
[place] at [place] NAME: Sheryl Hughes    MR#:  [id number]  DATE OF BIRTH:  1931-05-19  SUBJECTIVE:  CHIEF COMPLAINT:  Patient is feeling better . SOB Has  improved, however, patient admits of some swelling in lower extremities, possibly more than yesterday  Patient's son is at bedside, as well as daughter-in-law and patient's husband. Discussed extensively. The urinary output was 750 cc in the past 24 hours, total fluid negative of approximately 3.2 L since admission. The kidney function has been improving. Patient's blood pressure intermittently remains low in 80s to 90s. Patient's oxygenation is stable, patient is on room air with saturations of 91-92%. The patient has not been tested yet for home oxygen needs  REVIEW OF SYSTEMS:  CONSTITUTIONAL: No fever, fatigue or weakness.  EYES: No blurred or double vision.  EARS, NOSE, AND THROAT: No tinnitus or ear pain.  RESPIRATORY: No cough,Reporting exertional shortness of breath, denies any wheezing or hemoptysis.  CARDIOVASCULAR: No chest pain, orthopnea, edema.  GASTROINTESTINAL: No nausea, vomiting, diarrhea or abdominal pain.  GENITOURINARY: No dysuria, hematuria.  ENDOCRINE: No polyuria, nocturia,  HEMATOLOGY: No anemia, easy bruising or bleeding SKIN: No rash or lesion.leg swelling is better MUSCULOSKELETAL: No joint pain or arthritis.   NEUROLOGIC: No tingling, numbness, weakness.  PSYCHIATRY: No anxiety or depression.   DRUG ALLERGIES:   Allergies  Allergen Reactions  . Biaxin [Clarithromycin] Other (See Comments)    Reaction:  Numbness of lips   . Flagyl [Metronidazole] Other (See Comments)    Reaction:  Numbness of lips   . Penicillins Rash and Other (See Comments)    Pt is unable to answer additional questions about this medication because it happened so long ago.  . Tetracyclines & Related Rash    VITALS:  Blood pressure 86/55, pulse 79, temperature 98.4 F (36.9 C),  temperature source Oral, resp. rate 18, height 5\' 4"  (1.626 m), weight 38.919 kg (85 lb 12.8 oz), SpO2 92 %.  PHYSICAL EXAMINATION:  GENERAL:  82 y.o.-year-old patient lying in the bed with no acute distress.  EYES: Pupils equal, round, reactive to light and accommodation. No scleral icterus. Extraocular muscles intact.  HEENT: Head atraumatic, normocephalic. Oropharynx and nasopharynx clear.  NECK:  Supple, no jugular venous distention. No thyroid enlargement, no tenderness.  LUNGS: Diminished breath sounds bilaterally at bases, no wheezing, rales,rhonchi or crepitation. No use of accessory muscles of respiration.  CARDIOVASCULAR: S1, S2 , irregularly irregular. No murmurs, rubs, or gallops.  ABDOMEN: Soft, nontender, nondistended. Bowel sounds present. No organomegaly or mass.  EXTREMITIES: 1+ pedal edema,mostly around the ankles and feet,  no cyanosis, or clubbing.  NEUROLOGIC: Cranial nerves II through XII are intact. Muscle strength 5/5 in all extremities. Sensation intact. Gait not checked.  PSYCHIATRIC: The patient is alert and oriented x 3.  SKIN: No obvious rash, lesion, or ulcer.    LABORATORY PANEL:   CBC  Recent Labs Lab 08/01/15 0322  WBC 5.6  HGB 10.9*  HCT 33.4*  PLT 168   ------------------------------------------------------------------------------------------------------------------  Chemistries   Recent Labs Lab 07/31/15 1432  08/02/15 0216 08/03/15 0545  NA 134*  < > 135 135  K 4.1  < > 4.0 4.5  CL 96*  < > 92* 93*  CO2 29  < > 36* 37*  GLUCOSE 127*  < > 93 90  BUN 40*  < > 35* 29*  CREATININE 2.00*  < > 1.68* 1.49*  CALCIUM 8.7*  < >  8.5* 8.4*  MG  --   < > 2.3  --   AST 26  --   --   --   ALT 14  --   --   --   ALKPHOS 84  --   --   --   BILITOT 1.2  --   --   --   < > = values in this interval not displayed. ------------------------------------------------------------------------------------------------------------------  Cardiac  Enzymes  Recent Labs Lab 07/31/15 0049  TROPONINI 0.05*   ------------------------------------------------------------------------------------------------------------------  RADIOLOGY:  No results found.  EKG:   Orders placed or performed during the hospital encounter of 07/31/15  . ED EKG  . ED EKG  . EKG 12-Lead  . EKG 12-Lead  . EKG 12-Lead  . EKG 12-Lead    ASSESSMENT AND PLAN:   This is an 82 year old female admitted for acute exacerbation of systolic heart failure and hypotension. 1. CHF: Acute on chronic; systolic. NYHA stage IV. AICD in place. Last ejection fraction reportedly 15-20% area The patient received Lasix 10 mg IV in the emergency department due to hypotension.  Patient is off Lasix [employer].it out put 3.2 lit since admission.  Dopamine was discontinued. The patient is to be continued on Lasix 60 iv q daily, Change to oral, likely tomorrow  Appreciate cardio and nephro recs  2. Hypotension:The patient was managed on dopamine, which was changed to  dobutamine low-dose infusionwhile in the hospital, now discontinued.  The patient is stable and asymptomatic.   3. Acute on chronic kidney failure: Secondary to aggressive diuretic therapy.  Appreciate nephro to help guide our diuretic therapy. Function has improved, creatinine is 1.49 today  4. Atrial fibrillation: Rate controlled with metoprolol and dronedarone  , INR was  initially subtherapeutic. warfarin consult per pharmacy, INR is 2.37. Today. Continue metoprolol and dronedarone.May need to hold the metoprolol intermittently due to hypotension  5. Coronary artery disease: Stable; continue aspirin and statin therapy for secondary prevention  6. DVT prophylaxis: Full dose anticoagulation as above  7. GI prophylaxis: PPI per home regimen     All the records are reviewed and case discussed with Care Management/Social Workerr. Management plans discussed with the patient, family and they are in  agreement.  CODE STATUS: FC  TOTAL CARE taking care of this patient  40 minutes Discussed this patient's Son, husband, all questions were answered, emotional support was provided POSSIBLE D/C IN ?DAYS, DEPENDING ON CLINICAL CONDITION.   Theodoro Grist M.D on 08/03/2015 at 2:28 PM  Between 7am to 6pm - Pager - [phone] After 6pm go to [email or web address] - password [email or web address] [facility]  Office  [phone]  CC: Primary care physician; Kirk Ruths., MD

## 2015-08-03 NOTE — Progress Notes (Signed)
Central tele called pt. Had an 8 beat run of v-tach, Dr. Posey Pronto notified, no new orders at this time. Pt. Is in no distress, with no complaints at this time. She is resting comfortably in bed. Will continue to monitor pt.

## 2015-08-03 NOTE — Progress Notes (Signed)
[facility]  ROUNDING NOTE   Subjective:  Renal function continues to improve. BUN down to 29 and creatinine currently 1.49. Patient doing well with floor care.  Objective:  Vital signs in last 24 hours:  Temp:  [98.2 F (36.8 C)-98.4 F (36.9 C)] 98.4 F (36.9 C) (05/27 1109) Pulse Rate:  [70-104] 79 (05/27 1109) Resp:  [16-18] 18 (05/27 1109) BP: (86-129)/(52-94) 86/55 mmHg (05/27 1109) SpO2:  [91 %-95 %] 92 % (05/27 1109) Weight:  [38.919 kg (85 lb 12.8 oz)] 38.919 kg (85 lb 12.8 oz) (05/27 0418)  Weight change:  Filed Weights   07/31/15 0423 08/03/15 0418  Weight: 47.3 kg (104 lb 4.4 oz) 38.919 kg (85 lb 12.8 oz)    Intake/Output: I/O last 3 completed shifts: In: 27 [P.O.:960; I.V.:3] Out: 1250 [Urine:750; Stool:500]   Intake/Output this shift:  Total I/O In: 240 [P.O.:240] Out: 100 [Urine:100]  Physical Exam: General: NAD, sitting up in chair  Head: Normocephalic, atraumatic. Moist oral mucosal membranes  Eyes: Anicteric  Neck: Supple, trachea midline  Lungs:  Minimal basilar rales  Heart: S1S2 irregular no rubs  Abdomen:  Soft, nontender, BS present  Extremities: trace peripheral edema.  Neurologic: Nonfocal, moving all four extremities  Skin: No lesions       Basic Metabolic Panel:  Recent Labs Lab 07/31/15 0049 07/31/15 1432 08/01/15 0322 08/02/15 0216 08/03/15 0545  NA 132* 134* 135 135 135  K 4.5 4.1 3.6 4.0 4.5  CL 95* 96* 95* 92* 93*  CO2 26 29 33* 36* 37*  GLUCOSE 102* 127* 91 93 90  BUN 44* 40* 38* 35* 29*  CREATININE 2.39* 2.00* 2.04* 1.68* 1.49*  CALCIUM 8.9 8.7* 8.5* 8.5* 8.4*  MG  --   --  1.9 2.3  --     Liver Function Tests:  Recent Labs Lab 07/31/15 0049 07/31/15 1432  AST  --  26  ALT  --  14  ALKPHOS  --  84  BILITOT  --  1.2  PROT  --  6.0*  ALBUMIN 3.3* 2.9*   No results for input(s): LIPASE, AMYLASE in the last 168 hours. No results for input(s): AMMONIA in the last 168  hours.  CBC:  Recent Labs Lab 07/31/15 0049 08/01/15 0322  WBC 6.4 5.6  NEUTROABS  --  4.2  HGB 12.0 10.9*  HCT 36.6 33.4*  MCV 83.1 83.2  PLT 205 168    Cardiac Enzymes:  Recent Labs Lab 07/31/15 0049  TROPONINI 0.05*    BNP: Invalid input(s): POCBNP  CBG:  Recent Labs Lab 07/31/15 0423  GLUCAP 45    Microbiology: Results for orders placed or performed during the hospital encounter of 07/31/15  MRSA PCR Screening     Status: None   Collection Time: 07/31/15  4:30 AM  Result Value Ref Range Status   MRSA by PCR NEGATIVE NEGATIVE Final    Comment:        The GeneXpert MRSA Assay (FDA approved for NASAL specimens only), is one component of a comprehensive MRSA colonization surveillance program. It is not intended to diagnose MRSA infection nor to guide or monitor treatment for MRSA infections.     Coagulation Studies:  Recent Labs  08/01/15 0322 08/02/15 0216 08/03/15 0545  LABPROT 19.7* 22.7* 25.6*  INR 1.67 2.02 2.37    Urinalysis: No results for input(s): COLORURINE, LABSPEC, PHURINE, GLUCOSEU, HGBUR, BILIRUBINUR, KETONESUR, PROTEINUR, UROBILINOGEN, NITRITE, LEUKOCYTESUR in the last 72 hours.  Invalid input(s): APPERANCEUR  Imaging: No results found.   Medications:     . antiseptic oral rinse  7 mL Mouth Rinse BID  . aspirin EC  81 mg Oral Daily  . atorvastatin  20 mg Oral QHS  . calcium-vitamin D  1 tablet Oral Daily  . cholecalciferol  2,000 Units Oral Daily  . digoxin  0.125 mg Oral Daily  . docusate sodium  100 mg Oral BID  . dronedarone  400 mg Oral BID WC  . furosemide  60 mg Intravenous Daily  . gabapentin  100 mg Oral BID  . metoprolol succinate  25 mg Oral Daily  . pantoprazole  40 mg Oral QAC breakfast  . polyethylene glycol  17 g Oral Daily  . potassium chloride  30 mEq Oral BID  . senna  1 tablet Oral QHS  . sodium chloride flush  3 mL Intravenous Q12H  . spironolactone  12.5 mg Oral Daily  . vitamin B-12   1,000 mcg Oral Daily  . warfarin  3 mg Oral q1800  . Warfarin - Pharmacist Dosing Inpatient   Does not apply q1800   acetaminophen **OR** acetaminophen, ondansetron **OR** ondansetron (ZOFRAN) IV, sodium chloride  Assessment/ Plan:  82 y.o. female with a PMHx of chronic systolic heart faiulre EF 15%, atrial fibrillation, coronary artery disease, CKD stage III baseline Cr 1.03 with egfr of 56, pulmonary hypertension, coronary artery disease s/p CABG, history of pacemaker placement who presents now with shortness of breath due to heart failure exacerbation.  1.  Acute renal failure/chronic and disease stage III Baseline creatinine 1.03 with EGFR 56. It appears that the patient has had another episode of acute renal failure likely secondary to altered cardiorenal hemodynamics. She has had weight gain and increasing lower extremity edema. - Renal function does continue to improve. BUN down to 29 with a creatinine of 1.49. Weight has come down during the admission as well. Continue Lasix 60 mg IV daily for 1 additional day and we may consider transition to a by mouth regimen tomorrow.  2. Acute systolic heart failure exacerbation. Weight currently down to 38.9 kg. Continue IV diuresis for now.  3. Hypotension. Patient continues to have periodic low blood pressure. Blood pressure currently is 86/55. Continue to monitor for now.    LOS: 3 Ioana Louks 5/27/20172:51 PM

## 2015-08-03 NOTE — Progress Notes (Signed)
Physical Therapy Evaluation Patient Details Name: Sheryl Hughes MRN: [id number] DOB: 1931/03/17 Today's Date: 08/03/2015   History of Present Illness  The patient with past medical history significant for systolic heart failure status post mitral valve repair and CABG with atrial fibrillation and chronic kidney disease presents emergency department complaining of shortness of breath.   Clinical Impression  Pt presents to PT alert and oriented and agreeable to getting up and walking.  VSS throughout session, pt able to ambulate 300' with RW with steady cadence and supervision only.  Pt with some SOB at end of session but able to recover with seated rest break.  Pt able to transfer on/off toilet with Mod I and perform personal hygiene.  Recommend pt to continue to use rolator at home and community and HHPT for general strengthening and edurance after d/c.  Continue ambulation with nursing, no further skilled acute PT needs.    Follow Up Recommendations Home health PT    Equipment Recommendations  Rolling walker with 5" wheels    Recommendations for Other Services       Precautions / Restrictions Precautions Precautions: Fall Precaution Comments: High Restrictions Weight Bearing Restrictions: No      Mobility  Bed Mobility Overal bed mobility: Modified Independent             General bed mobility comments: HOB at 45 degrees, able to sit up and rotate to sitting EOB with good effort.  Transfers Overall transfer level: Modified independent Equipment used: Rolling walker (2 wheeled)             General transfer comment: Sit<>stand from bed and toilet with Mod I; good anterior weight translation but slow lift off.  Ambulation/Gait Ambulation/Gait assistance: Supervision Ambulation Distance (Feet): 300 Feet Assistive device: Rolling walker (2 wheeled) Gait Pattern/deviations: Step-through pattern     General Gait Details: Steady gait using RW for confidence, SOB at end  of session, able to recover with seated rest break.  Stairs            Wheelchair Mobility    Modified Rankin (Stroke Patients Only)       Balance Overall balance assessment: Modified Independent                                           Pertinent Vitals/Pain Pain Assessment: No/denies pain    Home Living Family/patient expects to be discharged to:: Private residence Living Arrangements: Spouse/significant other Available Help at Discharge: Family Type of Home: House Home Access: Stairs to enter Entrance Stairs-Rails: Right Entrance Stairs-Number of Steps: 4 Home Layout: One level Home Equipment: [place] - 4 wheels;Cane - single point;Bedside commode      Prior Function Level of Independence: Needs assistance   Gait / Transfers Assistance Needed: no device inside home, rollator for longer distances  ADL's / Homemaking Assistance Needed: assitance for household cleaning, husband does laundry, pt helps make bed when able  Comments: recently stopped shopping at grocery store/[employer] due to energy conservation     Hand Dominance        Extremity/Trunk Assessment   Upper Extremity Assessment: Overall WFL for tasks assessed           Lower Extremity Assessment: Overall WFL for tasks assessed         Communication   Communication: No difficulties  Cognition Arousal/Alertness: Awake/alert Behavior During Therapy: [facility] for tasks assessed/performed  Overall Cognitive Status: Within Functional Limits for tasks assessed                      General Comments General comments (skin integrity, edema, etc.): slight B LE edema    Exercises        Assessment/Plan    PT Assessment Patent does not need any further PT services  PT Diagnosis Generalized weakness   PT Problem List    PT Treatment Interventions     PT Goals (Current goals can be found in the Care Plan section) Acute Rehab PT Goals Patient Stated Goal: To be less  short of breath. PT Goal Formulation: With patient Time For Goal Achievement: 08/10/15 Potential to Achieve Goals: Good    Frequency     Barriers to discharge        Co-evaluation               End of Session Equipment Utilized During Treatment: Gait belt Activity Tolerance: Patient limited by fatigue Patient left: in chair;with call bell/phone within reach Nurse Communication: Mobility status         Time: [id number] PT Time Calculation (min) (ACUTE ONLY): 32 min   Charges:   PT Evaluation $PT Eval Low Complexity: 1 Procedure PT Treatments $Therapeutic Exercise: 8-22 mins   PT G Codes:        Emily Forse A Mackynzie Woolford, PT 08/03/2015, 11:52 AM

## 2015-08-04 LAB — BASIC METABOLIC PANEL
Anion gap: 6 (ref 5–15)
BUN: 28 mg/dL — AB (ref 6–20)
CALCIUM: 8.6 mg/dL — AB (ref 8.9–10.3)
CHLORIDE: 94 mmol/L — AB (ref 101–111)
CO2: 35 mmol/L — ABNORMAL HIGH (ref 22–32)
CREATININE: 1.53 mg/dL — AB (ref 0.44–1.00)
GFR, EST AFRICAN AMERICAN: 36 mL/min — AB (ref >60)
GFR, EST NON AFRICAN AMERICAN: 31 mL/min — AB (ref >60)
Glucose, Bld: 87 mg/dL (ref 65–99)
Potassium: 4.5 mmol/L (ref 3.5–5.1)
Sodium: 135 mmol/L (ref 135–145)

## 2015-08-04 LAB — HEMOGLOBIN: HEMOGLOBIN: 11.3 g/dL — AB (ref 12.0–16.0)

## 2015-08-04 LAB — PROTIME-INR
INR: 2.39
Prothrombin Time: 25.8 seconds — ABNORMAL HIGH (ref 11.4–15.0)

## 2015-08-04 MED ORDER — FUROSEMIDE 40 MG PO TABS
40.0000 mg | ORAL_TABLET | Freq: Every day | ORAL | Status: DC
  Administered 2015-08-05: 40 mg via ORAL
  Filled 2015-08-04: qty 1

## 2015-08-04 NOTE — Progress Notes (Signed)
[facility] NAME: Waynetta Wennerstrom    MR#:  [id number]  DATE OF BIRTH:  [birth year]  SUBJECTIVE:  CHIEF COMPLAINT:  Patient is Having problems with on and off shortness of breath, dyspnea on exertion, some swelling in lower extremities, however, improved overall. Patient was ablated yesterday and her O2 sats did not drop down below 90 on exertion.  Patient's son is at bedside, as well as daughter-in-law and patient's husband. Discussed extensively. The urinary output was 950 cc in the past 24 hours on intravenous diuretic, total fluid negative of approximately 3.7 L since admission. The kidney function stable from yesterday. Patient is somewhat reluctant to return to home yet today, she received intravenous Lasix early in the morning.  REVIEW OF SYSTEMS:  CONSTITUTIONAL: No fever, fatigue or weakness.  EYES: No blurred or double vision.  EARS, NOSE, AND THROAT: No tinnitus or ear pain.  RESPIRATORY: No cough,Reporting exertional shortness of breath, denies any wheezing or hemoptysis.  CARDIOVASCULAR: No chest pain, orthopnea, edema.  GASTROINTESTINAL: No nausea, vomiting, diarrhea or abdominal pain.  GENITOURINARY: No dysuria, hematuria.  ENDOCRINE: No polyuria, nocturia,  HEMATOLOGY: No anemia, easy bruising or bleeding SKIN: No rash or lesion.leg swelling is better MUSCULOSKELETAL: No joint pain or arthritis.   NEUROLOGIC: No tingling, numbness, weakness.  PSYCHIATRY: No anxiety or depression.   DRUG ALLERGIES:   Allergies  Allergen Reactions  . Biaxin [Clarithromycin] Other (See Comments)    Reaction:  Numbness of lips   . Flagyl [Metronidazole] Other (See Comments)    Reaction:  Numbness of lips   . Penicillins Rash and Other (See Comments)    Pt is unable to answer additional questions about this medication because it happened so long ago.  . Tetracyclines & Related Rash    VITALS:  Blood pressure 92/49, pulse 70,  temperature 97.4 F (36.3 C), temperature source Oral, resp. rate 18, height 5\' 4"  (1.626 m), weight 37.739 kg (83 lb 3.2 oz), SpO2 93 %.  PHYSICAL EXAMINATION:  GENERAL:  82 y.o.-year-old patient lying in the bed with no acute distress.  EYES: Pupils equal, round, reactive to light and accommodation. No scleral icterus. Extraocular muscles intact.  HEENT: Head atraumatic, normocephalic. Oropharynx and nasopharynx clear.  NECK:  Supple, no jugular venous distention. No thyroid enlargement, no tenderness.  LUNGS: Diminished breath sounds bilaterally at bases, Better air entrance on the right posteriorly, still diminished on the left no wheezing, rales,rhonchi or crepitation. No use of accessory muscles of respiration.  CARDIOVASCULAR: S1, S2 , irregularly irregular. No murmurs, rubs, or gallops.  ABDOMEN: Soft, nontender, nondistended. Bowel sounds present. No organomegaly or mass.  EXTREMITIES: 1+ pedal edema,mostly around the ankles and lower one third of shin, feet,  no cyanosis, or clubbing.  NEUROLOGIC: Cranial nerves II through XII are intact. Muscle strength 5/5 in all extremities. Sensation intact. Gait not checked.  PSYCHIATRIC: The patient is alert and oriented x 3.  SKIN: No obvious rash, lesion, or ulcer.    LABORATORY PANEL:   CBC  Recent Labs Lab 08/01/15 0322 08/04/15 0558  WBC 5.6  --   HGB 10.9* 11.3*  HCT 33.4*  --   PLT 168  --    ------------------------------------------------------------------------------------------------------------------  Chemistries   Recent Labs Lab 07/31/15 1432  08/02/15 0216  08/04/15 0558  NA 134*  < > 135  < > 135  K 4.1  < > 4.0  < > 4.5  CL 96*  < >  92*  < > 94*  CO2 29  < > 36*  < > 35*  GLUCOSE 127*  < > 93  < > 87  BUN 40*  < > 35*  < > 28*  CREATININE 2.00*  < > 1.68*  < > 1.53*  CALCIUM 8.7*  < > 8.5*  < > 8.6*  MG  --   < > 2.3  --   --   AST 26  --   --   --   --   ALT 14  --   --   --   --   ALKPHOS 84  --    --   --   --   BILITOT 1.2  --   --   --   --   < > = values in this interval not displayed. ------------------------------------------------------------------------------------------------------------------  Cardiac Enzymes  Recent Labs Lab 07/31/15 0049  TROPONINI 0.05*   ------------------------------------------------------------------------------------------------------------------  RADIOLOGY:  No results found.  EKG:   Orders placed or performed during the hospital encounter of 07/31/15  . ED EKG  . ED EKG  . EKG 12-Lead  . EKG 12-Lead  . EKG 12-Lead  . EKG 12-Lead    ASSESSMENT AND PLAN:   This is an 82 year old female admitted for acute exacerbation of systolic heart failure and hypotension. 1. CHF: Acute on chronic; systolic. NYHA stage IV. AICD in place. Last ejection fraction reportedly 15-20% area The patient received Lasix 10 mg IV in the emergency department due to hypotension.  Patient is off Lasix [employer].it out put 3.7 lit since admission.  Dopamine was discontinued. The patient is to be continInitiated on Lasix orally at 40 mg daily dose, unless advancement as recommended by cardiologist/nephrologist  Appreciate cardio and nephro recs  2. Hypotension:The patient was managed on dopamine, which was changed to  dobutamine low-dose infusionwhile in the hospital, now discontinued.  The patient is stable and asymptomatic.   3. Acute on chronic kidney failure: Secondary to aggressive diuretic therapy.  Appreciate nephro to help guide our diuretic therapy. Function has improved, creatinine is 1.53 today, about stable from yesterday, changing diuretics to oral, starting tomorrow after discharge home tomorrow, discussed this patient and family  4. Atrial fibrillation: Rate controlled with metoprolol and dronedarone  , INR was  initially subtherapeutic. warfarin consult per pharmacy, INR is 2.39 Today. May need to hold the metoprolol intermittently due to hypotension.  Patient is on digoxin now, check digoxin level tomorrow morning, followed closely as outpatient as well since patient has chronic renal insufficiency, plus she is on Dronedarone  5. Coronary artery disease: Stable; continue aspirin and statin therapy for secondary prevention  6. DVT prophylaxis: Full dose anticoagulation as above  7. GI prophylaxis: PPI per home regimen  8. Lower extremity swelling, venous insufficiency, CHF related, compression stockings was suggested in addition to medications   All the records are reviewed and case discussed with Care Management/Social Workerr. Management plans discussed with the patient, family and they are in agreement.  CODE STATUS: FC  TOTAL CARE taking care of this patient  40 minutes Discussed this patient's Son, all questions were answered   POSSIBLE D/C IN ?DAYS, DEPENDING ON CLINICAL CONDITION.   Theodoro Grist M.D on 08/04/2015 at 2:23 PM  Between 7am to 6pm - Pager - [phone] After 6pm go to [email or web address] - password [email or web address] [facility]  Office  [phone]  CC: Primary care physician; Kirk Ruths., MD

## 2015-08-04 NOTE — Progress Notes (Signed)
[facility]  ROUNDING NOTE   Subjective:  Renal function relatively stable at the moment. Next line creatinine currently 1.5. Urine output was 950 cc over the preceding 24 hours. Patient's weight continues to decline.  Objective:  Vital signs in last 24 hours:  Temp:  [97.4 F (36.3 C)-99.4 F (37.4 C)] 97.4 F (36.3 C) (05/28 1138) Pulse Rate:  [69-73] 70 (05/28 1138) Resp:  [16-18] 18 (05/28 1138) BP: (83-97)/(42-49) 92/49 mmHg (05/28 1138) SpO2:  [90 %-93 %] 93 % (05/28 1138) Weight:  [37.739 kg (83 lb 3.2 oz)] 37.739 kg (83 lb 3.2 oz) (05/28 0333)  Weight change: -1.179 kg (-2 lb 9.6 oz) Filed Weights   07/31/15 0423 08/03/15 0418 08/04/15 0333  Weight: 47.3 kg (104 lb 4.4 oz) 38.919 kg (85 lb 12.8 oz) 37.739 kg (83 lb 3.2 oz)    Intake/Output: I/O last 3 completed shifts: In: 480 [P.O.:480] Out: 1700 [Urine:1700]   Intake/Output this shift:  Total I/O In: 480 [P.O.:480] Out: 400 [Urine:400]  Physical Exam: General: NAD  Head: Normocephalic, atraumatic. Moist oral mucosal membranes  Eyes: Anicteric  Neck: Supple, trachea midline  Lungs:  Minimal basilar rales  Heart: S1S2 irregular no rubs  Abdomen:  Soft, nontender, BS present  Extremities: trace peripheral edema.  Neurologic: Nonfocal, moving all four extremities  Skin: No lesions       Basic Metabolic Panel:  Recent Labs Lab 07/31/15 1432 08/01/15 0322 08/02/15 0216 08/03/15 0545 08/04/15 0558  NA 134* 135 135 135 135  K 4.1 3.6 4.0 4.5 4.5  CL 96* 95* 92* 93* 94*  CO2 29 33* 36* 37* 35*  GLUCOSE 127* 91 93 90 87  BUN 40* 38* 35* 29* 28*  CREATININE 2.00* 2.04* 1.68* 1.49* 1.53*  CALCIUM 8.7* 8.5* 8.5* 8.4* 8.6*  MG  --  1.9 2.3  --   --     Liver Function Tests:  Recent Labs Lab 07/31/15 0049 07/31/15 1432  AST  --  26  ALT  --  14  ALKPHOS  --  84  BILITOT  --  1.2  PROT  --  6.0*  ALBUMIN 3.3* 2.9*   No results for input(s): LIPASE, AMYLASE in the last 168  hours. No results for input(s): AMMONIA in the last 168 hours.  CBC:  Recent Labs Lab 07/31/15 0049 08/01/15 0322 08/04/15 0558  WBC 6.4 5.6  --   NEUTROABS  --  4.2  --   HGB 12.0 10.9* 11.3*  HCT 36.6 33.4*  --   MCV 83.1 83.2  --   PLT 205 168  --     Cardiac Enzymes:  Recent Labs Lab 07/31/15 0049  TROPONINI 0.05*    BNP: Invalid input(s): POCBNP  CBG:  Recent Labs Lab 07/31/15 0423  GLUCAP 27    Microbiology: Results for orders placed or performed during the hospital encounter of 07/31/15  MRSA PCR Screening     Status: None   Collection Time: 07/31/15  4:30 AM  Result Value Ref Range Status   MRSA by PCR NEGATIVE NEGATIVE Final    Comment:        The GeneXpert MRSA Assay (FDA approved for NASAL specimens only), is one component of a comprehensive MRSA colonization surveillance program. It is not intended to diagnose MRSA infection nor to guide or monitor treatment for MRSA infections.     Coagulation Studies:  Recent Labs  08/02/15 0216 08/03/15 0545 08/04/15 0558  LABPROT 22.7* 25.6* 25.8*  INR 2.02  2.37 2.39    Urinalysis: No results for input(s): COLORURINE, LABSPEC, PHURINE, GLUCOSEU, HGBUR, BILIRUBINUR, KETONESUR, PROTEINUR, UROBILINOGEN, NITRITE, LEUKOCYTESUR in the last 72 hours.  Invalid input(s): APPERANCEUR    Imaging: No results found.   Medications:     . antiseptic oral rinse  7 mL Mouth Rinse BID  . aspirin EC  81 mg Oral Daily  . atorvastatin  20 mg Oral QHS  . calcium-vitamin D  1 tablet Oral Daily  . cholecalciferol  2,000 Units Oral Daily  . digoxin  0.125 mg Oral Daily  . docusate sodium  100 mg Oral BID  . dronedarone  400 mg Oral BID WC  . [START ON 08/05/2015] furosemide  40 mg Oral Daily  . gabapentin  100 mg Oral BID  . metoprolol succinate  25 mg Oral Daily  . pantoprazole  40 mg Oral QAC breakfast  . polyethylene glycol  17 g Oral Daily  . potassium chloride  30 mEq Oral BID  . senna  1 tablet  Oral QHS  . sodium chloride flush  3 mL Intravenous Q12H  . spironolactone  12.5 mg Oral Daily  . vitamin B-12  1,000 mcg Oral Daily  . warfarin  3 mg Oral q1800  . Warfarin - Pharmacist Dosing Inpatient   Does not apply q1800   acetaminophen **OR** acetaminophen, ondansetron **OR** ondansetron (ZOFRAN) IV, sodium chloride  Assessment/ Plan:  82 y.o. female with a PMHx of chronic systolic heart faiulre EF 15%, atrial fibrillation, coronary artery disease, CKD stage III baseline Cr 1.03 with egfr of 56, pulmonary hypertension, coronary artery disease s/p CABG, history of pacemaker placement who presents now with shortness of breath due to heart failure exacerbation.  1.  Acute renal failure/chronic kidney disease stage III Baseline creatinine 1.03 with EGFR 56. It appears that the patient has had another episode of acute renal failure likely secondary to altered cardiorenal hemodynamics. She has had weight gain and increasing lower extremity edema. - Renal function stable at the moment. BUN currently 28 with a creatinine of 1.5. Continue diuresis.  Agree with switching IV Lasix to by mouth.  2. Acute systolic heart failure exacerbation. Weight continues to decline. Agree with switch to by mouth Lasix.  3. Hypotension. Blood pressure has remained relatively low. Currently blood pressure is 92/49. Continue to monitor closely.    LOS: 4 Lizzette Carbonell 5/28/20174:20 PM

## 2015-08-04 NOTE — Progress Notes (Signed)
ANTICOAGULATION CONSULT NOTE - Follow Up Consult  Pharmacy Consult for warfarin Indication: atrial fibrillation  Allergies  Allergen Reactions  . Biaxin [Clarithromycin] Other (See Comments)    Reaction:  Numbness of lips   . Flagyl [Metronidazole] Other (See Comments)    Reaction:  Numbness of lips   . Penicillins Rash and Other (See Comments)    Pt is unable to answer additional questions about this medication because it happened so long ago.  . Tetracyclines & Related Rash    Patient Measurements: Height: 5\' 4"  (162.6 cm) Weight: 83 lb 3.2 oz (37.739 kg) IBW/kg (Calculated) : 54.7  Vital Signs: Temp: 98 F (36.7 C) (05/28 0412) Temp Source: Oral (05/28 0412) BP: 94/44 mmHg (05/28 0816) Pulse Rate: 69 (05/28 0816)  Labs:  Recent Labs  08/02/15 0216 08/03/15 0545 08/04/15 0558  HGB  --   --  11.3*  LABPROT 22.7* 25.6* 25.8*  INR 2.02 2.37 2.39  CREATININE 1.68* 1.49* 1.53*    Estimated Creatinine Clearance: 17.2 mL/min (by C-G formula based on Cr of 1.53).   Medical History: Past Medical History  Diagnosis Date  . Atrial fibrillation ([place])   . CHF (congestive heart failure) ([place])   . Shingles   . CAD (coronary artery disease)   . Pulmonary hypertension ([place])   . GERD (gastroesophageal reflux disease)   . CKD (chronic kidney disease)     Assessment:  Pharmacy consulted for warfarin dosing for 82 yo female on warfarin as an outpatient for atrial fibrillation, goal INR 2-3. PTA lists warfarin 5 mg po three times a week and 2.5 mg po four times a week for 25 mg weekly.   Goal of Therapy:  INR 2-3 Monitor platelets by anticoagulation protocol: Yes   Plan:  INR 2.39 this morning. Continue current dose. Will recheck INR with AM labs.  Roe Coombs, PharmD Pharmacy Resident 08/04/2015

## 2015-08-05 DIAGNOSIS — N189 Chronic kidney disease, unspecified: Secondary | ICD-10-CM

## 2015-08-05 DIAGNOSIS — M7989 Other specified soft tissue disorders: Secondary | ICD-10-CM

## 2015-08-05 DIAGNOSIS — I4891 Unspecified atrial fibrillation: Secondary | ICD-10-CM

## 2015-08-05 DIAGNOSIS — I272 Pulmonary hypertension, unspecified: Secondary | ICD-10-CM

## 2015-08-05 DIAGNOSIS — N179 Acute kidney failure, unspecified: Secondary | ICD-10-CM

## 2015-08-05 LAB — BASIC METABOLIC PANEL
ANION GAP: 5 (ref 5–15)
BUN: 29 mg/dL — AB (ref 6–20)
CALCIUM: 8.5 mg/dL — AB (ref 8.9–10.3)
CO2: 34 mmol/L — ABNORMAL HIGH (ref 22–32)
Chloride: 95 mmol/L — ABNORMAL LOW (ref 101–111)
Creatinine, Ser: 1.43 mg/dL — ABNORMAL HIGH (ref 0.44–1.00)
GFR calc Af Amer: 39 mL/min — ABNORMAL LOW (ref >60)
GFR, EST NON AFRICAN AMERICAN: 33 mL/min — AB (ref >60)
Glucose, Bld: 87 mg/dL (ref 65–99)
POTASSIUM: 4.5 mmol/L (ref 3.5–5.1)
SODIUM: 134 mmol/L — AB (ref 135–145)

## 2015-08-05 LAB — PROTIME-INR
INR: 2.28
Prothrombin Time: 24.9 seconds — ABNORMAL HIGH (ref 11.4–15.0)

## 2015-08-05 LAB — DIGOXIN LEVEL: DIGOXIN LVL: 2 ng/mL (ref 0.8–2.0)

## 2015-08-05 MED ORDER — SENNA 8.6 MG PO TABS: 1.0000 | ORAL_TABLET | Freq: Every day | ORAL | Status: DC

## 2015-08-05 MED ORDER — DRONEDARONE HCL 400 MG PO TABS: 400.0000 mg | ORAL_TABLET | Freq: Every morning | ORAL | Status: DC

## 2015-08-05 MED ORDER — DIGOXIN 125 MCG PO TABS: 0.1250 mg | ORAL_TABLET | Freq: Every day | ORAL | Status: DC

## 2015-08-05 MED ORDER — POLYETHYLENE GLYCOL 3350 17 G PO PACK: 17.0000 g | PACK | Freq: Every day | ORAL | Status: DC

## 2015-08-05 NOTE — Care Management Important Message (Signed)
Important Message  Patient Details  Name: Sheryl Hughes MRN: [id number] Date of Birth: [birth year]   Medicare Important Message Given:  Yes    [name] A Sheryl Hughes 08/05/2015, 2:14 PM

## 2015-08-05 NOTE — Progress Notes (Addendum)
ANTICOAGULATION CONSULT NOTE - Follow Up Consult  Pharmacy Consult for warfarin Indication: atrial fibrillation  Allergies  Allergen Reactions  . Biaxin [Clarithromycin] Other (See Comments)    Reaction:  Numbness of lips   . Flagyl [Metronidazole] Other (See Comments)    Reaction:  Numbness of lips   . Penicillins Rash and Other (See Comments)    Pt is unable to answer additional questions about this medication because it happened so long ago.  . Tetracyclines & Related Rash    Patient Measurements: Height: 5\' 4"  (162.6 cm) Weight: 81 lb 3.2 oz (36.832 kg) IBW/kg (Calculated) : 54.7  Vital Signs: Temp: 98 F (36.7 C) (05/29 0806) Temp Source: Oral (05/29 0806) BP: 127/70 mmHg (05/29 0806) Pulse Rate: 74 (05/29 0806)  Labs:  Recent Labs  08/03/15 0545 08/04/15 0558 08/05/15 0500 08/05/15 0522  HGB  --  11.3*  --   --   LABPROT 25.6* 25.8* 24.9*  --   INR 2.37 2.39 2.28  --   CREATININE 1.49* 1.53*  --  1.43*    Estimated Creatinine Clearance: 17.9 mL/min (by C-G formula based on Cr of 1.43).   Medical History: Past Medical History  Diagnosis Date  . Atrial fibrillation ([place])   . CHF (congestive heart failure) ([place])   . Shingles   . CAD (coronary artery disease)   . Pulmonary hypertension ([place])   . GERD (gastroesophageal reflux disease)   . CKD (chronic kidney disease)     Assessment:  Pharmacy consulted for warfarin dosing for 82 yo female on warfarin as an outpatient for atrial fibrillation, goal INR 2-3. PTA lists warfarin 5 mg po three times a week and 2.5 mg po four times a week for 25 mg weekly.   Goal of Therapy:  INR 2-3 Monitor platelets by anticoagulation protocol: Yes   Plan:  INR 2.28 this morning. Continue current dose of warfarin 3mg  daily. Will recheck INR with AM labs.  Roe Coombs, PharmD Pharmacy Resident 08/05/2015

## 2015-08-05 NOTE — Discharge Instructions (Signed)
Heart Failure Clinic appointment on August 16, 2015 at 10:00am with Darylene Price, [place]. Please call [phone] to reschedule.

## 2015-08-05 NOTE — Discharge Summary (Addendum)
[facility] [place] NAME: Sheryl Hughes    MR#:  [id number]  DATE OF BIRTH:  15-Oct-1931  DATE OF ADMISSION:  07/31/2015 ADMITTING PHYSICIAN: Harrie Foreman, MD  DATE OF DISCHARGE: 08/05/2015  3:44 PM  PRIMARY CARE PHYSICIAN: Kirk Ruths., MD     ADMISSION DIAGNOSIS:  Acute on chronic diastolic CHF (congestive heart failure) (HCC) [I50.33]  DISCHARGE DIAGNOSIS:  Active Problems:   Acute on chronic systolic CHF (congestive heart failure) (HCC)   Atrial fibrillation (HCC)   Hypotension   Acute on chronic renal failure (HCC)   Leg swelling   Pulmonary hypertension ([place])   SECONDARY DIAGNOSIS:   Past Medical History  Diagnosis Date  . Atrial fibrillation ([place])   . CHF (congestive heart failure) ([place])   . Shingles   . CAD (coronary artery disease)   . Pulmonary hypertension ([place])   . GERD (gastroesophageal reflux disease)   . CKD (chronic kidney disease)     .pro HOSPITAL COURSE:   The patient is 82 year old female with PMH significant for chronic systolic CHF, EF of 123456, CAD, moderate TR, aortic insufficieny, mitral valve repair, recurrent pleural effusions, PPM placement 2013 who presented with weight gain and  Dyspnea. Chest x-ray in the emergency room revealed small bilateral pleural effusions with bibasilar airspace opacities consistent with possible pulmonary edema. She was noted to have acute on chronic renal insufficiency with a serum creatinine of 2.39 with a GFR of 18 mL/m. 80 years ago her serum creatinine was 1.2 with a GFR of 42 mL/m. She ruled out for myocardiac infarction. She was noted to be relatively hypotensive and was placed on a dopamine drip which was complicated by rapid ventricular response. This was discontinued and she was placed on a Lasix drip as well as dobutamine. She was seen by cardiologist and  Nephrologist while in the hospital. With conservative therapy she improved and was felt to be  stable to be discharged home. She diuresed about 3.7 liters since admission. Kidney function improved as well to an estimated GFR of 33.  She was recommended to continue amiodarone for better heart rate control and digoxin, following digoxin level intermittently as outpatient. She is to continue all other outpatient medications. Discussion by problem: 1. CHF: Acute on chronic; systolic. AICD in place. Last ejection fraction reportedly 15-20%. Patient was managed on Lasix and Dopamine drip initially, then changed to IV intermittently. Her net output was 3.7 lit since admission.  The patient is to be continued on home metolazone and Lasix doses, spironolactone. It is recommended to check potassium levels frequently as patient's potassium dose has changed. Appreciate cardio and nephro recs  2. Hypotension:The patient was managed on dopamine, which was changed to dobutamine low-dose infusion while in the hospital, now discontinued. The patient is stable and asymptomatic. She is to resume home metoprolol doses.   3. Acute on chronic kidney failure: Secondary to aggressive diuretic therapy.  Appreciate nephro to help guide our diuretic therapy. Function has improved, creatinine is 1.43 on the day of discharge,  restart home doses upon discharge home , discussed this patient and family  4. Atrial fibrillation: Rate controlled with metoprolol and dronedarone , INR was initially subtherapeutic. warfarin consult per pharmacy, INR is 2.28 on the day of discharge. Patient is on digoxin now, follow digoxin level  closely as outpatient  since patient has chronic renal insufficiency, plus she is on Dronedarone  5. Coronary artery disease: Stable; continue aspirin,  metoprolol and statin therapy for secondary prevention  6. Lower extremity swelling, venous insufficiency, CHF related, compression stockings were suggested in addition to medications DISCHARGE CONDITIONS:   stable  CONSULTS OBTAINED:  Treatment  Team:  Anthonette Legato, MD Teodoro Spray, MD Nicholes Mango, MD Isaias Cowman, MD  DRUG ALLERGIES:   Allergies  Allergen Reactions  . Biaxin [Clarithromycin] Other (See Comments)    Reaction:  Numbness of lips   . Flagyl [Metronidazole] Other (See Comments)    Reaction:  Numbness of lips   . Penicillins Rash and Other (See Comments)    Pt is unable to answer additional questions about this medication because it happened so long ago.  . Tetracyclines & Related Rash    DISCHARGE MEDICATIONS:   Discharge Medication List as of 08/05/2015  3:08 PM    START taking these medications   Details  digoxin (LANOXIN) 0.125 MG tablet Take 1 tablet (0.125 mg total) by mouth daily., Starting 08/05/2015, Until Discontinued, Normal    polyethylene glycol (MIRALAX / GLYCOLAX) packet Take 17 g by mouth daily., Starting 08/05/2015, Until Discontinued, Normal    senna (SENOKOT) 8.6 MG TABS tablet Take 1 tablet (8.6 mg total) by mouth at bedtime., Starting 08/05/2015, Until Discontinued, Normal      CONTINUE these medications which have CHANGED   Details  dronedarone (MULTAQ) 400 MG tablet Take 1 tablet (400 mg total) by mouth every morning., Starting 08/06/2015, Until Discontinued, Normal      CONTINUE these medications which have NOT CHANGED   Details  aspirin EC 81 MG tablet Take 81 mg by mouth daily., Until Discontinued, Historical Med    atorvastatin (LIPITOR) 20 MG tablet Take 20 mg by mouth at bedtime. , Until Discontinued, Historical Med    Calcium Carb-Cholecalciferol (CALCIUM 600 + D PO) Take 1 tablet by mouth daily., Until Discontinued, Historical Med    Cholecalciferol (VITAMIN D) 2000 UNITS CAPS Take 2,000 Units by mouth daily., Until Discontinued, Historical Med    docusate sodium (COLACE) 100 MG capsule Take 100 mg by mouth 2 (two) times daily., Until Discontinued, Historical Med    furosemide (LASIX) 40 MG tablet Take 40 mg by mouth daily. , Until Discontinued, Historical Med     gabapentin (NEURONTIN) 100 MG capsule Take 100 mg by mouth 2 (two) times daily., Until Discontinued, Historical Med    metolazone (ZAROXOLYN) 2.5 MG tablet Take 2.5 mg by mouth every Monday, Wednesday, and Friday., Until Discontinued, Historical Med    metoprolol succinate (TOPROL-XL) 25 MG 24 hr tablet Take 25 mg by mouth daily. , Until Discontinued, Historical Med    pantoprazole (PROTONIX) 40 MG tablet Take 40 mg by mouth daily. , Until Discontinued, Historical Med    potassium chloride (K-DUR) 10 MEQ tablet Take 30 mEq by mouth 2 (two) times daily. , Until Discontinued, Historical Med    spironolactone (ALDACTONE) 25 MG tablet Take 12.5 mg by mouth daily., Until Discontinued, Historical Med    vitamin B-12 (CYANOCOBALAMIN) 1000 MCG tablet Take 1,000 mcg by mouth daily., Until Discontinued, Historical Med    warfarin (COUMADIN) 5 MG tablet Take 5 mg by mouth daily. Take 1 tablet three times weekly and 1/2 a tablet four times weekly., Until Discontinued, Historical Med      STOP taking these medications     ferrous sulfate 325 (65 FE) MG tablet      oxyCODONE-acetaminophen (PERCOCET) 5-325 MG tablet          DISCHARGE INSTRUCTIONS:  Follow up with PCP, cardiology, nephrology  If you experience worsening of your admission symptoms, develop shortness of breath, life threatening emergency, suicidal or homicidal thoughts you must seek medical attention immediately by calling 911 or calling your MD immediately  if symptoms less severe.  You Must read complete instructions/literature along with all the possible adverse reactions/side effects for all the Medicines you take and that have been prescribed to you. Take any new Medicines after you have completely understood and accept all the possible adverse reactions/side effects.   Please note  You were cared for by a hospitalist during your hospital stay. If you have any questions about your discharge medications or the care  you received while you were in the hospital after you are discharged, you can call the unit and asked to speak with the hospitalist on call if the hospitalist that took care of you is not available. Once you are discharged, your primary care physician will handle any further medical issues. Please note that NO REFILLS for any discharge medications will be authorized once you are discharged, as it is imperative that you return to your primary care physician (or establish a relationship with a primary care physician if you do not have one) for your aftercare needs so that they can reassess your need for medications and monitor your lab values.    Today   CHIEF COMPLAINT:   Chief Complaint  Patient presents with  . Shortness of Breath    HISTORY OF PRESENT ILLNESS:  Licet Ebnet  is a 82 y.o. female with a known history of chronic systolic CHF, EF of 123456, CAD, moderate TR, aortic insufficieny, mitral valve repair, recurrent pleural effusions, PPM placement 2013 who presented with weight gain and  Dyspnea. Chest x-ray in the emergency room revealed small bilateral pleural effusions with bibasilar airspace opacities consistent with possible pulmonary edema. She was noted to have acute on chronic renal insufficiency with a serum creatinine of 2.39 with a GFR of 18 mL/m. 80 years ago her serum creatinine was 1.2 with a GFR of 42 mL/m. She ruled out for myocardiac infarction. She was noted to be relatively hypotensive and was placed on a dopamine drip which was complicated by rapid ventricular response. This was discontinued and she was placed on a Lasix drip as well as dobutamine. She was seen by cardiologist and  Nephrologist while in the hospital. With conservative therapy she improved and was felt to be stable to be discharged home. She diuresed about 3.7 liters since admission. Kidney function improved as well to an estimated GFR of 33.  She was recommended to continue amiodarone for better heart rate  control and digoxin, following digoxin level intermittently as outpatient. She is to continue all other outpatient medications. Discussion by problem: 1. CHF: Acute on chronic; systolic. AICD in place. Last ejection fraction reportedly 15-20%. Patient was managed on Lasix and Dopamine drip initially, then changed to IV intermittently. Her net output was 3.7 lit since admission.  The patient is to be continued on home metolazone and Lasix doses, spironolactone. It is recommended to check potassium levels frequently as patient's potassium dose has changed. It is recommended to follow patient's digoxin levels closely since patient is on amiodarone, also has chronic renal insufficiency. Appreciate cardio and nephro recs. Since digoxin level was 2.0 on the day of discharge, which age digoxin dose to 125 g every second day. Digoxin level should be checked by Dr. Elwyn Reach office or primary care physician office as soon as  possible to ensure stability.   2. Hypotension:The patient was managed on dopamine, which was changed to dobutamine low-dose infusion while in the hospital, now discontinued. The patient is stable and asymptomatic. She is to resume home metoprolol doses.   3. Acute on chronic kidney failure: Secondary to aggressive diuretic therapy.  Appreciate nephro to help guide our diuretic therapy. Function has improved, creatinine is 1.43 on the day of discharge,  restart home doses upon discharge home , discussed this patient and family  4. Atrial fibrillation: Rate controlled with metoprolol and dronedarone , INR was initially subtherapeutic. warfarin consult per pharmacy, INR is 2.28 on the day of discharge. Patient is on digoxin now, follow digoxin level  closely as outpatient  since patient has chronic renal insufficiency, plus she is on Dronedarone  5. Coronary artery disease: Stable; continue aspirin, metoprolol and statin therapy for secondary prevention  6. Lower extremity swelling,  venous insufficiency, CHF related, compression stockings were suggested in addition to medications    VITAL SIGNS:  Blood pressure 101/77, pulse 70, temperature 97.6 F (36.4 C), temperature source Oral, resp. rate 19, height 5\' 4"  (1.626 m), weight 36.832 kg (81 lb 3.2 oz), SpO2 97 %.  I/O:   Intake/Output Summary (Last 24 hours) at 08/05/15 2030 Last data filed at 08/05/15 1400  Gross per 24 hour  Intake    366 ml  Output    750 ml  Net   -384 ml    PHYSICAL EXAMINATION:  GENERAL:  82 y.o.-year-old patient lying in the bed with no acute distress.  EYES: Pupils equal, round, reactive to light and accommodation. No scleral icterus. Extraocular muscles intact.  HEENT: Head atraumatic, normocephalic. Oropharynx and nasopharynx clear.  NECK:  Supple, no jugular venous distention. No thyroid enlargement, no tenderness.  LUNGS: Normal breath sounds bilaterally, no wheezing, rales,rhonchi or crepitation. No use of accessory muscles of respiration.  CARDIOVASCULAR: S1, S2 normal. No murmurs, rubs, or gallops.  ABDOMEN: Soft, non-tender, non-distended. Bowel sounds present. No organomegaly or mass.  EXTREMITIES: No pedal edema, cyanosis, or clubbing.  NEUROLOGIC: Cranial nerves II through XII are intact. Muscle strength 5/5 in all extremities. Sensation intact. Gait not checked.  PSYCHIATRIC: The patient is alert and oriented x 3.  SKIN: No obvious rash, lesion, or ulcer.   DATA REVIEW:   CBC  Recent Labs Lab 08/01/15 0322 08/04/15 0558  WBC 5.6  --   HGB 10.9* 11.3*  HCT 33.4*  --   PLT 168  --     Chemistries   Recent Labs Lab 07/31/15 1432  08/02/15 0216  08/05/15 0522  NA 134*  < > 135  < > 134*  K 4.1  < > 4.0  < > 4.5  CL 96*  < > 92*  < > 95*  CO2 29  < > 36*  < > 34*  GLUCOSE 127*  < > 93  < > 87  BUN 40*  < > 35*  < > 29*  CREATININE 2.00*  < > 1.68*  < > 1.43*  CALCIUM 8.7*  < > 8.5*  < > 8.5*  MG  --   < > 2.3  --   --   AST 26  --   --   --   --   ALT  14  --   --   --   --   ALKPHOS 84  --   --   --   --   BILITOT 1.2  --   --   --   --   < > =  values in this interval not displayed.  Cardiac Enzymes  Recent Labs Lab 07/31/15 0049  TROPONINI 0.05*    Microbiology Results  Results for orders placed or performed during the hospital encounter of 07/31/15  MRSA PCR Screening     Status: None   Collection Time: 07/31/15  4:30 AM  Result Value Ref Range Status   MRSA by PCR NEGATIVE NEGATIVE Final    Comment:        The GeneXpert MRSA Assay (FDA approved for NASAL specimens only), is one component of a comprehensive MRSA colonization surveillance program. It is not intended to diagnose MRSA infection nor to guide or monitor treatment for MRSA infections.     RADIOLOGY:  No results found.  EKG:   Orders placed or performed during the hospital encounter of 07/31/15  . ED EKG  . ED EKG  . EKG 12-Lead  . EKG 12-Lead  . EKG 12-Lead  . EKG 12-Lead      Management plans discussed with the patient, family and they are in agreement.  CODE STATUS:  Code Status History    Date Active Date Inactive Code Status Order ID Comments User Context   07/31/2015  4:19 AM 08/05/2015  6:44 PM Full Code [id number]  Harrie Foreman, MD Inpatient   12/05/2014  4:23 PM 12/09/2014  4:42 PM Full Code [id number]  Theodoro Grist, MD Inpatient   10/21/2014  6:19 PM 10/25/2014  7:49 PM Full Code [id number]  Demetrios Loll, MD Inpatient   10/17/2014  2:44 AM 10/17/2014  5:35 PM Full Code [id number]  Lance Coon, MD Inpatient    Advance Directive Documentation        Most Recent Value   Type of Advance Directive  Healthcare Power of Attorney, Living will   Pre-existing out of facility DNR order (yellow form or pink MOST form)     "MOST" Form in Place?        TOTAL TIME TAKING CARE OF THIS PATIENT: 40 minutes.    Theodoro Grist M.D on 08/05/2015 at 8:30 PM  Between 7am to 6pm - Pager - [phone]  After 6pm go to [email or web address] - password [email or web address]  [facility]  Office  [phone]  CC: Primary care physician; Kirk Ruths., MD

## 2015-08-05 NOTE — Progress Notes (Signed)
Rt  IJ double lumen PICC removed, pressure held x 5 minutes, site without drainage or redness.  Vasoline guaze and tegaderm applied.

## 2015-08-05 NOTE — Progress Notes (Signed)
Pt discharged to home via wc.  Instructions  given to pt.  Questions answered.  No distress.  

## 2015-08-05 NOTE — Progress Notes (Signed)
Initial Heart Failure Clinic appointment scheduled on August 16, 2015 at 10:00am. Thank you.

## 2015-08-05 NOTE — Progress Notes (Signed)
[facility] Cardiology  SUBJECTIVE: I feel better   Filed Vitals:   08/04/15 1138 08/04/15 2015 08/05/15 0526 08/05/15 0806  BP: 92/49 112/60 95/50 127/70  Pulse: 70 74 69 74  Temp: 97.4 F (36.3 C) 98.4 F (36.9 C) 98 F (36.7 C) 98 F (36.7 C)  TempSrc:  Oral Oral Oral  Resp: 18 16 16 17   Height:      Weight:   36.832 kg (81 lb 3.2 oz)   SpO2: 93% 93% 94% 97%     Intake/Output Summary (Last 24 hours) at 08/05/15 1009 Last data filed at 08/05/15 1008  Gross per 24 hour  Intake    240 ml  Output   1750 ml  Net  -1510 ml      PHYSICAL EXAM  General: Well developed, well nourished, in no acute distress HEENT:  Normocephalic and atramatic Neck:  No JVD.  Lungs: Clear bilaterally to auscultation and percussion. Heart: HRRR . Normal S1 and S2 without gallops or murmurs.  Abdomen: Bowel sounds are positive, abdomen soft and non-tender  Msk:  Back normal, normal gait. Normal strength and tone for age. Extremities: No clubbing, cyanosis or edema.   Neuro: Alert and oriented X 3. Psych:  Good affect, responds appropriately   LABS: Basic Metabolic Panel:  Recent Labs  08/04/15 0558 08/05/15 0522  NA 135 134*  K 4.5 4.5  CL 94* 95*  CO2 35* 34*  GLUCOSE 87 87  BUN 28* 29*  CREATININE 1.53* 1.43*  CALCIUM 8.6* 8.5*   Liver Function Tests: No results for input(s): AST, ALT, ALKPHOS, BILITOT, PROT, ALBUMIN in the last 72 hours. No results for input(s): LIPASE, AMYLASE in the last 72 hours. CBC:  Recent Labs  08/04/15 0558  HGB 11.3*   Cardiac Enzymes: No results for input(s): CKTOTAL, CKMB, CKMBINDEX, TROPONINI in the last 72 hours. BNP: Invalid input(s): POCBNP D-Dimer: No results for input(s): DDIMER in the last 72 hours. Hemoglobin A1C: No results for input(s): HGBA1C in the last 72 hours. Fasting Lipid Panel: No results for input(s): CHOL, HDL, LDLCALC, TRIG, CHOLHDL, LDLDIRECT in the last 72 hours. Thyroid Function Tests: No results for input(s): TSH,  T4TOTAL, T3FREE, THYROIDAB in the last 72 hours.  Invalid input(s): FREET3 Anemia Panel: No results for input(s): VITAMINB12, FOLATE, FERRITIN, TIBC, IRON, RETICCTPCT in the last 72 hours.  No results found.   Echo   TELEMETRY: A sense V pace:  ASSESSMENT AND PLAN:  Active Problems:   Acute exacerbation of CHF (congestive heart failure) ([place])    1. Acute on chronic systolic congestive heart failure, improved after diuresis 2. Atrial fibrillation, converted to sinus rhythm, on warfarin for stroke prevention, on amiodarone for rhythm control 3. Chronic kidney disease, stable  Recommendations  1. Continue current medications 2. Continue diuresis, carefully monitor renal status 3. Continue warfarin for stroke prevention, target INR 2.0-3.0 4. Continue amiodarone for rhythm control, decrease to 400 mg daily   Catharine Kettlewell, MD, PhD, [facility] 08/05/2015 10:09 AM

## 2015-08-05 NOTE — Plan of Care (Signed)
Problem: Tissue Perfusion: Goal: Risk factors for ineffective tissue perfusion will decrease Outcome: Progressing Po coumadin

## 2015-08-05 NOTE — Care Management (Signed)
Cm assessment for medication needs. Met with patient at bedside. She denies issues obtaining medications and has prescription drug coverage. She had questions regarding meal preparation. Provided her with area agencies that may be able to help her. Discussed home health PT. Patient standing up at sink and bathing her self. She denies falls. Refused any home health services. Uses a cane and walker only rarely. Her spouse helps her with transportation. PCP is Dr. Anderson. She uses [facility] in [place]. No other needs identified.  

## 2015-08-16 ENCOUNTER — Telehealth: Payer: Self-pay | Admitting: Family

## 2015-08-16 ENCOUNTER — Encounter: Payer: Self-pay | Admitting: Family

## 2015-08-16 ENCOUNTER — Ambulatory Visit: Payer: Medicare Other | Attending: Family | Admitting: Family

## 2015-08-16 VITALS — BP 113/57 | HR 77 | Resp 18 | Ht 64.0 in | Wt 90.0 lb

## 2015-08-16 DIAGNOSIS — Z950 Presence of cardiac pacemaker: Secondary | ICD-10-CM | POA: Diagnosis not present

## 2015-08-16 DIAGNOSIS — R42 Dizziness and giddiness: Secondary | ICD-10-CM | POA: Insufficient documentation

## 2015-08-16 DIAGNOSIS — Z9010 Acquired absence of unspecified breast and nipple: Secondary | ICD-10-CM | POA: Insufficient documentation

## 2015-08-16 DIAGNOSIS — I959 Hypotension, unspecified: Secondary | ICD-10-CM | POA: Diagnosis not present

## 2015-08-16 DIAGNOSIS — I130 Hypertensive heart and chronic kidney disease with heart failure and stage 1 through stage 4 chronic kidney disease, or unspecified chronic kidney disease: Secondary | ICD-10-CM | POA: Diagnosis not present

## 2015-08-16 DIAGNOSIS — K5900 Constipation, unspecified: Secondary | ICD-10-CM | POA: Diagnosis not present

## 2015-08-16 DIAGNOSIS — Z951 Presence of aortocoronary bypass graft: Secondary | ICD-10-CM | POA: Diagnosis not present

## 2015-08-16 DIAGNOSIS — Z7901 Long term (current) use of anticoagulants: Secondary | ICD-10-CM | POA: Diagnosis not present

## 2015-08-16 DIAGNOSIS — Z880 Allergy status to penicillin: Secondary | ICD-10-CM | POA: Insufficient documentation

## 2015-08-16 DIAGNOSIS — I2510 Atherosclerotic heart disease of native coronary artery without angina pectoris: Secondary | ICD-10-CM | POA: Insufficient documentation

## 2015-08-16 DIAGNOSIS — N184 Chronic kidney disease, stage 4 (severe): Secondary | ICD-10-CM

## 2015-08-16 DIAGNOSIS — I4891 Unspecified atrial fibrillation: Secondary | ICD-10-CM | POA: Diagnosis not present

## 2015-08-16 DIAGNOSIS — I950 Idiopathic hypotension: Secondary | ICD-10-CM

## 2015-08-16 DIAGNOSIS — N189 Chronic kidney disease, unspecified: Secondary | ICD-10-CM | POA: Diagnosis not present

## 2015-08-16 DIAGNOSIS — Z881 Allergy status to other antibiotic agents status: Secondary | ICD-10-CM | POA: Insufficient documentation

## 2015-08-16 DIAGNOSIS — I5022 Chronic systolic (congestive) heart failure: Secondary | ICD-10-CM | POA: Diagnosis present

## 2015-08-16 DIAGNOSIS — Z7982 Long term (current) use of aspirin: Secondary | ICD-10-CM | POA: Diagnosis not present

## 2015-08-16 DIAGNOSIS — I272 Other secondary pulmonary hypertension: Secondary | ICD-10-CM | POA: Diagnosis not present

## 2015-08-16 DIAGNOSIS — K219 Gastro-esophageal reflux disease without esophagitis: Secondary | ICD-10-CM | POA: Insufficient documentation

## 2015-08-16 DIAGNOSIS — Z8249 Family history of ischemic heart disease and other diseases of the circulatory system: Secondary | ICD-10-CM | POA: Insufficient documentation

## 2015-08-16 DIAGNOSIS — R0602 Shortness of breath: Secondary | ICD-10-CM | POA: Diagnosis not present

## 2015-08-16 DIAGNOSIS — H538 Other visual disturbances: Secondary | ICD-10-CM | POA: Diagnosis not present

## 2015-08-16 DIAGNOSIS — I482 Chronic atrial fibrillation, unspecified: Secondary | ICD-10-CM

## 2015-08-16 LAB — BASIC METABOLIC PANEL
Anion gap: 8 (ref 5–15)
BUN: 25 mg/dL — AB (ref 6–20)
CALCIUM: 9.4 mg/dL (ref 8.9–10.3)
CO2: 31 mmol/L (ref 22–32)
Chloride: 98 mmol/L — ABNORMAL LOW (ref 101–111)
Creatinine, Ser: 1.47 mg/dL — ABNORMAL HIGH (ref 0.44–1.00)
GFR, EST AFRICAN AMERICAN: 37 mL/min — AB (ref >60)
GFR, EST NON AFRICAN AMERICAN: 32 mL/min — AB (ref >60)
GLUCOSE: 87 mg/dL (ref 65–99)
Potassium: 4.7 mmol/L (ref 3.5–5.1)
SODIUM: 137 mmol/L (ref 135–145)

## 2015-08-16 LAB — DIGOXIN LEVEL: DIGOXIN LVL: 0.8 ng/mL (ref 0.8–2.0)

## 2015-08-16 NOTE — Progress Notes (Signed)
Subjective:    Patient ID: Sheryl Hughes, female    DOB: 01/06/1932, 82 y.o.   MRN: [id number]  Congestive Heart Failure Presents for initial visit. The disease course has been improving. Associated symptoms include fatigue and shortness of breath. Pertinent negatives include no abdominal pain, chest pain, chest pressure, edema, orthopnea or palpitations. The symptoms have been improving. Past treatments include beta blockers, digoxin, salt and fluid restriction and aldosterone receptor blockers. The treatment provided significant relief. Compliance with prior treatments has been good. Her past medical history is significant for arrhythmia, CAD and valvular heart disease. There is no history of CVA or DM. She has multiple 1st degree relatives with heart disease.  Atrial Fibrillation Presents for initial visit. Symptoms include hypotension and shortness of breath. Symptoms are negative for chest pain, dizziness, palpitations and tachycardia. The symptoms have been stable. Past treatments include warfarin, beta blockers, antiarrhythmics and aspirin. Compliance with prior treatments has been good. Past medical history includes atrial fibrillation, CAD, CHF and valvular heart disease. There are no medication compliance problems.   Past Medical History  Diagnosis Date  . Atrial fibrillation ([place])   . CHF (congestive heart failure) ([place])   . Shingles   . CAD (coronary artery disease)   . Pulmonary hypertension ([place])   . GERD (gastroesophageal reflux disease)   . CKD (chronic kidney disease)     Past Surgical History  Procedure Laterality Date  . Mastectomy    . Cardiac catheterization    . Back surgery    . Eye surgery    . Thoracentesis Bilateral   . Pacemaker placement      defibrillator  . Mitral valve repair    . Coronary artery bypass graft    . Esophagogastroduodenoscopy (egd) with propofol N/A 12/08/2014    Procedure: ESOPHAGOGASTRODUODENOSCOPY (EGD) WITH PROPOFOL;  Surgeon: Lucilla Lame, MD;  Location: [facility] ENDOSCOPY;  Service: Endoscopy;  Laterality: N/A;  Look into stomach with scope.  . Colonoscopy with propofol N/A 12/08/2014    Procedure: COLONOSCOPY WITH PROPOFOL;  Surgeon: Lucilla Lame, MD;  Location: [facility] ENDOSCOPY;  Service: Endoscopy;  Laterality: N/A;  look into colon with scope    Family History  Problem Relation Age of Onset  . Heart attack Mother   . Heart attack Father   . COPD Father   . Ulcers Father   . Atrial fibrillation Sister   . Lung cancer Sister   . Heart attack Brother   . Aortic stenosis Brother   . CAD Brother     Social History  Substance Use Topics  . Smoking status: Never Smoker   . Smokeless tobacco: Never Used  . Alcohol Use: No    Allergies  Allergen Reactions  . Biaxin [Clarithromycin] Other (See Comments)    Reaction:  Numbness of lips   . Flagyl [Metronidazole] Other (See Comments)    Reaction:  Numbness of lips   . Penicillins Rash and Other (See Comments)    Pt is unable to answer additional questions about this medication because it happened so long ago.  . Tetracyclines & Related Rash    Prior to Admission medications   Medication Sig Start Date End Date Taking? Authorizing Provider  aspirin EC 81 MG tablet Take 81 mg by mouth daily.   Yes Historical Provider, MD  atorvastatin (LIPITOR) 20 MG tablet Take 20 mg by mouth at bedtime.    Yes Historical Provider, MD  Calcium Carb-Cholecalciferol (CALCIUM 600 + D PO) Take 1 tablet by  mouth daily.   Yes Historical Provider, MD  Cholecalciferol (VITAMIN D) 2000 UNITS CAPS Take 2,000 Units by mouth daily.   Yes Historical Provider, MD  digoxin (LANOXIN) 0.125 MG tablet Take 1 tablet (0.125 mg total) by mouth daily. 08/05/15  Yes Theodoro Grist, MD  docusate sodium (COLACE) 100 MG capsule Take 100 mg by mouth 2 (two) times daily.   Yes Historical Provider, MD  dronedarone (MULTAQ) 400 MG tablet Take 1 tablet (400 mg total) by mouth every morning. 08/06/15  Yes Theodoro Grist,  MD  furosemide (LASIX) 40 MG tablet Take 40 mg by mouth daily.    Yes Historical Provider, MD  gabapentin (NEURONTIN) 100 MG capsule Take 100 mg by mouth 3 (three) times daily.    Yes Historical Provider, MD  metolazone (ZAROXOLYN) 2.5 MG tablet Take 2.5 mg by mouth every Monday, Wednesday, and Friday.   Yes Historical Provider, MD  metoprolol succinate (TOPROL-XL) 25 MG 24 hr tablet Take 25 mg by mouth daily.    Yes Historical Provider, MD  pantoprazole (PROTONIX) 40 MG tablet Take 40 mg by mouth daily.    Yes Historical Provider, MD  polyethylene glycol (MIRALAX / GLYCOLAX) packet Take 17 g by mouth daily. 08/05/15  Yes Theodoro Grist, MD  potassium chloride (K-DUR) 10 MEQ tablet Take 30 mEq by mouth 2 (two) times daily.    Yes Historical Provider, MD  senna (SENOKOT) 8.6 MG TABS tablet Take 1 tablet (8.6 mg total) by mouth at bedtime. 08/05/15  Yes Theodoro Grist, MD  spironolactone (ALDACTONE) 25 MG tablet Take 12.5 mg by mouth daily.   Yes Historical Provider, MD  vitamin B-12 (CYANOCOBALAMIN) 1000 MCG tablet Take 1,000 mcg by mouth daily.   Yes Historical Provider, MD  warfarin (COUMADIN) 5 MG tablet Take 5 mg by mouth daily. Take 1 tablet three times weekly and 1/2 a tablet four times weekly.   Yes Historical Provider, MD      Review of Systems  Constitutional: Positive for fatigue. Negative for appetite change.  HENT: Negative for congestion, nosebleeds and postnasal drip.   Eyes: Positive for visual disturbance (blurry vision). Negative for pain.  Respiratory: Positive for cough and shortness of breath. Negative for chest tightness and wheezing.   Cardiovascular: Negative for chest pain, palpitations and leg swelling.  Gastrointestinal: Positive for constipation. Negative for abdominal pain and abdominal distention.  Endocrine: Negative.   Genitourinary: Negative.   Musculoskeletal: Negative for back pain and neck pain.  Skin: Negative.   Allergic/Immunologic: Negative.    Neurological: Positive for light-headedness. Negative for dizziness and headaches.  Hematological: Negative for adenopathy. Bruises/bleeds easily.  Psychiatric/Behavioral: Negative for sleep disturbance (sleeping on 1 pillow) and dysphoric mood. The patient is not nervous/anxious.        Objective:   Physical Exam  Constitutional: She is oriented to person, place, and time. She appears well-developed and well-nourished.  HENT:  Head: Normocephalic and atraumatic.  Eyes: Conjunctivae are normal. Pupils are equal, round, and reactive to light.  Neck: Normal range of motion. Neck supple.  Cardiovascular: Normal rate and regular rhythm.   Pulmonary/Chest: Effort normal. She has no wheezes. She has no rales.  Abdominal: Soft. She exhibits no distension. There is no tenderness.  Musculoskeletal: She exhibits no edema or tenderness.  Neurological: She is alert and oriented to person, place, and time.  Skin: Skin is warm and dry.  Psychiatric: She has a normal mood and affect. Her behavior is normal. Thought content normal.  Nursing note and  vitals reviewed.  BP 113/57 mmHg  Pulse 77  Resp 18  Ht 5\' 4"  (1.626 m)  Wt 90 lb (40.824 kg)  BMI 15.44 kg/m2  SpO2 100%        Assessment & Plan:  1: Chronic heart failure with reduced ejection fraction- Patient presents with fatigue and shortness of breath upon little exertion (Class III) which is relieved with rest. She was let out at the front door of the building and said she was a little tired upon walking into the office. She denies any swelling in her legs or abdomen. She is already weighing herself daily and says that her weight has been stable. Instructed to call for an overnight weight gain of >2 pounds or a weekly weight gain of >5 pounds. She is not adding any salt to her food and drains any canned vegetables that she uses. They do go out to eat a couple of times a week and is aware that she is getting more salt in those meals.  Discussed the importance of following a 2000mg  sodium diet and written information was given to her about this. [facility] PharmD went in and reviewed medications with the patient.  2: Atrial fibrillation- Currently rate controlled on digoxin, metoprolol and dronedarone. She also is taking warfarin daily. Has to call and schedule a follow-up appointment with her cardiologist. Encouraged her to speak with him about the use of dronedarone since she's been in the hospital with heart failure. Will check a digoxin level today.  3: Hypotension- Blood pressure on the low side but she says that it's always low. Light-headed at times but no falls. Encouraged her to change positions slowly. 4: Chronic kidney disease- Follows closely with nephrology. Will check a basic metabolic panel today.   Reviewed patient's medication list that she brought with her.  Return here in 1 month or sooner for any questions/problems before then.

## 2015-08-16 NOTE — Patient Instructions (Signed)
Continue weighing daily and call for an overnight weight gain of > 2 pounds or a weekly weight gain of >5 pounds. 

## 2015-08-16 NOTE — Telephone Encounter (Signed)
Spoke with patient and advised her of a normal digoxin, potassium and sodium levels. Renal function is stable from hospital discharge. Did tell patient that she should take her metolazone two days a week instead of three but if her weight rose, swelling started or her shortness of breath became worse, that she could go ahead and take the 3rd dose of metolazone. Patient verbalized understanding of instructions and was appreciative of the call. Will fax results to patient's nephrologist and cardiologist.

## 2015-08-21 LAB — ACID FAST CULTURE WITH REFLEXED SENSITIVITIES

## 2015-08-21 LAB — ACID FAST CULTURE WITH REFLEXED SENSITIVITIES (MYCOBACTERIA): Acid Fast Culture: NEGATIVE

## 2015-08-29 ENCOUNTER — Encounter: Payer: Self-pay | Admitting: *Deleted

## 2015-08-29 LAB — ACID FAST CULTURE WITH REFLEXED SENSITIVITIES (MYCOBACTERIA): Acid Fast Culture: NEGATIVE

## 2015-09-12 ENCOUNTER — Encounter: Payer: Self-pay | Admitting: *Deleted

## 2015-09-16 ENCOUNTER — Encounter: Payer: Self-pay | Admitting: Family

## 2015-09-16 ENCOUNTER — Ambulatory Visit: Payer: Medicare Other | Attending: Family | Admitting: Family

## 2015-09-16 VITALS — BP 115/54 | HR 75 | Resp 20 | Ht 64.0 in | Wt 87.0 lb

## 2015-09-16 DIAGNOSIS — I130 Hypertensive heart and chronic kidney disease with heart failure and stage 1 through stage 4 chronic kidney disease, or unspecified chronic kidney disease: Secondary | ICD-10-CM | POA: Insufficient documentation

## 2015-09-16 DIAGNOSIS — I950 Idiopathic hypotension: Secondary | ICD-10-CM

## 2015-09-16 DIAGNOSIS — Z8249 Family history of ischemic heart disease and other diseases of the circulatory system: Secondary | ICD-10-CM | POA: Insufficient documentation

## 2015-09-16 DIAGNOSIS — I272 Other secondary pulmonary hypertension: Secondary | ICD-10-CM | POA: Insufficient documentation

## 2015-09-16 DIAGNOSIS — N184 Chronic kidney disease, stage 4 (severe): Secondary | ICD-10-CM

## 2015-09-16 DIAGNOSIS — Z881 Allergy status to other antibiotic agents status: Secondary | ICD-10-CM | POA: Diagnosis not present

## 2015-09-16 DIAGNOSIS — Z955 Presence of coronary angioplasty implant and graft: Secondary | ICD-10-CM | POA: Diagnosis not present

## 2015-09-16 DIAGNOSIS — Z801 Family history of malignant neoplasm of trachea, bronchus and lung: Secondary | ICD-10-CM | POA: Diagnosis not present

## 2015-09-16 DIAGNOSIS — Z888 Allergy status to other drugs, medicaments and biological substances status: Secondary | ICD-10-CM | POA: Insufficient documentation

## 2015-09-16 DIAGNOSIS — I2510 Atherosclerotic heart disease of native coronary artery without angina pectoris: Secondary | ICD-10-CM | POA: Insufficient documentation

## 2015-09-16 DIAGNOSIS — Z836 Family history of other diseases of the respiratory system: Secondary | ICD-10-CM | POA: Diagnosis not present

## 2015-09-16 DIAGNOSIS — Z7982 Long term (current) use of aspirin: Secondary | ICD-10-CM | POA: Diagnosis not present

## 2015-09-16 DIAGNOSIS — I4891 Unspecified atrial fibrillation: Secondary | ICD-10-CM | POA: Insufficient documentation

## 2015-09-16 DIAGNOSIS — H538 Other visual disturbances: Secondary | ICD-10-CM | POA: Diagnosis not present

## 2015-09-16 DIAGNOSIS — Z950 Presence of cardiac pacemaker: Secondary | ICD-10-CM | POA: Insufficient documentation

## 2015-09-16 DIAGNOSIS — Z880 Allergy status to penicillin: Secondary | ICD-10-CM | POA: Insufficient documentation

## 2015-09-16 DIAGNOSIS — I5022 Chronic systolic (congestive) heart failure: Secondary | ICD-10-CM | POA: Diagnosis present

## 2015-09-16 DIAGNOSIS — R0602 Shortness of breath: Secondary | ICD-10-CM | POA: Diagnosis not present

## 2015-09-16 DIAGNOSIS — K219 Gastro-esophageal reflux disease without esophagitis: Secondary | ICD-10-CM | POA: Insufficient documentation

## 2015-09-16 DIAGNOSIS — R42 Dizziness and giddiness: Secondary | ICD-10-CM | POA: Diagnosis not present

## 2015-09-16 DIAGNOSIS — N189 Chronic kidney disease, unspecified: Secondary | ICD-10-CM | POA: Insufficient documentation

## 2015-09-16 DIAGNOSIS — Z9010 Acquired absence of unspecified breast and nipple: Secondary | ICD-10-CM | POA: Insufficient documentation

## 2015-09-16 DIAGNOSIS — I959 Hypotension, unspecified: Secondary | ICD-10-CM | POA: Diagnosis not present

## 2015-09-16 DIAGNOSIS — Z7901 Long term (current) use of anticoagulants: Secondary | ICD-10-CM | POA: Diagnosis not present

## 2015-09-16 DIAGNOSIS — R109 Unspecified abdominal pain: Secondary | ICD-10-CM | POA: Diagnosis not present

## 2015-09-16 DIAGNOSIS — Z8619 Personal history of other infectious and parasitic diseases: Secondary | ICD-10-CM | POA: Insufficient documentation

## 2015-09-16 DIAGNOSIS — I482 Chronic atrial fibrillation, unspecified: Secondary | ICD-10-CM

## 2015-09-16 NOTE — Progress Notes (Signed)
Subjective:    Patient ID: Sheryl Hughes, female    DOB: 1931/05/18, 82 y.o.   MRN: [id number]  Congestive Heart Failure Presents for follow-up visit. The disease course has been stable. Associated symptoms include abdominal pain ("over left side where hernia is present"), fatigue and shortness of breath. Pertinent negatives include no chest pain, chest pressure, edema, orthopnea or palpitations. The symptoms have been stable. Past treatments include beta blockers, digoxin and salt and fluid restriction. The treatment provided moderate relief. Compliance with prior treatments has been good. Her past medical history is significant for arrhythmia and CAD. There is no history of DM or HTN. She has multiple 1st degree relatives with heart disease.  Atrial Fibrillation Presents for follow-up visit. Symptoms include dizziness, hypotension and shortness of breath. Symptoms are negative for bradycardia, chest pain, hypertension and palpitations. The symptoms have been stable. Past treatments include anticoagulant, beta blockers, warfarin and antiarrhythmics. Compliance with prior treatments has been good. Past medical history includes atrial fibrillation, CAD and CHF. There is no history of HTN. There are no medication compliance problems.   Past Medical History  Diagnosis Date  . Atrial fibrillation ([place])   . CHF (congestive heart failure) ([place])   . Shingles   . CAD (coronary artery disease)   . Pulmonary hypertension ([place])   . GERD (gastroesophageal reflux disease)   . CKD (chronic kidney disease)     Past Surgical History  Procedure Laterality Date  . Mastectomy    . Cardiac catheterization    . Back surgery    . Eye surgery    . Thoracentesis Bilateral   . Pacemaker placement      defibrillator  . Mitral valve repair    . Coronary artery bypass graft    . Esophagogastroduodenoscopy (egd) with propofol N/A 12/08/2014    Procedure: ESOPHAGOGASTRODUODENOSCOPY (EGD) WITH PROPOFOL;  Surgeon:  Lucilla Lame, MD;  Location: [facility] ENDOSCOPY;  Service: Endoscopy;  Laterality: N/A;  Look into stomach with scope.  . Colonoscopy with propofol N/A 12/08/2014    Procedure: COLONOSCOPY WITH PROPOFOL;  Surgeon: Lucilla Lame, MD;  Location: [facility] ENDOSCOPY;  Service: Endoscopy;  Laterality: N/A;  look into colon with scope    Family History  Problem Relation Age of Onset  . Heart attack Mother   . Heart attack Father   . COPD Father   . Ulcers Father   . Atrial fibrillation Sister   . Lung cancer Sister   . Heart attack Brother   . Aortic stenosis Brother   . CAD Brother     Social History  Substance Use Topics  . Smoking status: Never Smoker   . Smokeless tobacco: Never Used  . Alcohol Use: No    Allergies  Allergen Reactions  . Biaxin [Clarithromycin] Other (See Comments)    Reaction:  Numbness of lips   . Flagyl [Metronidazole] Other (See Comments)    Reaction:  Numbness of lips   . Penicillins Rash and Other (See Comments)    Pt is unable to answer additional questions about this medication because it happened so long ago.  . Tetracyclines & Related Rash    Prior to Admission medications   Medication Sig Start Date End Date Taking? Authorizing Provider  aspirin EC 81 MG tablet Take 81 mg by mouth daily.   Yes Historical Provider, MD  atorvastatin (LIPITOR) 20 MG tablet Take 20 mg by mouth at bedtime.    Yes Historical Provider, MD  Calcium Carb-Cholecalciferol (CALCIUM 600 + D  PO) Take 1 tablet by mouth daily.   Yes Historical Provider, MD  Cholecalciferol (VITAMIN D) 2000 UNITS CAPS Take 2,000 Units by mouth daily.   Yes Historical Provider, MD  digoxin (LANOXIN) 0.125 MG tablet Take 1 tablet (0.125 mg total) by mouth daily. 08/05/15  Yes Theodoro Grist, MD  docusate sodium (COLACE) 100 MG capsule Take 100 mg by mouth 2 (two) times daily.   Yes Historical Provider, MD  dronedarone (MULTAQ) 400 MG tablet Take 1 tablet (400 mg total) by mouth every morning. 08/06/15  Yes Theodoro Grist, MD  furosemide (LASIX) 40 MG tablet Take 40 mg by mouth daily.    Yes Historical Provider, MD  gabapentin (NEURONTIN) 100 MG capsule Take 100 mg by mouth 3 (three) times daily.    Yes Historical Provider, MD  metolazone (ZAROXOLYN) 2.5 MG tablet Take 2.5 mg by mouth 2 (two) times a week.    Yes Historical Provider, MD  metoprolol succinate (TOPROL-XL) 25 MG 24 hr tablet Take 25 mg by mouth daily.    Yes Historical Provider, MD  pantoprazole (PROTONIX) 40 MG tablet Take 40 mg by mouth daily.    Yes Historical Provider, MD  polyethylene glycol (MIRALAX / GLYCOLAX) packet Take 17 g by mouth daily. 08/05/15  Yes Theodoro Grist, MD  potassium chloride (K-DUR) 10 MEQ tablet Take 30 mEq by mouth 2 (two) times daily.    Yes Historical Provider, MD  senna (SENOKOT) 8.6 MG TABS tablet Take 1 tablet (8.6 mg total) by mouth at bedtime. 08/05/15  Yes Theodoro Grist, MD  vitamin B-12 (CYANOCOBALAMIN) 1000 MCG tablet Take 1,000 mcg by mouth daily.   Yes Historical Provider, MD  warfarin (COUMADIN) 5 MG tablet Take 5 mg by mouth daily. Take 1 tablet three times weekly and 1/2 a tablet four times weekly.   Yes Historical Provider, MD      Review of Systems  Constitutional: Positive for fatigue. Negative for appetite change.  HENT: Negative for congestion, rhinorrhea and sore throat.   Eyes: Positive for visual disturbance ("blurry vision"). Negative for pain.  Respiratory: Positive for shortness of breath. Negative for cough, chest tightness and wheezing.   Cardiovascular: Negative for chest pain, palpitations and leg swelling.  Gastrointestinal: Positive for abdominal pain ("over left side where hernia is present"). Negative for abdominal distention.  Endocrine: Negative.   Genitourinary: Negative.   Musculoskeletal: Positive for back pain. Negative for neck pain.  Skin: Negative.  Negative for rash.       "itching all over"   Allergic/Immunologic: Negative.   Neurological: Positive for dizziness.  Negative for light-headedness and headaches.  Hematological: Negative for adenopathy. Bruises/bleeds easily.  Psychiatric/Behavioral: Positive for sleep disturbance ("waking up frequently"). Negative for dysphoric mood. The patient is not nervous/anxious.        Objective:   Physical Exam  Constitutional: She is oriented to person, place, and time. She appears well-developed and well-nourished.  HENT:  Head: Normocephalic and atraumatic.  Eyes: Conjunctivae are normal. Pupils are equal, round, and reactive to light.  Neck: Normal range of motion. Neck supple.  Cardiovascular: Normal rate and regular rhythm.   Pulmonary/Chest: Effort normal. She has no wheezes. She has no rales.  Abdominal: Soft. She exhibits no distension. There is tenderness (LUQ).  Musculoskeletal: She exhibits no edema or tenderness.  Neurological: She is alert and oriented to person, place, and time.  Skin: Skin is warm and dry.  Psychiatric: She has a normal mood and affect. Her behavior is normal. Thought  content normal.  Nursing note and vitals reviewed.   BP 115/54 mmHg  Pulse 75  Resp 20  Ht 5\' 4"  (1.626 m)  Wt 87 lb (39.463 kg)  BMI 14.93 kg/m2  SpO2 99%       Assessment & Plan:  1: Chronic heart failure with reduced ejection fraction- Patient presents with fatigue and shortness of breath with minimal exertion (Class III) which does improve upon rest. She says that occasionally she has to rest even after getting dressed in the mornings. She continues to weigh herself daily and says that her weight has been stable. By our scale, she's lost 3 pounds since she was last here on 08/16/15. Reminded to call for an overnight weight gain of >2 pounds or a weekly weight gain of >5 pounds. She is not adding any additional salt to her food and tries to be aware of sodium content. Admits to eating some high salty foods like hot dogs on infrequent occasions. Is drinking 1/3 can of Ensure daily over ice cream. Her  cardiologist has recently stopped her spironolactone to see if the medication was causing her itching (she doesn't think the itching has improved). Digoxin bottle instructions say every other day but cardiology note says to take digoxin daily. Patient is calling cardiologist back to update on itching and she was advised to clarify digoxin instructions with the nurse.  2: Atrial fibrillation- Currently rate controlled at this time. Taking digoxin, dronedarone, metoprolol and warfarin. Follows closely with cardiology. 3: Hypotension- Blood pressure on the low side and she does have to change positions slowly. If she stands too quickly, she will get a little dizzy along with some blurry vision which resolves quickly. She was encouraged to change positions slowly. 4: Chronic kidney disease- Discussed needing lab work in the next week or two as her spironolactone has been stopped but she continues to take 49meq potassium twice daily. Instructed her to speak with cardiologist about potassium change as she may only need to take 6meq twice daily until she gets her lab work done. Does take furosemide 40mg  daily.  Medication list was reviewed with the patient.  Patient has an appointment with a surgeon (Dr. Jamal Collin) on 09/18/15 for evaluation regarding her hernia.   Return here in 3 months or sooner for any questions/problems before then.

## 2015-09-16 NOTE — Patient Instructions (Signed)
Continue weighing daily and call for an overnight weight gain of > 2 pounds or a weekly weight gain of >5 pounds. 

## 2015-09-18 ENCOUNTER — Encounter: Payer: Self-pay | Admitting: General Surgery

## 2015-09-18 ENCOUNTER — Ambulatory Visit (INDEPENDENT_AMBULATORY_CARE_PROVIDER_SITE_OTHER): Payer: Medicare Other | Admitting: General Surgery

## 2015-09-18 ENCOUNTER — Ambulatory Visit: Payer: Self-pay | Admitting: General Surgery

## 2015-09-18 VITALS — BP 102/62 | HR 62 | Resp 12 | Ht 64.0 in | Wt 86.0 lb

## 2015-09-18 DIAGNOSIS — R109 Unspecified abdominal pain: Secondary | ICD-10-CM

## 2015-09-18 NOTE — Progress Notes (Signed)
Patient ID: Sheryl Hughes, female   DOB: [birth year], 82 y.o.   MRN: [id number]  Chief Complaint  Patient presents with  . Hernia    HPI Sheryl Hughes is a 82 y.o. female.  Patient here today for an evaluation of a hernia.  She states that they have noticed pain and a knot for about one month.  It does seem to be causing some left lower abdominal pain.  No nausea, vomiting, constipation or diarrhea noted. It came with constipation but she is not constipated now that she is on senokot, stool softeners and miralax. She admits to increased gas. She is here today with her daughter in law, Sheryl Hughes. She has home health aids, [facility] [employer], that help her around the house since her CHF has gotten worse. I have reviewed the history of present illness with the patient.  HPI  Past Medical History  Diagnosis Date  . Atrial fibrillation ([place])   . CHF (congestive heart failure) ([place])   . Shingles   . CAD (coronary artery disease)   . Pulmonary hypertension ([place])   . GERD (gastroesophageal reflux disease)   . CKD (chronic kidney disease)     Past Surgical History  Procedure Laterality Date  . Mastectomy    . Cardiac catheterization    . Back surgery    . Eye surgery    . Thoracentesis Bilateral   . Pacemaker placement      defibrillator  . Mitral valve repair    . Coronary artery bypass graft    . Esophagogastroduodenoscopy (egd) with propofol N/A 12/08/2014    Procedure: ESOPHAGOGASTRODUODENOSCOPY (EGD) WITH PROPOFOL;  Surgeon: Lucilla Lame, MD;  Location: [facility] ENDOSCOPY;  Service: Endoscopy;  Laterality: N/A;  Look into stomach with scope.  . Colonoscopy with propofol N/A 12/08/2014    Procedure: COLONOSCOPY WITH PROPOFOL;  Surgeon: Lucilla Lame, MD;  Location: [facility] ENDOSCOPY;  Service: Endoscopy;  Laterality: N/A;  look into colon with scope    Family History  Problem Relation Age of Onset  . Heart attack Mother   . Heart attack Father   . COPD Father   . Ulcers Father   . Atrial  fibrillation Sister   . Lung cancer Sister   . Heart attack Brother   . Aortic stenosis Brother   . CAD Brother     Social History Social History  Substance Use Topics  . Smoking status: Never Smoker   . Smokeless tobacco: Never Used  . Alcohol Use: No    Allergies  Allergen Reactions  . Biaxin [Clarithromycin] Other (See Comments)    Reaction:  Numbness of lips   . Flagyl [Metronidazole] Other (See Comments)    Reaction:  Numbness of lips   . Penicillins Rash and Other (See Comments)    Pt is unable to answer additional questions about this medication because it happened so long ago.  . Tetracyclines & Related Rash    Current Outpatient Prescriptions  Medication Sig Dispense Refill  . aspirin EC 81 MG tablet Take 81 mg by mouth daily.    Marland Kitchen atorvastatin (LIPITOR) 20 MG tablet Take 20 mg by mouth at bedtime.     . Calcium Carb-Cholecalciferol (CALCIUM 600 + D PO) Take 1 tablet by mouth daily.    . Cholecalciferol (VITAMIN D) 2000 UNITS CAPS Take 2,000 Units by mouth daily.    . digoxin (LANOXIN) 0.125 MG tablet Take 1 tablet (0.125 mg total) by mouth daily. 30 tablet 5  . docusate  sodium (COLACE) 100 MG capsule Take 100 mg by mouth 2 (two) times daily.    Marland Kitchen dronedarone (MULTAQ) 400 MG tablet Take 1 tablet (400 mg total) by mouth every morning. 30 tablet 5  . furosemide (LASIX) 40 MG tablet Take 40 mg by mouth daily.     Marland Kitchen gabapentin (NEURONTIN) 100 MG capsule Take 100 mg by mouth 3 (three) times daily.     . metolazone (ZAROXOLYN) 2.5 MG tablet Take 2.5 mg by mouth 2 (two) times a week.     . metoprolol succinate (TOPROL-XL) 25 MG 24 hr tablet Take 25 mg by mouth daily.     . pantoprazole (PROTONIX) 40 MG tablet Take 40 mg by mouth daily.     . polyethylene glycol (MIRALAX / GLYCOLAX) packet Take 17 g by mouth daily. 14 each 0  . potassium chloride (K-DUR) 10 MEQ tablet Take 30 mEq by mouth 2 (two) times daily.     Marland Kitchen senna (SENOKOT) 8.6 MG TABS tablet Take 1 tablet (8.6 mg  total) by mouth at bedtime. 120 each 0  . vitamin B-12 (CYANOCOBALAMIN) 1000 MCG tablet Take 1,000 mcg by mouth daily.    Marland Kitchen warfarin (COUMADIN) 5 MG tablet Take 5 mg by mouth daily. Take 1 tablet three times weekly and 1/2 a tablet four times weekly.     No current facility-administered medications for this visit.    Review of Systems Review of Systems  Constitutional: Negative.   Respiratory: Negative.   Cardiovascular: Negative.     Blood pressure 102/62, pulse 62, resp. rate 12, height 5\' 4"  (1.626 m), weight 86 lb (39.009 kg).  Physical Exam Physical Exam  Constitutional: She is oriented to person, place, and time. She appears well-developed and well-nourished.  HENT:  Mouth/Throat: Oropharynx is clear and moist.  Eyes: Conjunctivae are normal. No scleral icterus.  Neck: Neck supple.  Cardiovascular: Normal rate, regular rhythm and normal heart sounds.   Pulmonary/Chest: Effort normal and breath sounds normal.  Abdominal: Soft. Normal appearance and bowel sounds are normal. There is no tenderness.  Diffuse weakness lower left abdomen  Lymphadenopathy:    She has no cervical adenopathy.  Neurological: She is alert and oriented to person, place, and time.  Skin: Skin is warm and dry.  Psychiatric: Her behavior is normal.  No defined hernia noted on exam.  Data Reviewed Progress notes. CT scan from last yr- bil pleural effusions, no hernia noted  Assessment    Diffuse weakness lower left abdomen. Does not feel like a hernia. Repeat Ct may be of benefit. Pt is high risk for any surgery due to underlying cardiac and pulmonary problems.    Plan   No intervention recommended at present.      PCP:  Kirk Ruths This information has been scribed by Karie Fetch RN, BSN,BC.    Dejanique Ruehl G 09/20/2015, 8:18 AM

## 2015-09-18 NOTE — Patient Instructions (Signed)
The patient is aware to call back for any questions or concerns.  

## 2015-09-20 ENCOUNTER — Encounter: Payer: Self-pay | Admitting: General Surgery

## 2015-12-17 ENCOUNTER — Ambulatory Visit: Payer: Medicare Other | Admitting: Family

## 2015-12-19 ENCOUNTER — Encounter: Payer: Self-pay | Admitting: Family

## 2015-12-19 ENCOUNTER — Ambulatory Visit: Payer: Medicare Other | Attending: Family | Admitting: Family

## 2015-12-19 VITALS — BP 106/52 | HR 70 | Resp 18 | Ht 64.0 in | Wt 90.0 lb

## 2015-12-19 DIAGNOSIS — Z79899 Other long term (current) drug therapy: Secondary | ICD-10-CM | POA: Insufficient documentation

## 2015-12-19 DIAGNOSIS — R109 Unspecified abdominal pain: Secondary | ICD-10-CM | POA: Diagnosis not present

## 2015-12-19 DIAGNOSIS — N189 Chronic kidney disease, unspecified: Secondary | ICD-10-CM | POA: Insufficient documentation

## 2015-12-19 DIAGNOSIS — I4891 Unspecified atrial fibrillation: Secondary | ICD-10-CM | POA: Diagnosis not present

## 2015-12-19 DIAGNOSIS — Z888 Allergy status to other drugs, medicaments and biological substances status: Secondary | ICD-10-CM | POA: Diagnosis not present

## 2015-12-19 DIAGNOSIS — Z880 Allergy status to penicillin: Secondary | ICD-10-CM | POA: Diagnosis not present

## 2015-12-19 DIAGNOSIS — I5022 Chronic systolic (congestive) heart failure: Secondary | ICD-10-CM | POA: Diagnosis not present

## 2015-12-19 DIAGNOSIS — Z8619 Personal history of other infectious and parasitic diseases: Secondary | ICD-10-CM | POA: Diagnosis not present

## 2015-12-19 DIAGNOSIS — Z8249 Family history of ischemic heart disease and other diseases of the circulatory system: Secondary | ICD-10-CM | POA: Diagnosis not present

## 2015-12-19 DIAGNOSIS — K5909 Other constipation: Secondary | ICD-10-CM | POA: Diagnosis not present

## 2015-12-19 DIAGNOSIS — Z9889 Other specified postprocedural states: Secondary | ICD-10-CM | POA: Insufficient documentation

## 2015-12-19 DIAGNOSIS — Z9010 Acquired absence of unspecified breast and nipple: Secondary | ICD-10-CM | POA: Insufficient documentation

## 2015-12-19 DIAGNOSIS — Z7982 Long term (current) use of aspirin: Secondary | ICD-10-CM | POA: Insufficient documentation

## 2015-12-19 DIAGNOSIS — I2510 Atherosclerotic heart disease of native coronary artery without angina pectoris: Secondary | ICD-10-CM | POA: Diagnosis not present

## 2015-12-19 DIAGNOSIS — K5904 Chronic idiopathic constipation: Secondary | ICD-10-CM

## 2015-12-19 DIAGNOSIS — K219 Gastro-esophageal reflux disease without esophagitis: Secondary | ICD-10-CM | POA: Diagnosis not present

## 2015-12-19 DIAGNOSIS — I272 Pulmonary hypertension, unspecified: Secondary | ICD-10-CM | POA: Insufficient documentation

## 2015-12-19 DIAGNOSIS — I482 Chronic atrial fibrillation, unspecified: Secondary | ICD-10-CM

## 2015-12-19 NOTE — Progress Notes (Signed)
Patient ID: Sheryl Hughes, female    DOB: 01-Apr-1931, 82 y.o.   MRN: [id number]  HPI  Sheryl Hughes is a 82 y/o female with a history of shingles, pulmonary HTN, GERD, CKD, CAD, constipation, atrial fibrillation and chronic heart failure.  Last echo was done 08/02/14 which showed an EF of 15-20% with moderate mitral and tricuspid valve insufficiency and mild pulmonary HTN. Last cardiac catheterization was done in 2014.  Was last admitted to [facility] on 07/31/15 with a heart failure exacerbation along with atrial fibrillation. She was treated with IV diuretics along with dobutamine. Cardiology and nephrology consulted on her. She diuresed about 3.7 liters during this admission.   She presents today for a follow-up visit with fatigue and shortness of breath with exertion. Symptoms are relieved at rest. No swelling in her legs. Continues to weigh daily and reports a stable weight. Biggest issue right now is her chronic constipation, gassy/bloated feeling and intermittent abdominal pain.   Past Medical History:  Diagnosis Date  . Atrial fibrillation ([place])   . CAD (coronary artery disease)   . CHF (congestive heart failure) ([place])   . CKD (chronic kidney disease)   . GERD (gastroesophageal reflux disease)   . Pulmonary hypertension   . Shingles     Past Surgical History:  Procedure Laterality Date  . BACK SURGERY    . CARDIAC CATHETERIZATION    . COLONOSCOPY WITH PROPOFOL N/A 12/08/2014   Procedure: COLONOSCOPY WITH PROPOFOL;  Surgeon: Lucilla Lame, MD;  Location: [facility] ENDOSCOPY;  Service: Endoscopy;  Laterality: N/A;  look into colon with scope  . CORONARY ARTERY BYPASS GRAFT    . ESOPHAGOGASTRODUODENOSCOPY (EGD) WITH PROPOFOL N/A 12/08/2014   Procedure: ESOPHAGOGASTRODUODENOSCOPY (EGD) WITH PROPOFOL;  Surgeon: Lucilla Lame, MD;  Location: [facility] ENDOSCOPY;  Service: Endoscopy;  Laterality: N/A;  Look into stomach with scope.  Marland Kitchen EYE SURGERY    . MASTECTOMY    . MITRAL VALVE REPAIR    . PACEMAKER  PLACEMENT     defibrillator  . THORACENTESIS Bilateral     Family History  Problem Relation Age of Onset  . Heart attack Mother   . Heart attack Father   . COPD Father   . Ulcers Father   . Atrial fibrillation Sister   . Lung cancer Sister   . Heart attack Brother   . Aortic stenosis Brother   . CAD Brother     Social History  Substance Use Topics  . Smoking status: Never Smoker  . Smokeless tobacco: Never Used  . Alcohol use No    Allergies  Allergen Reactions  . Biaxin [Clarithromycin] Other (See Comments)    Reaction:  Numbness of lips   . Flagyl [Metronidazole] Other (See Comments)    Reaction:  Numbness of lips   . Penicillins Rash and Other (See Comments)    Pt is unable to answer additional questions about this medication because it happened so long ago.  . Tetracyclines & Related Rash    Prior to Admission medications   Medication Sig Start Date End Date Taking? Authorizing Provider  albuterol (PROVENTIL HFA;VENTOLIN HFA) 108 (90 Base) MCG/ACT inhaler Inhale 2 puffs into the lungs daily.   Yes Historical Provider, MD  aspirin EC 81 MG tablet Take 81 mg by mouth daily.   Yes Historical Provider, MD  atorvastatin (LIPITOR) 20 MG tablet Take 20 mg by mouth at bedtime.    Yes Historical Provider, MD  Calcium Carb-Cholecalciferol (CALCIUM 600 + D PO) Take 1 tablet  by mouth daily.   Yes Historical Provider, MD  Cholecalciferol (VITAMIN D) 2000 UNITS CAPS Take 2,000 Units by mouth daily.   Yes Historical Provider, MD  docusate sodium (COLACE) 100 MG capsule Take 100 mg by mouth 2 (two) times daily.   Yes Historical Provider, MD  dronedarone (MULTAQ) 400 MG tablet Take 1 tablet (400 mg total) by mouth every morning. 08/06/15  Yes Theodoro Grist, MD  furosemide (LASIX) 40 MG tablet Take 40 mg by mouth daily.    Yes Historical Provider, MD  gabapentin (NEURONTIN) 100 MG capsule Take 100 mg by mouth 3 (three) times daily.    Yes Historical Provider, MD  pantoprazole  (PROTONIX) 40 MG tablet Take 40 mg by mouth daily.    Yes Historical Provider, MD  polyethylene glycol (MIRALAX / GLYCOLAX) packet Take 17 g by mouth daily. 08/05/15  Yes Theodoro Grist, MD  potassium chloride (K-DUR) 10 MEQ tablet Take 30 mEq by mouth daily.    Yes Historical Provider, MD  senna (SENOKOT) 8.6 MG TABS tablet Take 1 tablet (8.6 mg total) by mouth at bedtime. 08/05/15  Yes Theodoro Grist, MD  vitamin B-12 (CYANOCOBALAMIN) 1000 MCG tablet Take 1,000 mcg by mouth daily.   Yes Historical Provider, MD  warfarin (COUMADIN) 5 MG tablet Take 5 mg by mouth daily. Take 1 tablet three times weekly and 1/2 a tablet four times weekly.   Yes Historical Provider, MD  metoprolol succinate (TOPROL-XL) 25 MG 24 hr tablet Take 25 mg by mouth daily.     Historical Provider, MD     Review of Systems  Constitutional: Positive for fatigue. Negative for appetite change.  HENT: Positive for rhinorrhea. Negative for congestion and sore throat.   Eyes: Negative.   Respiratory: Positive for shortness of breath. Negative for chest tightness.   Cardiovascular: Negative for chest pain, palpitations and leg swelling.  Gastrointestinal: Positive for abdominal distention, abdominal pain (left upper side) and constipation.       "gassy"   Endocrine: Negative.   Genitourinary: Negative.   Musculoskeletal: Positive for back pain (at times). Negative for neck pain.  Skin: Negative.   Allergic/Immunologic: Negative.   Neurological: Negative for dizziness and light-headedness.  Hematological: Negative for adenopathy. Does not bruise/bleed easily.  Psychiatric/Behavioral: Negative for dysphoric mood and sleep disturbance. The patient is not nervous/anxious.     Vitals:   12/19/15 1306  BP: (!) 106/52  Pulse: 70  Resp: 18  Weight: 90 lb (40.8 kg)  Height: 5\' 4"  (1.626 m)     Physical Exam  Constitutional: She is oriented to person, place, and time. She appears well-developed and well-nourished.  HENT:   Head: Normocephalic and atraumatic.  Eyes: Conjunctivae are normal. Pupils are equal, round, and reactive to light.  Neck: Normal range of motion. Neck supple.  Cardiovascular: Normal rate.  An irregular rhythm present.  Pulmonary/Chest: Effort normal. She has no wheezes. She has no rales.  Abdominal: Soft. She exhibits no distension. There is no tenderness.  Musculoskeletal: She exhibits no edema or tenderness.  Neurological: She is alert and oriented to person, place, and time.  Skin: Skin is warm and dry.  Psychiatric: She has a normal mood and affect. Her behavior is normal. Thought content normal.  Nursing note and vitals reviewed.  Assessment & Plan:  1: Chronic heart failure with reduced ejection fraction- - NYHA Class II - euvolemic today - Continue weighing daily. Weight up 2.6 pounds since she was last here on 09/16/15. - No longer  taking metolazone. Can take an extra furosemide if needed based on weight, shortness of breath or swelling. Took an extra furosemide last week.  - Received flu vaccine already - Appointment with cardiologist ([place]) on 02/06/16  2: Atrial fibrillation- - Rate controlled at this time - Digoxin has been stopped - Continues on dronedarone, toprol xl and warfarin  3: Chronic constipation- - Ongoing issue - Sees PCP 04/14/16 - Going to call her GI provider to get an appointment  Patient would prefer to not make a return appointment at this time. She will call for any questions/problems or if she'd like to make an appointment.

## 2015-12-19 NOTE — Patient Instructions (Signed)
Continue weighing daily and call for an overnight weight gain of > 2 pounds or a weekly weight gain of >5 pounds. 

## 2017-04-24 IMAGING — RF DG SNIFF TEST
2 series · 4 of 4 positions shown · non-contrast
Comparison: None.

CLINICAL DATA: History of valvular heart disease and coronary
artery disease status post valve repair and replacement and stent
placement ; left mastectomy in 4504, bilateral pleural effusions.

EXAM:
CHEST FLUOROSCOPY
TECHNIQUE: Real-time fluoroscopic evaluation of the chest was performed.
RADIOPHARMACEUTICALS:  None
FLUOROSCOPY TIME:  0 minutes, 30 seconds, 2 spot films obtained.

[Series 1: fluoro_chest_bb · 0.18mm/px · 2 of 2 frames shown (1 of 2)]
[frame 1/2]
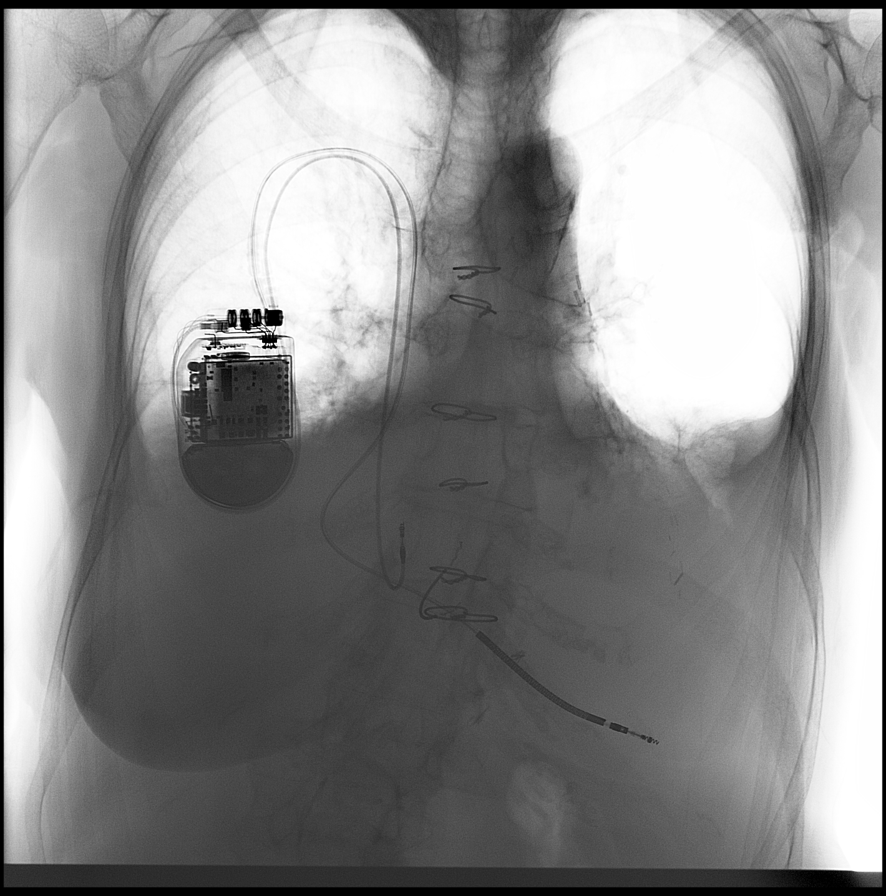
[frame 2/2]
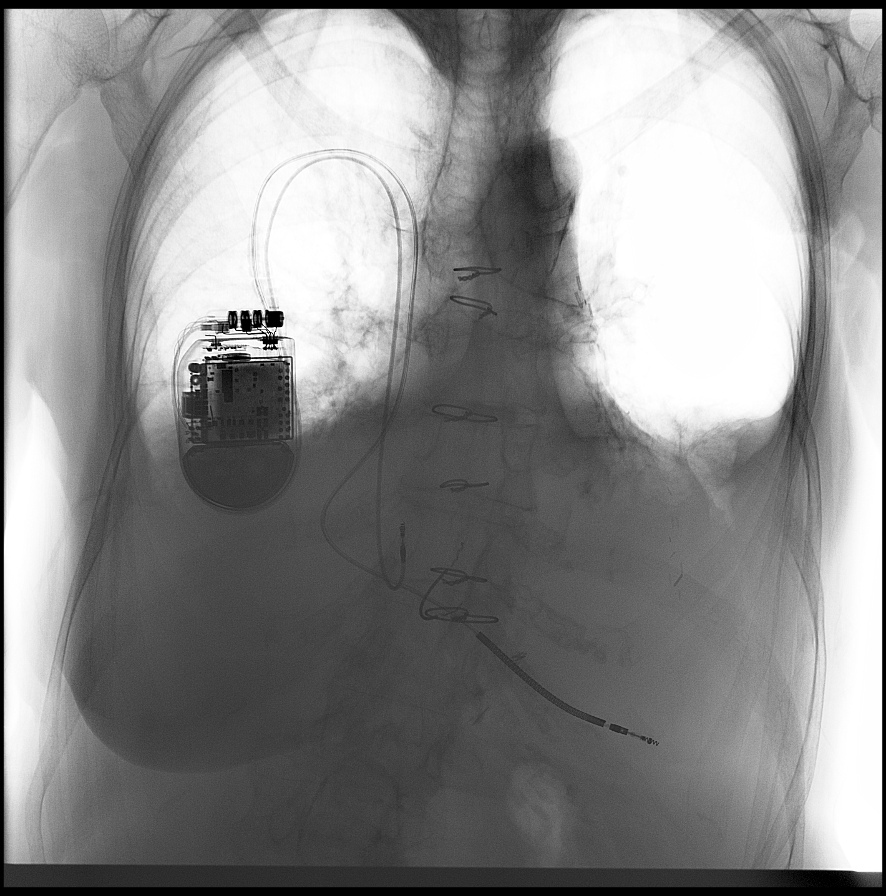

[Series 2: fluoro_chest_bb · 0.18mm/px · 2 of 2 frames shown (2 of 2)]
[frame 1/2]
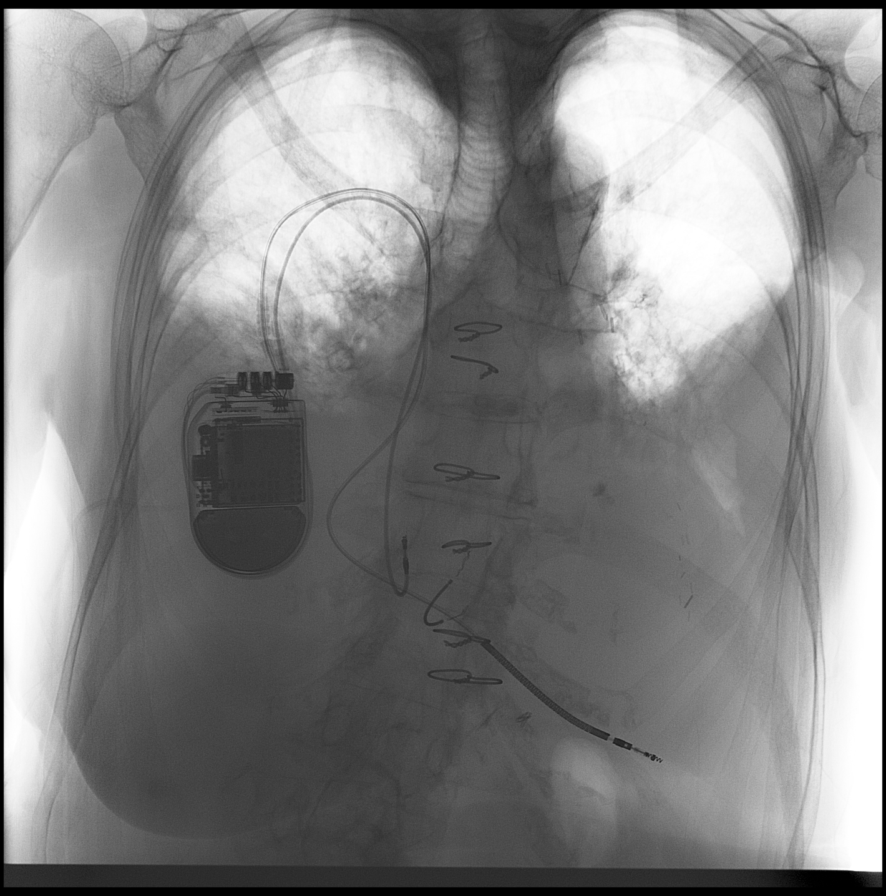
[frame 2/2]
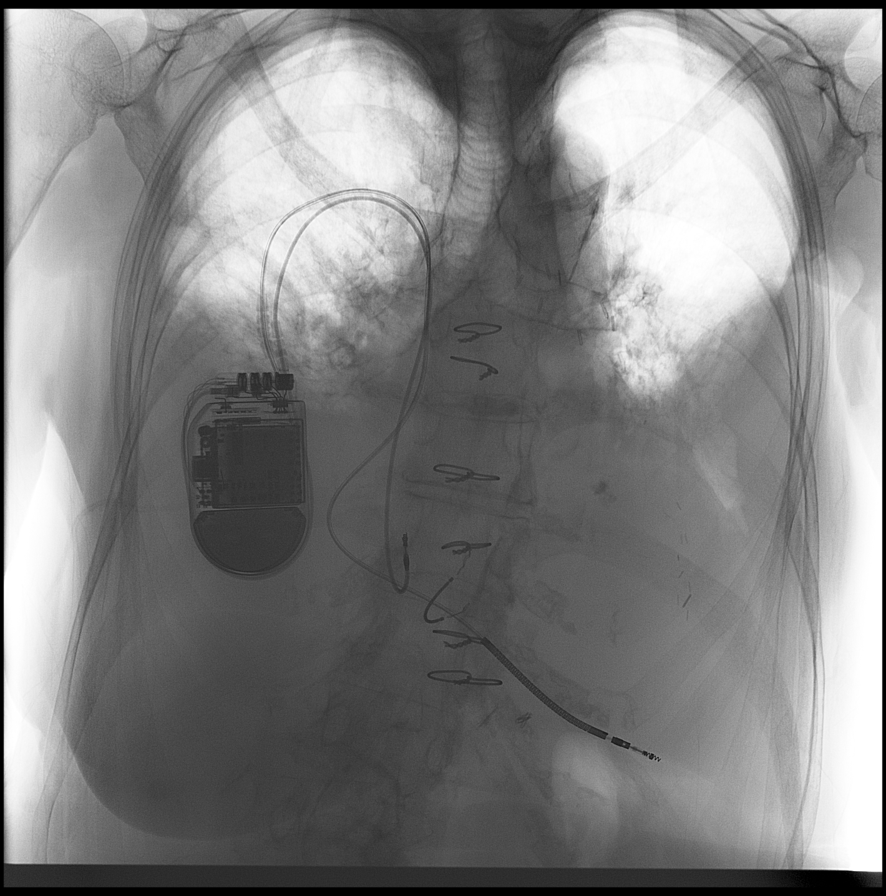

[4 of 4 positions shown; findings below may reference images not displayed]

FINDINGS: The lungs are adequately inflated the permanent pacemaker
defibrillator is in reasonable position radiographically. There are
6 intact sternal wires. There are moderate-sized bilateral pleural
effusions. The motion of the diaphragms is symmetrical. During the
cough and sniff maneuver is appropriate diaphragmatic motion is
demonstrated.
IMPRESSION: Normal diaphragmatic motion on this study. There are bilateral
pleural effusions.

## 2018-02-06 IMAGING — CR DG CHEST 2V
2 series · 2 of 2 positions shown · non-contrast
Comparison: 07/04/2015

CLINICAL DATA: 83-year-old female with a history of recurrent
pleural effusion.

EXAM:
CHEST - 2 VIEW

[chest pa]
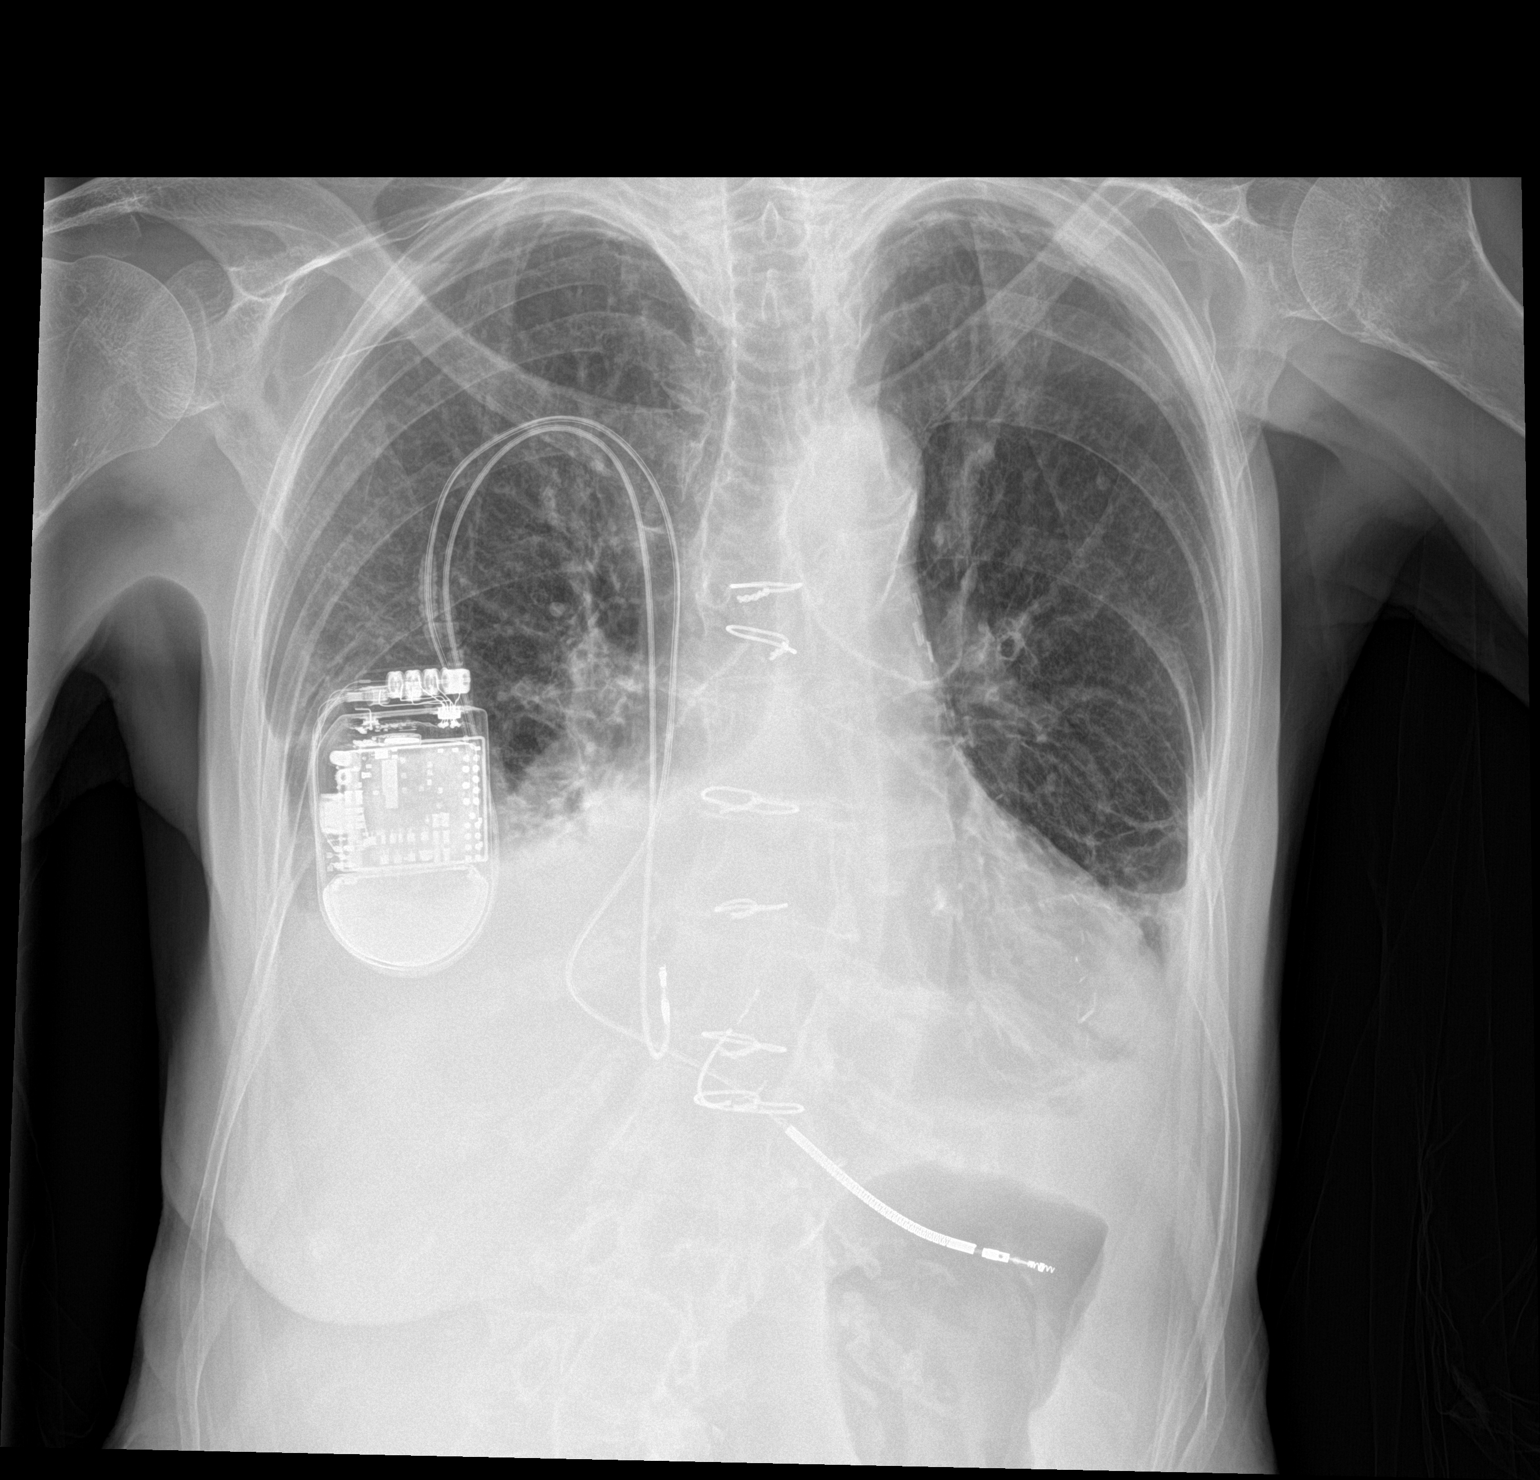

[chest lat]
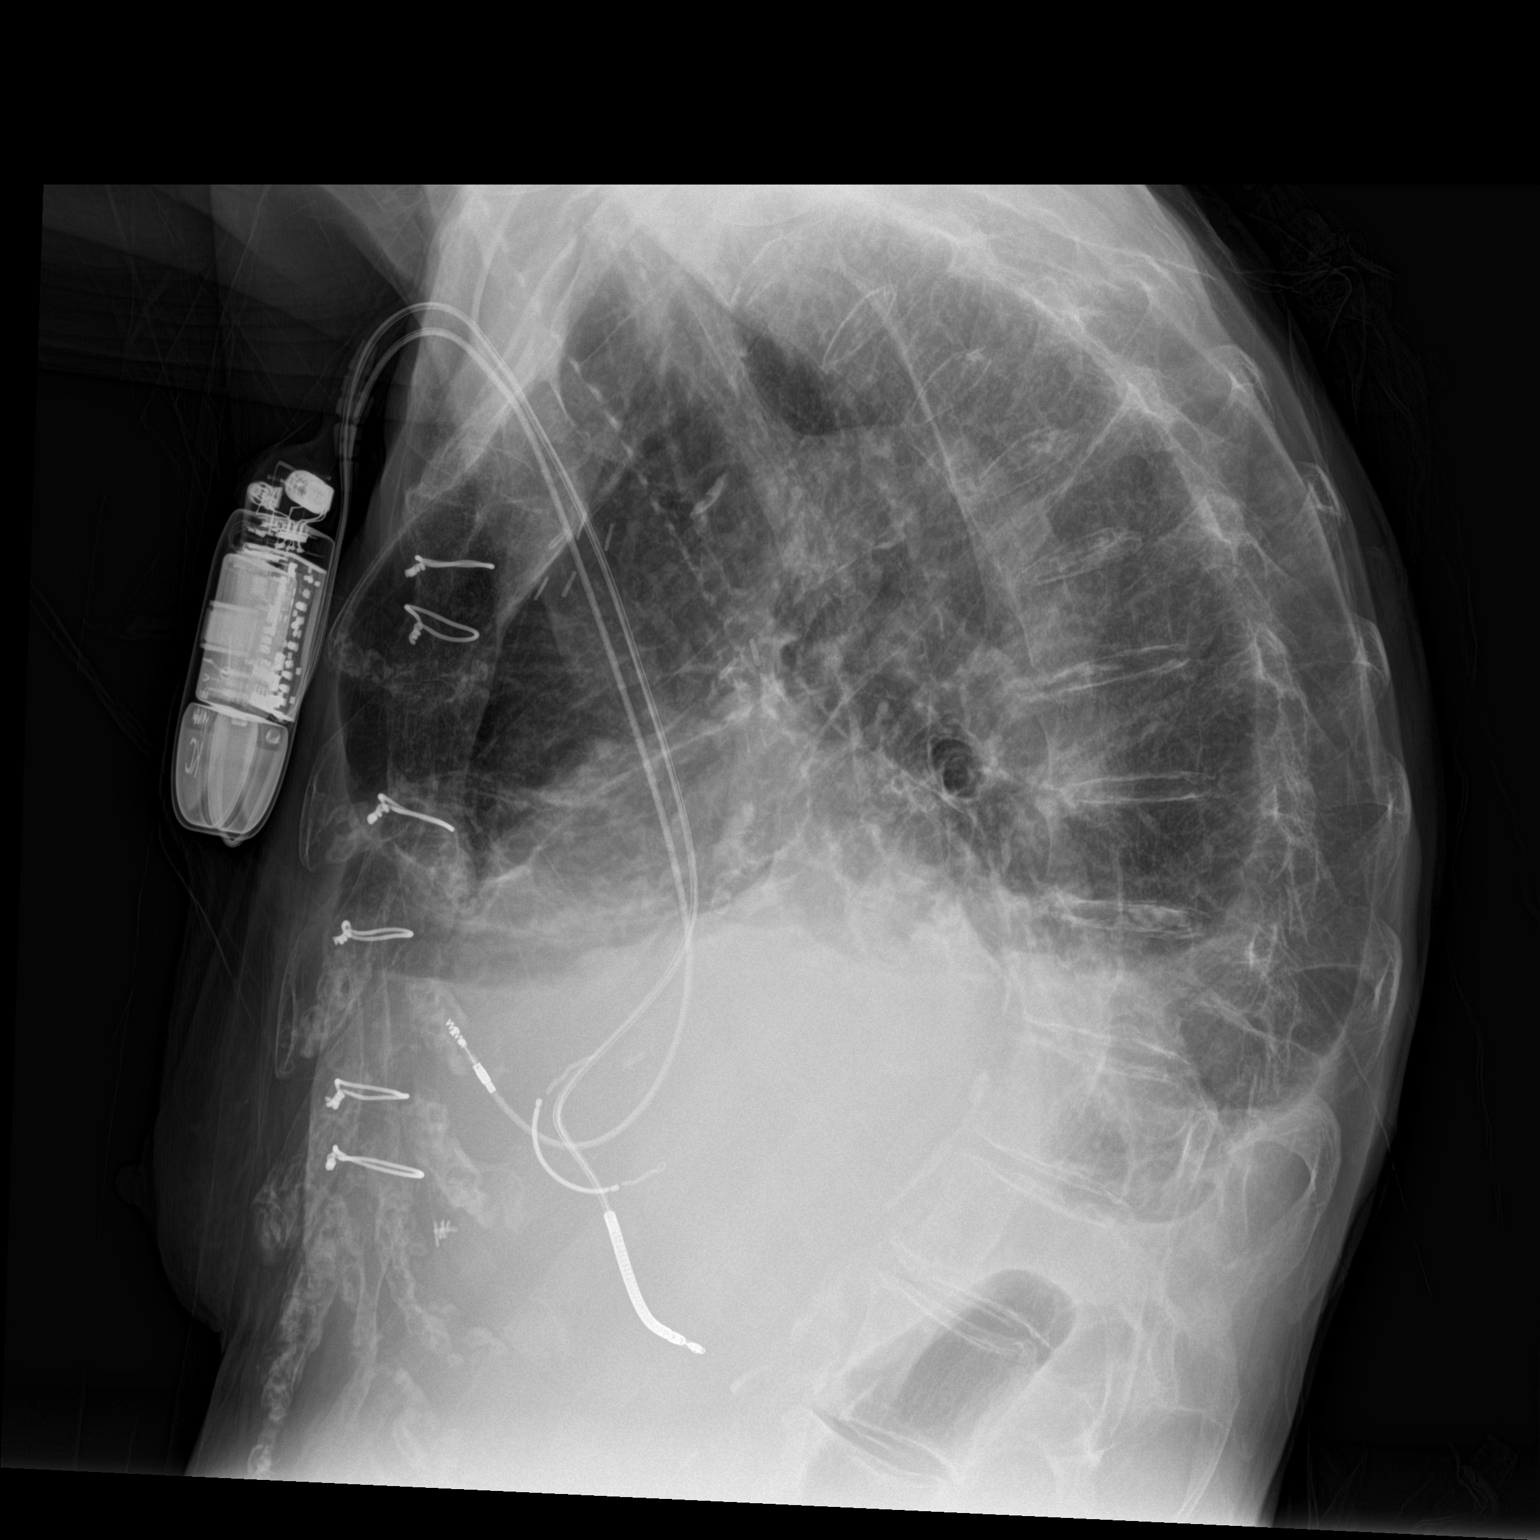

[2 of 2 positions shown; findings below may reference images not displayed]

FINDINGS: No visualized pneumothorax status post thoracentesis. Significant
reduction in the size of left-sided pleural effusion.

In the interval there has been reaccumulation of right-sided pleural
effusion, with small the moderate volume obscuring the right heart
border.

Unchanged position of right chest wall AICD.

Surgical changes of median sternotomy.

No confluent airspace disease with chronic scarring visible.

No displaced fracture.  Osteopenia.  Atherosclerosis.
IMPRESSION: Status post left-sided thoracentesis there is no evidence of
pneumothorax with significantly decreased pleural fluid.

In the interval there has been reaccumulation of right-sided pleural
effusion.

Similar appearance of chronic lung changes.

## 2018-08-10 ENCOUNTER — Other Ambulatory Visit
Admission: RE | Admit: 2018-08-10 | Discharge: 2018-08-10 | Disposition: A | Discharge status: Home / Self Care | Payer: Medicare Other | Source: Ambulatory Visit | Attending: Specialist | Admitting: Specialist

## 2018-08-10 DIAGNOSIS — J811 Chronic pulmonary edema: Secondary | ICD-10-CM | POA: Insufficient documentation

## 2018-08-10 DIAGNOSIS — R0602 Shortness of breath: Secondary | ICD-10-CM | POA: Insufficient documentation

## 2018-08-10 LAB — BRAIN NATRIURETIC PEPTIDE: B Natriuretic Peptide: 797 pg/mL — ABNORMAL HIGH (ref 0.0–100.0)

## 2018-08-23 ENCOUNTER — Other Ambulatory Visit: Payer: Self-pay | Admitting: Nurse Practitioner

## 2018-08-23 DIAGNOSIS — K219 Gastro-esophageal reflux disease without esophagitis: Secondary | ICD-10-CM

## 2018-09-07 ENCOUNTER — Ambulatory Visit
Admission: RE | Admit: 2018-09-07 | Discharge: 2018-09-07 | Disposition: A | Discharge status: Home / Self Care | Payer: Medicare Other | Source: Ambulatory Visit | Attending: Nurse Practitioner | Admitting: Nurse Practitioner

## 2018-09-07 ENCOUNTER — Other Ambulatory Visit: Payer: Self-pay

## 2018-09-07 ENCOUNTER — Ambulatory Visit: Admission: RE | Admit: 2018-09-07 | Payer: Medicare Other | Source: Ambulatory Visit

## 2018-09-07 DIAGNOSIS — K219 Gastro-esophageal reflux disease without esophagitis: Secondary | ICD-10-CM | POA: Diagnosis not present

## 2018-10-14 ENCOUNTER — Other Ambulatory Visit: Payer: Self-pay | Admitting: Nurse Practitioner

## 2018-10-14 ENCOUNTER — Other Ambulatory Visit (HOSPITAL_COMMUNITY): Payer: Self-pay | Admitting: Nurse Practitioner

## 2018-10-14 DIAGNOSIS — R1013 Epigastric pain: Secondary | ICD-10-CM

## 2018-10-14 DIAGNOSIS — R140 Abdominal distension (gaseous): Secondary | ICD-10-CM

## 2018-10-19 ENCOUNTER — Other Ambulatory Visit: Payer: Self-pay

## 2018-10-19 ENCOUNTER — Ambulatory Visit
Admission: RE | Admit: 2018-10-19 | Discharge: 2018-10-19 | Disposition: A | Discharge status: Home / Self Care | Payer: Medicare Other | Source: Ambulatory Visit | Attending: Nurse Practitioner | Admitting: Nurse Practitioner

## 2018-10-19 DIAGNOSIS — R140 Abdominal distension (gaseous): Secondary | ICD-10-CM | POA: Insufficient documentation

## 2018-10-19 DIAGNOSIS — R1013 Epigastric pain: Secondary | ICD-10-CM | POA: Diagnosis present

## 2018-10-24 ENCOUNTER — Other Ambulatory Visit: Payer: Self-pay

## 2018-10-25 ENCOUNTER — Other Ambulatory Visit: Payer: Self-pay

## 2018-10-25 ENCOUNTER — Other Ambulatory Visit (HOSPITAL_COMMUNITY): Payer: Self-pay | Admitting: Specialist

## 2018-10-25 ENCOUNTER — Inpatient Hospital Stay: Payer: Medicare Other | Attending: Oncology | Admitting: Oncology

## 2018-10-25 VITALS — BP 111/73 | HR 93 | Temp 98.2°F | Wt 94.4 lb

## 2018-10-25 DIAGNOSIS — R109 Unspecified abdominal pain: Secondary | ICD-10-CM | POA: Diagnosis not present

## 2018-10-25 DIAGNOSIS — R5381 Other malaise: Secondary | ICD-10-CM | POA: Insufficient documentation

## 2018-10-25 DIAGNOSIS — J90 Pleural effusion, not elsewhere classified: Secondary | ICD-10-CM

## 2018-10-25 DIAGNOSIS — Z7982 Long term (current) use of aspirin: Secondary | ICD-10-CM | POA: Insufficient documentation

## 2018-10-25 DIAGNOSIS — K219 Gastro-esophageal reflux disease without esophagitis: Secondary | ICD-10-CM | POA: Insufficient documentation

## 2018-10-25 DIAGNOSIS — E785 Hyperlipidemia, unspecified: Secondary | ICD-10-CM | POA: Diagnosis not present

## 2018-10-25 DIAGNOSIS — I2510 Atherosclerotic heart disease of native coronary artery without angina pectoris: Secondary | ICD-10-CM | POA: Diagnosis not present

## 2018-10-25 DIAGNOSIS — R5383 Other fatigue: Secondary | ICD-10-CM | POA: Insufficient documentation

## 2018-10-25 DIAGNOSIS — N189 Chronic kidney disease, unspecified: Secondary | ICD-10-CM

## 2018-10-25 DIAGNOSIS — D72810 Lymphocytopenia: Secondary | ICD-10-CM | POA: Diagnosis present

## 2018-10-25 DIAGNOSIS — Z7901 Long term (current) use of anticoagulants: Secondary | ICD-10-CM

## 2018-10-25 DIAGNOSIS — Z79899 Other long term (current) drug therapy: Secondary | ICD-10-CM

## 2018-10-25 DIAGNOSIS — R0609 Other forms of dyspnea: Secondary | ICD-10-CM

## 2018-10-25 DIAGNOSIS — I5022 Chronic systolic (congestive) heart failure: Secondary | ICD-10-CM | POA: Diagnosis not present

## 2018-10-25 NOTE — Progress Notes (Signed)
Pt here for new visit with Dr. Janese Banks due to lymphocytopenia.

## 2018-10-27 ENCOUNTER — Encounter: Payer: Self-pay | Admitting: Oncology

## 2018-10-27 NOTE — Progress Notes (Signed)
Hematology/Oncology Consult note [facility] [phone] [phone]  Patient Care Team: Kirk Ruths, MD as PCP - General (Internal Medicine) Alisa Graff, FNP as Nurse Practitioner (Family Medicine) Isaias Cowman, MD as Consulting Physician (Cardiology) Anthonette Legato, MD as Consulting Physician (Nephrology) Erby Pian, MD as Referring Physician (Specialist) Kirk Ruths, MD (Internal Medicine) Christene Lye, MD (General Surgery)   Name of the patient: Sheryl Hughes  [id number]  [birth year]    Reason for referral- Lymphopenia   Referring physician- Dawson Bills NP  Date of visit: 10/27/18   History of presenting illness-  Patient is a 85 year old female with a past medical history significant for CAD CHF CKD and GERD among other medical problems.  Her baseline EF is between 25 to 30%.  She has been referred to me for lymphopenia.  Looking back at her prior CBCs over the last 4 years patient has had a normal white count mostly between 5-7.  Her hemoglobin typically ranges between 11-12 and she has had a normal platelet count.  Differential has already shown mild lymphopenia at least dating back to October 2016 with relative neutrophilia.  She has been having ongoing intermittent abdominal pain for which she has seen GI .  She also underwent CT abdomen and pelvis without contrast in August 2020 which did not show any evidence of acute findings.  She was found to have consolidation at the lung bases and is also scheduled to undergo CT scan of her chest later this month.  She reports that her appetite is fair and her weight has remained fairly stable.  She feels fatigued but denies other complaints  ECOG PS- 1  Pain scale- 0   Review of systems- Review of Systems  Constitutional: Positive for malaise/fatigue. Negative for chills, fever and weight loss.  HENT: Negative for congestion, ear discharge and  nosebleeds.   Eyes: Negative for blurred vision.  Respiratory: Negative for cough, hemoptysis, sputum production, shortness of breath and wheezing.   Cardiovascular: Negative for chest pain, palpitations, orthopnea and claudication.  Gastrointestinal: Positive for abdominal pain. Negative for blood in stool, constipation, diarrhea, heartburn, melena, nausea and vomiting.  Genitourinary: Negative for dysuria, flank pain, frequency, hematuria and urgency.  Musculoskeletal: Negative for back pain, joint pain and myalgias.  Skin: Negative for rash.  Neurological: Negative for dizziness, tingling, focal weakness, seizures, weakness and headaches.  Endo/Heme/Allergies: Does not bruise/bleed easily.  Psychiatric/Behavioral: Negative for depression and suicidal ideas. The patient does not have insomnia.     Allergies  Allergen Reactions   Biaxin [Clarithromycin] Other (See Comments)    Reaction:  Numbness of lips    Flagyl [Metronidazole] Other (See Comments)    Reaction:  Numbness of lips    Penicillins Rash and Other (See Comments)    Pt is unable to answer additional questions about this medication because it happened so long ago.   Tetracyclines & Related Rash    Patient Active Problem List   Diagnosis Date Noted   Atrial fibrillation ([place]) 08/05/2015   Pulmonary hypertension ([place]) 08/05/2015   Benign neoplasm of sigmoid colon    Second degree hemorrhoids    Diverticulosis of large intestine without diverticulitis    Warfarin-induced coagulopathy ([place]) 12/05/2014   Hypotension 12/05/2014   Headache 12/05/2014   Bilateral pleural effusion 10/21/2014   Hyperkalemia 10/21/2014   Elevated troponin 10/17/2014   CAD (coronary artery disease) 10/17/2014   HLD (hyperlipidemia) 10/17/2014   GERD (gastroesophageal reflux disease) 10/17/2014  Chronic kidney disease (CKD), stage IV (severe) (HCC) 35/32/9924   Chronic systolic congestive heart failure ([place]) 10/17/2014    Constipation 10/17/2014     Past Medical History:  Diagnosis Date   Atrial fibrillation (HCC)    CAD (coronary artery disease)    CHF (congestive heart failure) (HCC)    CKD (chronic kidney disease)    GERD (gastroesophageal reflux disease)    Pulmonary hypertension ([place])    Shingles      Past Surgical History:  Procedure Laterality Date   BACK SURGERY     CARDIAC CATHETERIZATION     COLONOSCOPY WITH PROPOFOL N/A 12/08/2014   Procedure: COLONOSCOPY WITH PROPOFOL;  Surgeon: Lucilla Lame, MD;  Location: [facility] ENDOSCOPY;  Service: Endoscopy;  Laterality: N/A;  look into colon with scope   CORONARY ARTERY BYPASS GRAFT     ESOPHAGOGASTRODUODENOSCOPY (EGD) WITH PROPOFOL N/A 12/08/2014   Procedure: ESOPHAGOGASTRODUODENOSCOPY (EGD) WITH PROPOFOL;  Surgeon: Lucilla Lame, MD;  Location: [facility] ENDOSCOPY;  Service: Endoscopy;  Laterality: N/A;  Look into stomach with scope.   EYE SURGERY     MASTECTOMY     MITRAL VALVE REPAIR     PACEMAKER PLACEMENT     defibrillator   THORACENTESIS Bilateral     Social History   Socioeconomic History   Marital status: Married    Spouse name: Not on file   Number of children: Not on file   Years of education: Not on file   Highest education level: Not on file  Occupational History   Not on file  Social Needs   Financial resource strain: Not on file   Food insecurity    Worry: Not on file    Inability: Not on file   Transportation needs    Medical: Not on file    Non-medical: Not on file  Tobacco Use   Smoking status: Never Smoker   Smokeless tobacco: Never Used  Substance and Sexual Activity   Alcohol use: No    Alcohol/week: 0.0 standard drinks   Drug use: No   Sexual activity: Not on file  Lifestyle   Physical activity    Days per week: Not on file    Minutes per session: Not on file   Stress: Not on file  Relationships   Social connections    Talks on phone: Not on file    Gets together: Not on file     Attends religious service: Not on file    Active member of club or organization: Not on file    Attends meetings of clubs or organizations: Not on file    Relationship status: Not on file   Intimate partner violence    Fear of current or ex partner: Not on file    Emotionally abused: Not on file    Physically abused: Not on file    Forced sexual activity: Not on file  Other Topics Concern   Not on file  Social History Narrative   Not on file     Family History  Problem Relation Age of Onset   Heart attack Mother    Heart attack Father    COPD Father    Ulcers Father    Atrial fibrillation Sister    Lung cancer Sister    Heart attack Brother    Aortic stenosis Brother    CAD Brother      Current Outpatient Medications:    albuterol (PROVENTIL HFA;VENTOLIN HFA) 108 (90 Base) MCG/ACT inhaler, Inhale 2 puffs into the lungs daily.,  Disp: , Rfl:    aspirin EC 81 MG tablet, Take 81 mg by mouth daily., Disp: , Rfl:    atorvastatin (LIPITOR) 20 MG tablet, Take 20 mg by mouth at bedtime. , Disp: , Rfl:    atorvastatin (LIPITOR) 20 MG tablet, TAKE 1 TABLET BY MOUTH EVERY DAY, Disp: , Rfl:    Calcium Carb-Cholecalciferol (CALCIUM 600 + D PO), Take 1 tablet by mouth daily., Disp: , Rfl:    Cholecalciferol (VITAMIN D) 2000 UNITS CAPS, Take 2,000 Units by mouth daily., Disp: , Rfl:    docusate sodium (COLACE) 100 MG capsule, Take 100 mg by mouth 2 (two) times daily., Disp: , Rfl:    furosemide (LASIX) 40 MG tablet, Take 40 mg by mouth daily. , Disp: , Rfl:    furosemide (LASIX) 40 MG tablet, TAKE 1 TABLET BY MOUTH DAILY AND AN EXTRA TABLET AS NEEDED FOR SWELLING, Disp: , Rfl:    gabapentin (NEURONTIN) 100 MG capsule, Take 100 mg by mouth 3 (three) times daily. , Disp: , Rfl:    gabapentin (NEURONTIN) 100 MG capsule, TAKE 1 CAPSULE BY MOUTH UP TO THREE TIMES DAILY AS NEEDED FOR BACK PAIN, Disp: , Rfl:    metoprolol succinate (TOPROL-XL) 100 MG 24 hr tablet, TAKE 1  TABLET BY MOUTH ONCE DAILY, Disp: , Rfl:    metoprolol succinate (TOPROL-XL) 25 MG 24 hr tablet, Take 25 mg by mouth daily. , Disp: , Rfl:    pantoprazole (PROTONIX) 40 MG tablet, Take 40 mg by mouth daily. , Disp: , Rfl:    pantoprazole (PROTONIX) 40 MG tablet, TAKE 1 TABLET BY MOUTH TWICE DAILY, Disp: , Rfl:    polyethylene glycol (MIRALAX / GLYCOLAX) packet, Take 17 g by mouth daily., Disp: 14 each, Rfl: 0   potassium chloride (K-DUR) 10 MEQ tablet, Take 30 mEq by mouth daily. , Disp: , Rfl:    vitamin B-12 (CYANOCOBALAMIN) 1000 MCG tablet, Take 1,000 mcg by mouth daily., Disp: , Rfl:    warfarin (COUMADIN) 5 MG tablet, Take 5 mg by mouth daily. Take 1 tablet three times weekly and 1/2 a tablet four times weekly., Disp: , Rfl:    warfarin (COUMADIN) 5 MG tablet, TAKE 1 TABLET BY MOUTH ONCE DAILY, AS DIRECTED PER MD., Disp: , Rfl:    Cholecalciferol (VITAMIN D-1000 MAX ST) 25 MCG (1000 UT) tablet, Take by mouth., Disp: , Rfl:    docusate sodium (COLACE) 100 MG capsule, Take by mouth., Disp: , Rfl:    dronedarone (MULTAQ) 400 MG tablet, Take 1 tablet (400 mg total) by mouth every morning. (Patient not taking: Reported on 10/25/2018), Disp: 30 tablet, Rfl: 5   potassium chloride (K-DUR) 10 MEQ tablet, Take by mouth., Disp: , Rfl:    senna (SENOKOT) 8.6 MG TABS tablet, Take 1 tablet (8.6 mg total) by mouth at bedtime. (Patient not taking: Reported on 10/25/2018), Disp: 120 each, Rfl: 0   Physical exam:  Vitals:   10/25/18 1339  BP: 111/73  Pulse: 93  Temp: 98.2 F (36.8 C)  Weight: 94 lb 6.4 oz (42.8 kg)   Physical Exam Constitutional:      Comments: Thin elderly lady in no acute distress  HENT:     Head: Normocephalic and atraumatic.  Eyes:     Pupils: Pupils are equal, round, and reactive to light.  Neck:     Musculoskeletal: Normal range of motion.  Cardiovascular:     Rate and Rhythm: Normal rate and regular rhythm.  Heart sounds: Normal heart sounds.  Pulmonary:      Effort: Pulmonary effort is normal.     Breath sounds: Normal breath sounds.  Abdominal:     General: Bowel sounds are normal. There is no distension.     Palpations: Abdomen is soft.     Tenderness: There is no abdominal tenderness.  Lymphadenopathy:     Comments: No palpable cervical, supraclavicular, axillary or inguinal adenopathy   Skin:    General: Skin is warm and dry.  Neurological:     Mental Status: She is alert and oriented to person, place, and time.        CMP Latest Ref Rng & Units 08/16/2015  Glucose 65 - 99 mg/dL 87  BUN 6 - 20 mg/dL 25(H)  Creatinine 0.44 - 1.00 mg/dL 1.47(H)  Sodium 135 - 145 mmol/L 137  Potassium 3.5 - 5.1 mmol/L 4.7  Chloride 101 - 111 mmol/L 98(L)  CO2 22 - 32 mmol/L 31  Calcium 8.9 - 10.3 mg/dL 9.4  Total Protein 6.5 - 8.1 g/dL -  Total Bilirubin 0.3 - 1.2 mg/dL -  Alkaline Phos 38 - 126 U/L -  AST 15 - 41 U/L -  ALT 14 - 54 U/L -   CBC Latest Ref Rng & Units 08/04/2015  WBC 3.6 - 11.0 K/uL -  Hemoglobin 12.0 - 16.0 g/dL 11.3(L)  Hematocrit 35.0 - 47.0 % -  Platelets 150 - 440 K/uL -    No images are attached to the encounter.  Ct Abdomen Pelvis Wo Contrast  Result Date: 10/19/2018 CLINICAL DATA:  Epigastric and generalized abdominal pain with bloating after eating. EXAM: CT ABDOMEN AND PELVIS WITHOUT CONTRAST TECHNIQUE: Multidetector CT imaging of the abdomen and pelvis was performed following the standard protocol without IV contrast. COMPARISON:  12/05/2014 FINDINGS: Lower chest: There is dependent bilateral lower lobe collapse/consolidation, potentially chronic possibly related to atelectasis/scar. The heart is enlarged. Contrast material is noted in the lumen of the distal esophagus. Hepatobiliary: No focal abnormality in the liver on this study without intravenous contrast. No intrahepatic or extrahepatic biliary dilation. Pancreas: No focal mass lesion. No dilatation of the main duct. No intraparenchymal cyst. No  peripancreatic edema. Spleen: No splenomegaly. No focal mass lesion. Adrenals/Urinary Tract: No adrenal nodule or mass. Bilateral renal cysts are evident. Dominant cyst in the left kidney has decreased in the interval measuring 3.9 cm today compared to 5.8 cm previously. No evidence for hydroureter. The urinary bladder appears normal for the degree of distention. Stomach/Bowel: Stomach is unremarkable. No gastric wall thickening. No evidence of outlet obstruction. Duodenum is normally positioned as is the ligament of Treitz. No small bowel wall thickening. No small bowel dilatation. The terminal ileum is normal. The appendix is normal. No gross colonic mass. No colonic wall thickening. Vascular/Lymphatic: There is abdominal aortic atherosclerosis without aneurysm. There is no gastrohepatic or hepatoduodenal ligament lymphadenopathy. No intraperitoneal or retroperitoneal lymphadenopathy. No pelvic sidewall lymphadenopathy. Reproductive: The uterus is unremarkable.  There is no adnexal mass. Other: No intraperitoneal free fluid. Musculoskeletal: No worrisome lytic or sclerotic osseous abnormality. IMPRESSION: 1. No acute findings in the abdomen/pelvis. No findings to explain the patient's history of generalized abdominal pain and bloating. 2. Bilateral areas of collapse/consolidation in the lung bases. Features are probably related to chronic scarring/rounded atelectasis based on appearance. Pneumonia considered less likely but not excluded. While neoplasm is not considered likely, cannot be excluded. Consider CT chest with contrast for more definitive characterization. 3. Bilateral renal cysts. 4.  Aortic Atherosclerois (ICD10-170.0) Electronically Signed   By: Misty Stanley M.D.   On: 10/19/2018 15:06    Assessment and plan- Patient is a 85 y.o. female referred for lymphopenia  On looking back at her prior CBC with differentials patient has had a normal white count with minor variations over the last 5 years.   She has had chronic mild lymphopenia with an absolute lymphocyte count fluctuating between 0.7-0.8 over the last 4 years.  She has had intermittent relative neutrophilia and monocytosis with an absolute normal ANC and a normal monocyte count.  Her hemoglobin and platelet counts are normal.  Lymphopenia is nonspecific and can be seen in variety of conditions including autoimmune conditions Infections as well as possible malignancies.  However patient is 85 years of age and has had chronic lymphopenia for the last 4 years with stable counts.  I therefore do not suspect a primary hemopoietic malignancy at this time as it would have manifested with declining counts over the last 4 years.  It is also unlikely that her abdominal pain is secondary to her lymphopenia and patient is also undergone a CT abdomen which was unremarkable  Overall given her age and frailty, her lymphopenia can be monitored every 6 months without any further intervention at this time and if there is a consistent downward trend in her counts or if she develops new cytopenias she can be referred to [place] in the future.  Patient verbalized understanding  Thank you for this kind referral and the opportunity to participate in the care of this patient   Visit Diagnosis 1. Lymphopenia     Dr. Randa Evens, MD, MPH [facility] [phone] 10/27/2018 9:07 AM

## 2018-11-03 ENCOUNTER — Ambulatory Visit: Payer: Medicare Other

## 2018-11-04 ENCOUNTER — Other Ambulatory Visit: Payer: Self-pay

## 2018-11-04 ENCOUNTER — Ambulatory Visit
Admission: RE | Admit: 2018-11-04 | Discharge: 2018-11-04 | Disposition: A | Discharge status: Home / Self Care | Payer: Medicare Other | Source: Ambulatory Visit | Attending: Specialist | Admitting: Specialist

## 2018-11-04 DIAGNOSIS — R0609 Other forms of dyspnea: Secondary | ICD-10-CM | POA: Diagnosis present

## 2018-11-04 DIAGNOSIS — J90 Pleural effusion, not elsewhere classified: Secondary | ICD-10-CM | POA: Diagnosis present

## 2018-11-04 HISTORY — DX: Essential (primary) hypertension: I10

## 2018-11-04 MED ORDER — IOHEXOL 300 MG/ML  SOLN: 75.0000 mL | Freq: Once | INTRAMUSCULAR | Status: DC | PRN

## 2018-11-04 MED ORDER — IOHEXOL 300 MG/ML  SOLN
60.0000 mL | Freq: Once | INTRAMUSCULAR | Status: AC | PRN
  Administered 2018-11-04: 60 mL via INTRAVENOUS

## 2019-08-09 ENCOUNTER — Emergency Department: Payer: Medicare Other

## 2019-08-09 ENCOUNTER — Inpatient Hospital Stay
Admission: EM | Admit: 2019-08-09 | Discharge: 2019-08-15 | DRG: 291 | Disposition: A | Discharge status: Hospice | Payer: Medicare Other | Attending: Internal Medicine | Admitting: Internal Medicine

## 2019-08-09 ENCOUNTER — Encounter: Payer: Self-pay | Admitting: Emergency Medicine

## 2019-08-09 ENCOUNTER — Other Ambulatory Visit: Payer: Self-pay

## 2019-08-09 DIAGNOSIS — I959 Hypotension, unspecified: Secondary | ICD-10-CM | POA: Diagnosis not present

## 2019-08-09 DIAGNOSIS — J449 Chronic obstructive pulmonary disease, unspecified: Secondary | ICD-10-CM | POA: Diagnosis present

## 2019-08-09 DIAGNOSIS — R000 Tachycardia, unspecified: Secondary | ICD-10-CM | POA: Diagnosis not present

## 2019-08-09 DIAGNOSIS — I2510 Atherosclerotic heart disease of native coronary artery without angina pectoris: Secondary | ICD-10-CM | POA: Diagnosis present

## 2019-08-09 DIAGNOSIS — F329 Major depressive disorder, single episode, unspecified: Secondary | ICD-10-CM | POA: Diagnosis present

## 2019-08-09 DIAGNOSIS — R252 Cramp and spasm: Secondary | ICD-10-CM | POA: Diagnosis present

## 2019-08-09 DIAGNOSIS — N184 Chronic kidney disease, stage 4 (severe): Secondary | ICD-10-CM | POA: Diagnosis present

## 2019-08-09 DIAGNOSIS — I482 Chronic atrial fibrillation, unspecified: Secondary | ICD-10-CM | POA: Diagnosis present

## 2019-08-09 DIAGNOSIS — Z681 Body mass index (BMI) 19 or less, adult: Secondary | ICD-10-CM

## 2019-08-09 DIAGNOSIS — K219 Gastro-esophageal reflux disease without esophagitis: Secondary | ICD-10-CM | POA: Diagnosis present

## 2019-08-09 DIAGNOSIS — R627 Adult failure to thrive: Secondary | ICD-10-CM | POA: Diagnosis present

## 2019-08-09 DIAGNOSIS — I130 Hypertensive heart and chronic kidney disease with heart failure and stage 1 through stage 4 chronic kidney disease, or unspecified chronic kidney disease: Principal | ICD-10-CM | POA: Diagnosis present

## 2019-08-09 DIAGNOSIS — I5023 Acute on chronic systolic (congestive) heart failure: Secondary | ICD-10-CM | POA: Diagnosis present

## 2019-08-09 DIAGNOSIS — I480 Paroxysmal atrial fibrillation: Secondary | ICD-10-CM | POA: Diagnosis present

## 2019-08-09 DIAGNOSIS — R609 Edema, unspecified: Secondary | ICD-10-CM | POA: Diagnosis present

## 2019-08-09 DIAGNOSIS — K224 Dyskinesia of esophagus: Secondary | ICD-10-CM | POA: Diagnosis present

## 2019-08-09 DIAGNOSIS — R059 Cough, unspecified: Secondary | ICD-10-CM

## 2019-08-09 DIAGNOSIS — Z7901 Long term (current) use of anticoagulants: Secondary | ICD-10-CM

## 2019-08-09 DIAGNOSIS — R0600 Dyspnea, unspecified: Secondary | ICD-10-CM

## 2019-08-09 DIAGNOSIS — I272 Pulmonary hypertension, unspecified: Secondary | ICD-10-CM

## 2019-08-09 DIAGNOSIS — Z95810 Presence of automatic (implantable) cardiac defibrillator: Secondary | ICD-10-CM

## 2019-08-09 DIAGNOSIS — Z66 Do not resuscitate: Secondary | ICD-10-CM | POA: Diagnosis present

## 2019-08-09 DIAGNOSIS — J9601 Acute respiratory failure with hypoxia: Secondary | ICD-10-CM | POA: Diagnosis not present

## 2019-08-09 DIAGNOSIS — Z825 Family history of asthma and other chronic lower respiratory diseases: Secondary | ICD-10-CM

## 2019-08-09 DIAGNOSIS — Z79899 Other long term (current) drug therapy: Secondary | ICD-10-CM

## 2019-08-09 DIAGNOSIS — Z951 Presence of aortocoronary bypass graft: Secondary | ICD-10-CM

## 2019-08-09 DIAGNOSIS — Z515 Encounter for palliative care: Secondary | ICD-10-CM | POA: Diagnosis present

## 2019-08-09 DIAGNOSIS — E785 Hyperlipidemia, unspecified: Secondary | ICD-10-CM | POA: Diagnosis present

## 2019-08-09 DIAGNOSIS — I248 Other forms of acute ischemic heart disease: Secondary | ICD-10-CM | POA: Diagnosis present

## 2019-08-09 DIAGNOSIS — I420 Dilated cardiomyopathy: Secondary | ICD-10-CM | POA: Diagnosis present

## 2019-08-09 DIAGNOSIS — I4891 Unspecified atrial fibrillation: Secondary | ICD-10-CM | POA: Diagnosis present

## 2019-08-09 DIAGNOSIS — Z8249 Family history of ischemic heart disease and other diseases of the circulatory system: Secondary | ICD-10-CM

## 2019-08-09 DIAGNOSIS — I509 Heart failure, unspecified: Secondary | ICD-10-CM

## 2019-08-09 DIAGNOSIS — Z20822 Contact with and (suspected) exposure to covid-19: Secondary | ICD-10-CM | POA: Diagnosis present

## 2019-08-09 DIAGNOSIS — E43 Unspecified severe protein-calorie malnutrition: Secondary | ICD-10-CM | POA: Diagnosis present

## 2019-08-09 DIAGNOSIS — I4821 Permanent atrial fibrillation: Secondary | ICD-10-CM | POA: Diagnosis not present

## 2019-08-09 DIAGNOSIS — Z7189 Other specified counseling: Secondary | ICD-10-CM

## 2019-08-09 DIAGNOSIS — I255 Ischemic cardiomyopathy: Secondary | ICD-10-CM | POA: Diagnosis present

## 2019-08-09 DIAGNOSIS — Z7982 Long term (current) use of aspirin: Secondary | ICD-10-CM

## 2019-08-09 LAB — URINALYSIS, COMPLETE (UACMP) WITH MICROSCOPIC
Bacteria, UA: NONE SEEN
Bilirubin Urine: NEGATIVE
Glucose, UA: NEGATIVE mg/dL
Hgb urine dipstick: NEGATIVE
Ketones, ur: NEGATIVE mg/dL
Leukocytes,Ua: NEGATIVE
Nitrite: NEGATIVE
Protein, ur: NEGATIVE mg/dL
Specific Gravity, Urine: 1.009 (ref 1.005–1.030)
Squamous Epithelial / HPF: NONE SEEN (ref 0–5)
WBC, UA: NONE SEEN WBC/hpf (ref 0–5)
pH: 5 (ref 5.0–8.0)

## 2019-08-09 LAB — COMPREHENSIVE METABOLIC PANEL
ALT: 16 U/L (ref 0–44)
AST: 34 U/L (ref 15–41)
Albumin: 3.7 g/dL (ref 3.5–5.0)
Alkaline Phosphatase: 90 U/L (ref 38–126)
Anion gap: 8 (ref 5–15)
BUN: 36 mg/dL — ABNORMAL HIGH (ref 8–23)
CO2: 37 mmol/L — ABNORMAL HIGH (ref 22–32)
Calcium: 9 mg/dL (ref 8.9–10.3)
Chloride: 90 mmol/L — ABNORMAL LOW (ref 98–111)
Creatinine, Ser: 1.3 mg/dL — ABNORMAL HIGH (ref 0.44–1.00)
GFR calc Af Amer: 43 mL/min — ABNORMAL LOW (ref >60)
GFR calc non Af Amer: 37 mL/min — ABNORMAL LOW (ref >60)
Glucose, Bld: 100 mg/dL — ABNORMAL HIGH (ref 70–99)
Potassium: 4.8 mmol/L (ref 3.5–5.1)
Sodium: 135 mmol/L (ref 135–145)
Total Bilirubin: 0.7 mg/dL (ref 0.3–1.2)
Total Protein: 7 g/dL (ref 6.5–8.1)

## 2019-08-09 LAB — CBC WITH DIFFERENTIAL/PLATELET
Abs Immature Granulocytes: 0.02 10*3/uL (ref 0.00–0.07)
Basophils Absolute: 0 10*3/uL (ref 0.0–0.1)
Basophils Relative: 0 %
Eosinophils Absolute: 0.1 10*3/uL (ref 0.0–0.5)
Eosinophils Relative: 1 %
HCT: 36.8 % (ref 36.0–46.0)
Hemoglobin: 11.8 g/dL — ABNORMAL LOW (ref 12.0–15.0)
Immature Granulocytes: 0 %
Lymphocytes Relative: 8 %
Lymphs Abs: 0.5 10*3/uL — ABNORMAL LOW (ref 0.7–4.0)
MCH: 29.4 pg (ref 26.0–34.0)
MCHC: 32.1 g/dL (ref 30.0–36.0)
MCV: 91.5 fL (ref 80.0–100.0)
Monocytes Absolute: 1 10*3/uL (ref 0.1–1.0)
Monocytes Relative: 16 %
Neutro Abs: 4.5 10*3/uL (ref 1.7–7.7)
Neutrophils Relative %: 75 %
Platelets: 185 10*3/uL (ref 150–400)
RBC: 4.02 MIL/uL (ref 3.87–5.11)
RDW: 16.3 % — ABNORMAL HIGH (ref 11.5–15.5)
WBC: 6.1 10*3/uL (ref 4.0–10.5)
nRBC: 0 % (ref 0.0–0.2)

## 2019-08-09 LAB — TROPONIN I (HIGH SENSITIVITY)
Troponin I (High Sensitivity): 39 ng/L — ABNORMAL HIGH (ref <18)
Troponin I (High Sensitivity): 46 ng/L — ABNORMAL HIGH (ref <18)

## 2019-08-09 LAB — PROTIME-INR
INR: 2.3 — ABNORMAL HIGH (ref 0.8–1.2)
Prothrombin Time: 24.2 seconds — ABNORMAL HIGH (ref 11.4–15.2)

## 2019-08-09 LAB — SARS CORONAVIRUS 2 BY RT PCR (HOSPITAL ORDER, PERFORMED IN ~~LOC~~ HOSPITAL LAB): SARS Coronavirus 2: NEGATIVE

## 2019-08-09 LAB — BRAIN NATRIURETIC PEPTIDE: B Natriuretic Peptide: 3071.2 pg/mL — ABNORMAL HIGH (ref 0.0–100.0)

## 2019-08-09 MED ORDER — ASPIRIN EC 81 MG PO TBEC
81.0000 mg | DELAYED_RELEASE_TABLET | Freq: Every day | ORAL | Status: DC
  Administered 2019-08-10 – 2019-08-14 (×5): 81 mg via ORAL
  Filled 2019-08-09 (×6): qty 1

## 2019-08-09 MED ORDER — VITAMIN D3 25 MCG (1000 UNIT) PO TABS
2000.0000 [IU] | ORAL_TABLET | Freq: Every day | ORAL | Status: DC
  Administered 2019-08-10 – 2019-08-13 (×4): 2000 [IU] via ORAL
  Filled 2019-08-09 (×9): qty 2

## 2019-08-09 MED ORDER — POLYETHYLENE GLYCOL 3350 17 G PO PACK
17.0000 g | PACK | Freq: Every day | ORAL | Status: DC
  Administered 2019-08-11 – 2019-08-12 (×2): 17 g via ORAL
  Filled 2019-08-09 (×3): qty 1

## 2019-08-09 MED ORDER — SODIUM CHLORIDE 0.9 % IV SOLN: Freq: Once | INTRAVENOUS | Status: AC

## 2019-08-09 MED ORDER — PANTOPRAZOLE SODIUM 40 MG PO TBEC
40.0000 mg | DELAYED_RELEASE_TABLET | Freq: Every day | ORAL | Status: DC
  Administered 2019-08-09 – 2019-08-13 (×5): 40 mg via ORAL
  Filled 2019-08-09 (×6): qty 1

## 2019-08-09 MED ORDER — WARFARIN SODIUM 2.5 MG PO TABS
2.5000 mg | ORAL_TABLET | Freq: Every day | ORAL | Status: DC
  Administered 2019-08-10 – 2019-08-14 (×5): 2.5 mg via ORAL
  Filled 2019-08-09: qty 1
  Filled 2019-08-09: qty 0.5
  Filled 2019-08-09 (×3): qty 1

## 2019-08-09 MED ORDER — FUROSEMIDE 10 MG/ML IJ SOLN
40.0000 mg | Freq: Two times a day (BID) | INTRAMUSCULAR | Status: DC
  Administered 2019-08-10 – 2019-08-13 (×5): 40 mg via INTRAVENOUS
  Filled 2019-08-09 (×6): qty 4

## 2019-08-09 MED ORDER — SODIUM CHLORIDE 0.9% FLUSH
3.0000 mL | Freq: Two times a day (BID) | INTRAVENOUS | Status: DC
  Administered 2019-08-09 – 2019-08-14 (×9): 3 mL via INTRAVENOUS

## 2019-08-09 MED ORDER — DOCUSATE SODIUM 100 MG PO CAPS
100.0000 mg | ORAL_CAPSULE | Freq: Two times a day (BID) | ORAL | Status: DC
  Administered 2019-08-09 – 2019-08-13 (×9): 100 mg via ORAL
  Filled 2019-08-09 (×10): qty 1

## 2019-08-09 MED ORDER — WARFARIN - PHYSICIAN DOSING INPATIENT
Freq: Every day | Status: DC
  Filled 2019-08-09: qty 1

## 2019-08-09 MED ORDER — ONDANSETRON HCL 4 MG/2ML IJ SOLN
4.0000 mg | Freq: Four times a day (QID) | INTRAMUSCULAR | Status: DC | PRN
  Administered 2019-08-12: 4 mg via INTRAVENOUS
  Filled 2019-08-09: qty 2

## 2019-08-09 MED ORDER — VITAMIN B-12 1000 MCG PO TABS
1000.0000 ug | ORAL_TABLET | Freq: Every day | ORAL | Status: DC
  Administered 2019-08-10 – 2019-08-13 (×4): 1000 ug via ORAL
  Filled 2019-08-09 (×5): qty 1

## 2019-08-09 MED ORDER — METOPROLOL SUCCINATE ER 25 MG PO TB24
25.0000 mg | ORAL_TABLET | Freq: Every day | ORAL | Status: DC
  Administered 2019-08-10 – 2019-08-11 (×2): 25 mg via ORAL
  Filled 2019-08-09 (×3): qty 1

## 2019-08-09 MED ORDER — ACETAMINOPHEN 325 MG PO TABS
650.0000 mg | ORAL_TABLET | ORAL | Status: DC | PRN
  Administered 2019-08-10 – 2019-08-11 (×4): 650 mg via ORAL
  Filled 2019-08-09 (×4): qty 2

## 2019-08-09 MED ORDER — FUROSEMIDE 10 MG/ML IJ SOLN
60.0000 mg | Freq: Once | INTRAMUSCULAR | Status: AC
  Administered 2019-08-09: 60 mg via INTRAVENOUS
  Filled 2019-08-09: qty 8

## 2019-08-09 MED ORDER — SODIUM CHLORIDE 0.9% FLUSH: 3.0000 mL | INTRAVENOUS | Status: DC | PRN

## 2019-08-09 MED ORDER — ATORVASTATIN CALCIUM 20 MG PO TABS
20.0000 mg | ORAL_TABLET | Freq: Every day | ORAL | Status: DC
  Administered 2019-08-09 – 2019-08-13 (×5): 20 mg via ORAL
  Filled 2019-08-09 (×5): qty 1

## 2019-08-09 MED ORDER — POTASSIUM CHLORIDE CRYS ER 10 MEQ PO TBCR
10.0000 meq | EXTENDED_RELEASE_TABLET | Freq: Every day | ORAL | Status: DC
  Administered 2019-08-10 – 2019-08-13 (×4): 10 meq via ORAL
  Filled 2019-08-09 (×4): qty 1

## 2019-08-09 MED ORDER — IPRATROPIUM-ALBUTEROL 0.5-2.5 (3) MG/3ML IN SOLN: 3.0000 mL | Freq: Four times a day (QID) | RESPIRATORY_TRACT | Status: DC | PRN

## 2019-08-09 MED ORDER — MOMETASONE FURO-FORMOTEROL FUM 200-5 MCG/ACT IN AERO
2.0000 | INHALATION_SPRAY | Freq: Two times a day (BID) | RESPIRATORY_TRACT | Status: DC
  Administered 2019-08-10 – 2019-08-14 (×9): 2 via RESPIRATORY_TRACT
  Filled 2019-08-09 (×2): qty 8.8

## 2019-08-09 MED ORDER — SODIUM CHLORIDE 0.9 % IV SOLN
250.0000 mL | INTRAVENOUS | Status: DC | PRN
  Administered 2019-08-13: 250 mL via INTRAVENOUS

## 2019-08-09 NOTE — ED Notes (Signed)
Pt takes warfarin 1/2 tab each day of the week. Pharmacy notified and aware of PT/INR results. Waiting for pharmacy to determine if med is to be given.

## 2019-08-09 NOTE — ED Notes (Signed)
This RN message pharmacy to verify meds.

## 2019-08-09 NOTE — ED Provider Notes (Signed)
[facility] Emergency Department Provider Note  Time seen: 4:54 PM  I have reviewed the triage vital signs and the nursing notes.   HISTORY  Chief Complaint Shortness of Breath   HPI Sheryl Hughes is a 86 y.o. female with a past medical history of CAD, A. fib, CHF, gastric reflux, hypertension, presents to the emergency department for shortness of breath.  According to the patient since this morning she has been feeling short of breath, but then states she has been experiencing the symptoms for many months but her doctor will not put her on oxygen.  Denies any chest pain.   Also states she has had a mild cough but again states this has been occurring for approximately 6 months.  Patient had complained of visual hallucinations to the triage nurse however denies this to myself.  He states she has been feeling more weak but again states this has been an ongoing issue times months but felt more short of breath this morning.  Currently patient satting 99% on room air during my evaluation with a good waveform.  No apparent respiratory distress although the patient states she does feel short of breath with her mask on and is asking to take it off.  Past Medical History:  Diagnosis Date  . Atrial fibrillation ([place])   . CAD (coronary artery disease)   . CHF (congestive heart failure) ([place])   . CKD (chronic kidney disease)   . GERD (gastroesophageal reflux disease)   . Hypertension   . Pulmonary hypertension ([place])   . Shingles     Patient Active Problem List   Diagnosis Date Noted  . Atrial fibrillation ([place]) 08/05/2015  . Pulmonary hypertension ([place]) 08/05/2015  . Benign neoplasm of sigmoid colon   . Second degree hemorrhoids   . Diverticulosis of large intestine without diverticulitis   . Warfarin-induced coagulopathy ([place]) 12/05/2014  . Hypotension 12/05/2014  . Headache 12/05/2014  . Bilateral pleural effusion 10/21/2014  . Hyperkalemia 10/21/2014  . Elevated  troponin 10/17/2014  . CAD (coronary artery disease) 10/17/2014  . HLD (hyperlipidemia) 10/17/2014  . GERD (gastroesophageal reflux disease) 10/17/2014  . Chronic kidney disease (CKD), stage IV (severe) ([place]) 10/17/2014  . Chronic systolic congestive heart failure ([place]) 10/17/2014  . Constipation 10/17/2014    Past Surgical History:  Procedure Laterality Date  . BACK SURGERY    . CARDIAC CATHETERIZATION    . COLONOSCOPY WITH PROPOFOL N/A 12/08/2014   Procedure: COLONOSCOPY WITH PROPOFOL;  Surgeon: Lucilla Lame, MD;  Location: [facility] ENDOSCOPY;  Service: Endoscopy;  Laterality: N/A;  look into colon with scope  . CORONARY ARTERY BYPASS GRAFT    . ESOPHAGOGASTRODUODENOSCOPY (EGD) WITH PROPOFOL N/A 12/08/2014   Procedure: ESOPHAGOGASTRODUODENOSCOPY (EGD) WITH PROPOFOL;  Surgeon: Lucilla Lame, MD;  Location: [facility] ENDOSCOPY;  Service: Endoscopy;  Laterality: N/A;  Look into stomach with scope.  Marland Kitchen EYE SURGERY    . MASTECTOMY    . MITRAL VALVE REPAIR    . PACEMAKER PLACEMENT     defibrillator  . THORACENTESIS Bilateral     Prior to Admission medications   Medication Sig Start Date End Date Taking? Authorizing Provider  albuterol (PROVENTIL HFA;VENTOLIN HFA) 108 (90 Base) MCG/ACT inhaler Inhale 2 puffs into the lungs daily.    [provider]  aspirin EC 81 MG tablet Take 81 mg by mouth daily.    [provider]  atorvastatin (LIPITOR) 20 MG tablet Take 20 mg by mouth at bedtime.     [provider]  atorvastatin (LIPITOR) 20 MG tablet TAKE 1 TABLET BY MOUTH EVERY DAY 08/02/18   [provider]  Calcium Carb-Cholecalciferol (CALCIUM 600 + D PO) Take 1 tablet by mouth daily.    [provider]  Cholecalciferol (VITAMIN D) 2000 UNITS CAPS Take 2,000 Units by mouth daily.    [provider]  Cholecalciferol (VITAMIN D-1000 MAX ST) 25 MCG (1000 UT) tablet Take by mouth.    [provider]  docusate sodium (COLACE) 100 MG capsule Take 100  mg by mouth 2 (two) times daily.    [provider]  docusate sodium (COLACE) 100 MG capsule Take by mouth.    [provider]  dronedarone (MULTAQ) 400 MG tablet Take 1 tablet (400 mg total) by mouth every morning. Patient not taking: Reported on 10/25/2018 08/06/15   Theodoro Grist, MD  furosemide (LASIX) 40 MG tablet Take 40 mg by mouth daily.     [provider]  furosemide (LASIX) 40 MG tablet TAKE 1 TABLET BY MOUTH DAILY AND AN EXTRA TABLET AS NEEDED FOR SWELLING 06/06/18   [provider]  gabapentin (NEURONTIN) 100 MG capsule Take 100 mg by mouth 3 (three) times daily.     [provider]  gabapentin (NEURONTIN) 100 MG capsule TAKE 1 CAPSULE BY MOUTH UP TO THREE TIMES DAILY AS NEEDED FOR BACK PAIN 07/11/18   [provider]  metoprolol succinate (TOPROL-XL) 100 MG 24 hr tablet TAKE 1 TABLET BY MOUTH ONCE DAILY 04/18/18   [provider]  metoprolol succinate (TOPROL-XL) 25 MG 24 hr tablet Take 25 mg by mouth daily.     [provider]  pantoprazole (PROTONIX) 40 MG tablet Take 40 mg by mouth daily.     [provider]  pantoprazole (PROTONIX) 40 MG tablet TAKE 1 TABLET BY MOUTH TWICE DAILY 08/22/18   [provider]  polyethylene glycol (MIRALAX / GLYCOLAX) packet Take 17 g by mouth daily. 08/05/15   Theodoro Grist, MD  potassium chloride (K-DUR) 10 MEQ tablet Take 30 mEq by mouth daily.     [provider]  potassium chloride (K-DUR) 10 MEQ tablet Take by mouth.    [provider]  senna (SENOKOT) 8.6 MG TABS tablet Take 1 tablet (8.6 mg total) by mouth at bedtime. Patient not taking: Reported on 10/25/2018 08/05/15   Theodoro Grist, MD  vitamin B-12 (CYANOCOBALAMIN) 1000 MCG tablet Take 1,000 mcg by mouth daily.    [provider]  warfarin (COUMADIN) 5 MG tablet Take 5 mg by mouth daily. Take 1 tablet three times weekly and 1/2 a tablet four times weekly.    [provider]   warfarin (COUMADIN) 5 MG tablet TAKE 1 TABLET BY MOUTH ONCE DAILY, AS DIRECTED PER MD. 03/28/18   [provider]    Allergies  Allergen Reactions  . Biaxin [Clarithromycin] Other (See Comments)    Reaction:  Numbness of lips   . Flagyl [Metronidazole] Other (See Comments)    Reaction:  Numbness of lips   . Penicillins Rash and Other (See Comments)    Pt is unable to answer additional questions about this medication because it happened so long ago.  . Tetracyclines & Related Rash    Family History  Problem Relation Age of Onset  . Heart attack Mother   . Heart attack Father   . COPD Father   . Ulcers Father   . Atrial fibrillation Sister   . Lung cancer Sister   . Heart  attack Brother   . Aortic stenosis Brother   . CAD Brother     Social History Social History   Tobacco Use  . Smoking status: Never Smoker  . Smokeless tobacco: Never Used  Substance Use Topics  . Alcohol use: No    Alcohol/week: 0.0 standard drinks  . Drug use: No    Review of Systems Constitutional: Negative for fever. Cardiovascular: Negative for chest pain. Respiratory: Positive for shortness of breath.  Positive for cough.  Although both of which appear to be longer standing. Gastrointestinal: Negative for abdominal pain Musculoskeletal: Negative for musculoskeletal complaints Neurological: Negative for headache All other ROS negative  ____________________________________________   PHYSICAL EXAM:  VITAL SIGNS: ED Triage Vitals  Enc Vitals Group     BP 08/09/19 1530 120/71     Pulse Rate 08/09/19 1534 71     Resp 08/09/19 1534 18     Temp 08/09/19 1540 98.1 F (36.7 C)     Temp Source 08/09/19 1540 Oral     SpO2 08/09/19 1534 100 %     Weight 08/09/19 1534 8 lb 9.6 oz (3.901 kg)     Height 08/09/19 1534 5\' 6"  (1.676 m)     Head Circumference --      Peak Flow --      Pain Score --      Pain Loc --      Pain Edu? --      Excl. in [place]? --    Constitutional: Alert and  oriented. Well appearing and in no distress. Eyes: Normal exam ENT      Head: Normocephalic and atraumatic.      Mouth/Throat: Mucous membranes are moist. Cardiovascular: Normal rate, regular rhythm.  Respiratory: Normal respiratory effort without tachypnea nor retractions. Breath sounds are clear  Gastrointestinal: Soft and nontender. No distention.  Musculoskeletal: Nontender with normal range of motion in all extremities. Neurologic:  Normal speech and language. No gross focal neurologic deficits Skin:  Skin is warm, dry and intact.  Psychiatric: Mood and affect are normal. ____________________________________________    EKG  EKG viewed and interpreted by myself shows a ventricular paced rhythm at 71 bpm with a widened QRS, nonspecific ST changes.  ____________________________________________    RADIOLOGY  Chest x-ray shows chronic changes without acute abnormality.  ____________________________________________   INITIAL IMPRESSION / ASSESSMENT AND PLAN / ED COURSE  Pertinent labs & imaging results that were available during my care of the patient were reviewed by me and considered in my medical decision making (see chart for details).   Patient presents to the emergency department for shortness of breath, states this has been an ongoing issue for months but worse today.  States her doctor will not put her on oxygen.  Denies any chest pain does state a cough but states that has been going on for at least 6 months per patient.  Overall the patient appears well, no distress.  Physical exam is reassuring, vital signs are reassuring.  We will check labs and continue to closely monitor while awaiting results.  Patient agreeable to plan of care.  Currently satting 99% on room air with clear lung sounds.  Patient's BNP is elevated greater than 3000 troponin elevated to 39.  Patient states she has seen her doctor and they have increased her Lasix over the past 1 month she is currently  up to 80 mg/day.  Given the worsening shortness of breath elevated BNP despite recent increases in Lasix and on essentially failure of  home therapy we will admit the patient to the hospital service for diuresis and further treatment.  MARDEE CLUNE was evaluated in Emergency Department on 08/09/2019 for the symptoms described in the history of present illness. She was evaluated in the context of the global COVID-19 pandemic, which necessitated consideration that the patient might be at risk for infection with the SARS-CoV-2 virus that causes COVID-19. Institutional protocols and algorithms that pertain to the evaluation of patients at risk for COVID-19 are in a state of rapid change based on information released by regulatory bodies including the CDC and federal and state organizations. These policies and algorithms were followed during the patient's care in the ED.  ____________________________________________   FINAL CLINICAL IMPRESSION(S) / ED DIAGNOSES  Shortness of breath CHF exacerbation   Harvest Dark, MD 08/09/19 [phone]

## 2019-08-09 NOTE — ED Notes (Signed)
Pt to xray

## 2019-08-09 NOTE — ED Notes (Signed)
Pt resting with lights off in NAD. TV on. Pt has call bell. Denies any needs at this time.

## 2019-08-09 NOTE — Consult Note (Signed)
Messaged RN to have INR drawn. Med rec tech states patient takes warfarin 2.5 mg daily. Will verify warfarin once INR is drawn.   Eleonore Chiquito, PharmD, BCPS

## 2019-08-09 NOTE — H&P (Signed)
History and Physical    Sheryl Hughes QIW:[id number] DOB: [birth year] DOA: 08/09/2019  PCP: Kirk Ruths, MD   Patient coming from: Home  I have personally briefly reviewed patient's old medical records in [place]  Chief Complaint: Shortness of breath  HPI: Sheryl Hughes is a 86 y.o. female with medical history significant for paroxysmal atrial fibrillation, chronic systolic dysfunction CHF with last known LVEF of 20%, coronary artery disease, stage IV chronic kidney disease, essential hypertension, COPD and pulmonary hypertension who presents to the emergency room for evaluation of shortness of breath.  Patient states that her symptoms have been going on for about a month and she has had her diuretic dose increased without any significant improvement in her symptoms.  She is concerned that she may need oxygen.  Shortness of breath is associated with lower extremity swelling but she denies having cough, chest pain, fever or chills. Chest x-ray shows chronic emphysematous changes and pulmonary scarring but no definite acute overlying pulmonary process. Small effusions. Twelve-lead EKG shows a ventricular paced rhythm Labs reveal elevated troponin level as well as BNP level  ED Course: Patient is an 86 year old female with multiple medical problems who presents to the emergency room for evaluation of worsening shortness of breath.  Symptoms have been persistent despite an increase in the dose of her diuretic therapy.  Chest x-ray shows small effusions.  Patient will be admitted to the hospital for further evaluation.  Review of Systems: As per HPI otherwise 10 point review of systems negative.    Past Medical History:  Diagnosis Date  . Atrial fibrillation ([place])   . CAD (coronary artery disease)   . CHF (congestive heart failure) ([place])   . CKD (chronic kidney disease)   . GERD (gastroesophageal reflux disease)   . Hypertension   . Pulmonary hypertension ([place])   . Shingles      Past Surgical History:  Procedure Laterality Date  . BACK SURGERY    . CARDIAC CATHETERIZATION    . COLONOSCOPY WITH PROPOFOL N/A 12/08/2014   Procedure: COLONOSCOPY WITH PROPOFOL;  Surgeon: Lucilla Lame, MD;  Location: [facility] ENDOSCOPY;  Service: Endoscopy;  Laterality: N/A;  look into colon with scope  . CORONARY ARTERY BYPASS GRAFT    . ESOPHAGOGASTRODUODENOSCOPY (EGD) WITH PROPOFOL N/A 12/08/2014   Procedure: ESOPHAGOGASTRODUODENOSCOPY (EGD) WITH PROPOFOL;  Surgeon: Lucilla Lame, MD;  Location: [facility] ENDOSCOPY;  Service: Endoscopy;  Laterality: N/A;  Look into stomach with scope.  Marland Kitchen EYE SURGERY    . MASTECTOMY    . MITRAL VALVE REPAIR    . PACEMAKER PLACEMENT     defibrillator  . THORACENTESIS Bilateral      reports that she has never smoked. She has never used smokeless tobacco. She reports that she does not drink alcohol or use drugs.  Allergies  Allergen Reactions  . Biaxin [Clarithromycin] Other (See Comments)    Reaction:  Numbness of lips   . Flagyl [Metronidazole] Other (See Comments)    Reaction:  Numbness of lips   . Penicillins Rash and Other (See Comments)    Pt is unable to answer additional questions about this medication because it happened so long ago.  . Tetracyclines & Related Rash    Family History  Problem Relation Age of Onset  . Heart attack Mother   . Heart attack Father   . COPD Father   . Ulcers Father   . Atrial fibrillation Sister   . Lung cancer Sister   . Heart  attack Brother   . Aortic stenosis Brother   . CAD Brother      Prior to Admission medications   Medication Sig Start Date End Date Taking? Authorizing Provider  albuterol (PROVENTIL HFA;VENTOLIN HFA) 108 (90 Base) MCG/ACT inhaler Inhale 2 puffs into the lungs daily.    [provider]  aspirin EC 81 MG tablet Take 81 mg by mouth daily.    [provider]  atorvastatin (LIPITOR) 20 MG tablet Take 20 mg by mouth at bedtime.     [provider]    atorvastatin (LIPITOR) 20 MG tablet TAKE 1 TABLET BY MOUTH EVERY DAY 08/02/18   [provider]  Calcium Carb-Cholecalciferol (CALCIUM 600 + D PO) Take 1 tablet by mouth daily.    [provider]  Cholecalciferol (VITAMIN D) 2000 UNITS CAPS Take 2,000 Units by mouth daily.    [provider]  Cholecalciferol (VITAMIN D-1000 MAX ST) 25 MCG (1000 UT) tablet Take by mouth.    [provider]  docusate sodium (COLACE) 100 MG capsule Take 100 mg by mouth 2 (two) times daily.    [provider]  docusate sodium (COLACE) 100 MG capsule Take by mouth.    [provider]  dronedarone (MULTAQ) 400 MG tablet Take 1 tablet (400 mg total) by mouth every morning. Patient not taking: Reported on 10/25/2018 08/06/15   Theodoro Grist, MD  furosemide (LASIX) 40 MG tablet Take 40 mg by mouth daily.     [provider]  furosemide (LASIX) 40 MG tablet TAKE 1 TABLET BY MOUTH DAILY AND AN EXTRA TABLET AS NEEDED FOR SWELLING 06/06/18   [provider]  gabapentin (NEURONTIN) 100 MG capsule Take 100 mg by mouth 3 (three) times daily.     [provider]  gabapentin (NEURONTIN) 100 MG capsule TAKE 1 CAPSULE BY MOUTH UP TO THREE TIMES DAILY AS NEEDED FOR BACK PAIN 07/11/18   [provider]  metoprolol succinate (TOPROL-XL) 100 MG 24 hr tablet TAKE 1 TABLET BY MOUTH ONCE DAILY 04/18/18   [provider]  metoprolol succinate (TOPROL-XL) 25 MG 24 hr tablet Take 25 mg by mouth daily.     [provider]  pantoprazole (PROTONIX) 40 MG tablet Take 40 mg by mouth daily.     [provider]  pantoprazole (PROTONIX) 40 MG tablet TAKE 1 TABLET BY MOUTH TWICE DAILY 08/22/18   [provider]  polyethylene glycol (MIRALAX / GLYCOLAX) packet Take 17 g by mouth daily. 08/05/15   Theodoro Grist, MD  potassium chloride (K-DUR) 10 MEQ tablet Take 30 mEq by mouth daily.     [provider]  potassium chloride  (K-DUR) 10 MEQ tablet Take by mouth.    [provider]  senna (SENOKOT) 8.6 MG TABS tablet Take 1 tablet (8.6 mg total) by mouth at bedtime. Patient not taking: Reported on 10/25/2018 08/05/15   Theodoro Grist, MD  vitamin B-12 (CYANOCOBALAMIN) 1000 MCG tablet Take 1,000 mcg by mouth daily.    [provider]  warfarin (COUMADIN) 5 MG tablet Take 5 mg by mouth daily. Take 1 tablet three times weekly and 1/2 a tablet four times weekly.    [provider]  warfarin (COUMADIN) 5 MG tablet TAKE 1 TABLET BY MOUTH ONCE DAILY, AS DIRECTED PER MD. 03/28/18   [provider]    Physical Exam: Vitals:   08/09/19 1530 08/09/19 1534 08/09/19 1540 08/09/19 1540  BP: 120/71 120/71    Pulse:  71  71   Resp:  18    Temp:    98.1 F (36.7 C)  TempSrc:    Oral  SpO2:  100% 99%   Weight:  3.901 kg    Height:  5\' 6"  (1.676 m)       Vitals:   08/09/19 1530 08/09/19 1534 08/09/19 1540 08/09/19 1540  BP: 120/71 120/71    Pulse:  71 71   Resp:  18    Temp:    98.1 F (36.7 C)  TempSrc:    Oral  SpO2:  100% 99%   Weight:  3.901 kg    Height:  5\' 6"  (1.676 m)      Constitutional: NAD, alert and oriented x 3. Frail Eyes: PERRL, lids and conjunctivae normal ENMT: Mucous membranes are moist.  Neck: normal, supple, no masses, no thyromegaly Respiratory: Air movement in all lung fields, no wheezing, no crackles. Normal respiratory effort. No accessory muscle use.  Cardiovascular: Regular rate and rhythm, no murmurs / rubs / gallops. trace extremity edema. 2+ pedal pulses. No carotid bruits.  Abdomen: no tenderness, no masses palpated. No hepatosplenomegaly. Bowel sounds positive.  Musculoskeletal: no clubbing / cyanosis. No joint deformity upper and lower extremities.  Skin: no rashes, lesions, ulcers.  Neurologic: No gross focal neurologic deficit. Psychiatric: Normal mood and affect.   Labs on Admission: I have personally reviewed following labs and imaging  studies  CBC: Recent Labs  Lab 08/09/19 1548  WBC 6.1  NEUTROABS 4.5  HGB 11.8*  HCT 36.8  MCV 91.5  PLT 355   Basic Metabolic Panel: Recent Labs  Lab 08/09/19 1548  NA 135  K 4.8  CL 90*  CO2 37*  GLUCOSE 100*  BUN 36*  CREATININE 1.30*  CALCIUM 9.0   GFR: Estimated Creatinine Clearance: 1.9 mL/min (A) (by C-G formula based on SCr of 1.3 mg/dL (H)). Liver Function Tests: Recent Labs  Lab 08/09/19 1548  AST 34  ALT 16  ALKPHOS 90  BILITOT 0.7  PROT 7.0  ALBUMIN 3.7   No results for input(s): LIPASE, AMYLASE in the last 168 hours. No results for input(s): AMMONIA in the last 168 hours. Coagulation Profile: No results for input(s): INR, PROTIME in the last 168 hours. Cardiac Enzymes: No results for input(s): CKTOTAL, CKMB, CKMBINDEX, TROPONINI in the last 168 hours. BNP (last 3 results) No results for input(s): PROBNP in the last 8760 hours. HbA1C: No results for input(s): HGBA1C in the last 72 hours. CBG: No results for input(s): GLUCAP in the last 168 hours. Lipid Profile: No results for input(s): CHOL, HDL, LDLCALC, TRIG, CHOLHDL, LDLDIRECT in the last 72 hours. Thyroid Function Tests: No results for input(s): TSH, T4TOTAL, FREET4, T3FREE, THYROIDAB in the last 72 hours. Anemia Panel: No results for input(s): VITAMINB12, FOLATE, FERRITIN, TIBC, IRON, RETICCTPCT in the last 72 hours. Urine analysis:    Component Value Date/Time   COLORURINE YELLOW (A) 08/09/2019 1822   APPEARANCEUR CLEAR (A) 08/09/2019 1822   APPEARANCEUR Clear 02/18/2013 1238   LABSPEC 1.009 08/09/2019 1822   LABSPEC 1.003 02/18/2013 1238   PHURINE 5.0 08/09/2019 1822   GLUCOSEU NEGATIVE 08/09/2019 1822   GLUCOSEU Negative 02/18/2013 1238   HGBUR NEGATIVE 08/09/2019 1822   BILIRUBINUR NEGATIVE 08/09/2019 1822   BILIRUBINUR Negative 02/18/2013 1238   KETONESUR NEGATIVE 08/09/2019 1822   PROTEINUR NEGATIVE 08/09/2019 1822   NITRITE NEGATIVE 08/09/2019 1822   LEUKOCYTESUR  NEGATIVE 08/09/2019 1822   LEUKOCYTESUR Negative 02/18/2013 1238    Radiological Exams on  Admission: DG Chest 2 View  Result Date: 08/09/2019 CLINICAL DATA:  Cough.  Chronic shortness of breath. EXAM: CHEST - 2 VIEW COMPARISON:  Chest x-ray 07/31/2015 and chest CT 11/04/2018 FINDINGS: The heart is mildly enlarged but stable. Stable tortuosity and calcification of the thoracic aorta. Stable pacer wires. Stable surgical changes from bypass surgery. Stable emphysematous changes and pulmonary scarring. No definite acute overlying pulmonary process. No pneumothorax. Suspect small effusions. IMPRESSION: Chronic emphysematous changes and pulmonary scarring but no definite acute overlying pulmonary process. Small effusions. Electronically Signed   By: Marijo Sanes M.D.   On: 08/09/2019 16:36    EKG: Independently reviewed.  Ventricular paced rhythm  Assessment/Plan Principal Problem:   Acute on chronic systolic CHF (congestive heart failure) (HCC) Active Problems:   CAD (coronary artery disease)   Chronic kidney disease (CKD), stage IV (severe) (HCC)   Atrial fibrillation (HCC)   Pulmonary hypertension (HCC)    Acute on chronic systolic CHF Patient presents for evaluation of worsening shortness of breath which may be multifactorial and related to CHF as well as underlying COPD We will place patient on Lasix 40 mg IV every 12 Continue beta-blockers Obtain 2D echocardiogram to assess LVEF   Paroxysmal atrial fibrillation Rate controlled  Continue beta-blockers Patient is on long-term anticoagulation with Coumadin as primary prophylaxis for an acute stroke Daily PT/INR   Stage IV chronic kidney disease Patient has no uremic symptoms Monitor renal function closely We will request nephrology consult if patient has worsening of her renal function with diuretics   COPD Probably the etiology for her worsening shortness of breath Continue PRN bronchodilator therapy Add inhaled  steroids Assess patient for home oxygen need prior to discharge   DVT prophylaxis: Patient is on Coumadin Code Status: DNR  Family Communication: Greater than 50% of time was spent discussing plan of care with patient and her daughter at the bedside. All questions and concerns have been addressed. They verbalize understanding and agree with the plan Disposition Plan: Back to previous home environment Consults called: None    Arth Nicastro MD [facility]     08/09/2019, 6:34 PM

## 2019-08-09 NOTE — ED Triage Notes (Addendum)
Pt from home via [facility]. Per EMS, pt has c/o of breathing difficulties for a month but this morning "cannot move around w/o feeling like I'm gasping for air". Dry cough for "months". Md CP "only when I cough".    Pt sating at 98% RA upon arrival. Pt c/o dizziness, weak, hallucinations "I see a person standing and moving".   St weakness "all over and just not feeling well". Worsening of symptoms over the last month". Pt A/Ox4.   VS EMS: 98/60; 95%; Co2 36/55; 98.5 Oral temp.

## 2019-08-09 NOTE — ED Notes (Signed)
Patient in C-Pod in ED waiting for a bed. Will monitor patient per MEWS protocol.    08/09/19 2030  Assess: MEWS Score  Temp 98.1 F (36.7 C)  BP (!) 100/52  Pulse Rate 89  Resp (!) 24  Level of Consciousness Alert  SpO2 100 %  O2 Device Room Air  O2 Flow Rate (L/min) 2 L/min  Assess: MEWS Score  MEWS Temp 0  MEWS Systolic 1  MEWS Pulse 0  MEWS RR 1  MEWS LOC 0  MEWS Score 2  MEWS Score Color Yellow  Assess: if the MEWS score is Yellow or Red  Were vital signs taken at a resting state? Yes  Focused Assessment Documented focused assessment  Early Detection of Sepsis Score *See Row Information* Low  MEWS guidelines implemented *See Row Information* Yes  Treat  MEWS Interventions Administered prn meds/treatments  Take Vital Signs  Increase Vital Sign Frequency  Yellow: Q 2hr X 2 then Q 4hr X 2, if remains yellow, continue Q 4hrs  Escalate  MEWS: Escalate Yellow: discuss with charge nurse/RN and consider discussing with provider and RRT  Notify: Charge Nurse/RN  Name of Charge Nurse/RN Notified Raquel, RN  Date Charge Nurse/RN Notified 08/09/19  Time Charge Nurse/RN Notified 2040  Document  Patient Outcome Stabilized after interventions  Progress note created (see row info) Yes

## 2019-08-10 ENCOUNTER — Inpatient Hospital Stay
Admit: 2019-08-10 | Discharge: 2019-08-10 | Disposition: A | Discharge status: Home / Self Care | Payer: Medicare Other | Attending: Internal Medicine | Admitting: Internal Medicine

## 2019-08-10 ENCOUNTER — Inpatient Hospital Stay: Payer: Medicare Other

## 2019-08-10 DIAGNOSIS — J449 Chronic obstructive pulmonary disease, unspecified: Secondary | ICD-10-CM | POA: Diagnosis present

## 2019-08-10 LAB — BASIC METABOLIC PANEL
Anion gap: 8 (ref 5–15)
BUN: 32 mg/dL — ABNORMAL HIGH (ref 8–23)
CO2: 36 mmol/L — ABNORMAL HIGH (ref 22–32)
Calcium: 8.6 mg/dL — ABNORMAL LOW (ref 8.9–10.3)
Chloride: 95 mmol/L — ABNORMAL LOW (ref 98–111)
Creatinine, Ser: 1.29 mg/dL — ABNORMAL HIGH (ref 0.44–1.00)
GFR calc Af Amer: 44 mL/min — ABNORMAL LOW (ref >60)
GFR calc non Af Amer: 38 mL/min — ABNORMAL LOW (ref >60)
Glucose, Bld: 97 mg/dL (ref 70–99)
Potassium: 4.7 mmol/L (ref 3.5–5.1)
Sodium: 139 mmol/L (ref 135–145)

## 2019-08-10 LAB — MAGNESIUM: Magnesium: 2.2 mg/dL (ref 1.7–2.4)

## 2019-08-10 LAB — ECHOCARDIOGRAM COMPLETE
Height: 66 in
Weight: 137.6 oz

## 2019-08-10 LAB — PROTIME-INR
INR: 2.3 — ABNORMAL HIGH (ref 0.8–1.2)
Prothrombin Time: 24.6 seconds — ABNORMAL HIGH (ref 11.4–15.2)

## 2019-08-10 MED ORDER — NYSTATIN 100000 UNIT/GM EX CREA
TOPICAL_CREAM | Freq: Two times a day (BID) | CUTANEOUS | Status: DC
  Filled 2019-08-10 (×2): qty 15

## 2019-08-10 MED ORDER — METHOCARBAMOL 500 MG PO TABS
500.0000 mg | ORAL_TABLET | Freq: Once | ORAL | Status: AC
  Administered 2019-08-10: 500 mg via ORAL
  Filled 2019-08-10: qty 1

## 2019-08-10 MED ORDER — METHOCARBAMOL 500 MG PO TABS
500.0000 mg | ORAL_TABLET | Freq: Three times a day (TID) | ORAL | Status: DC | PRN
  Administered 2019-08-10: 500 mg via ORAL
  Filled 2019-08-10 (×2): qty 1

## 2019-08-10 NOTE — TOC Initial Note (Signed)
Transition of Care [facility]) - Initial/Assessment Note    Patient Details  Name: Sheryl Hughes MRN: [id number] Date of Birth: [birth year]  [facility] CM/SW Contact:    Candie Chroman, LCSW Phone Number: 08/10/2019, 3:41 PM  Clinical Narrative: Readmission prevention screen complete. CSW met with patient. Daughter at bedside. CSW introduced role and explained that discharge planning would be discussed. Patient lives home with her husband who has dementia. She has personal care services through [facility]. They come to the home twice per week for about 4 hours. Daughter said they are working on increasing to five days per week. The aide prepares meals, cleans, bathes patient if needed, etc. Daughter stated the family has been working on placing patient and her husband in an ALF at some point. Patient has a long-term care insurance policy but they have denied her twice for extra services. Patient's PCP is Frazier Richards, MD at [facility]. Daughter-in-law takes her to appts. Pharmacy is [facility] in [place]. No issues obtaining medications. No home health therapy or RN prior to admission. Patient uses a walker and cane at home which she has been using more frequently lately. Patient is not on oxygen at home. Daughter said there have been several times where she hasn't qualified. She does not have a home cpap machine. CSW asked MD for PT and OT orders to see if patient qualifies for home health services. CSW provided daughter with CMS scores for home health agencies that serve her zip code and a list of [facility] ALF's. No further concerns. CSW encouraged patient and her daughter to contact CSW as needed. TOC team will continue to follow patient for support and facilitate return home when stable.                 Expected Discharge Plan: [place] Barriers to Discharge: Continued Medical Work up   Patient Goals and CMS Choice   CMS Medicare.gov  Compare Post Acute Care list provided to:: Patient(Daughter at bedside.)    Expected Discharge Plan and Services Expected Discharge Plan: [place] Choice: [place] arrangements for the past 2 months: Single Family Home                                      Prior Living Arrangements/Services Living arrangements for the past 2 months: Single Family Home Lives with:: Spouse Patient language and need for interpreter reviewed:: Yes Do you feel safe going back to the place where you live?: Yes      Need for Family Participation in Patient Care: Yes (Comment) Care giver support system in place?: Yes (comment) Current home services: DME, Other (comment)(Personal Care Services) Criminal Activity/Legal Involvement Pertinent to Current Situation/Hospitalization: No - Comment as needed  Activities of Daily Living Home Assistive Devices/Equipment: Gilford Rile (specify type) ADL Screening (condition at time of admission) Patient's cognitive ability adequate to safely complete daily activities?: Yes Is the patient deaf or have difficulty hearing?: No Does the patient have difficulty seeing, even when wearing glasses/contacts?: No Does the patient have difficulty concentrating, remembering, or making decisions?: No Patient able to express need for assistance with ADLs?: Yes Does the patient have difficulty dressing or bathing?: No Independently performs ADLs?: Yes (appropriate for developmental age) Does the patient have difficulty walking or climbing stairs?: No Weakness  of Legs: None Weakness of Arms/Hands: None  Permission Sought/Granted Permission sought to share information with : Family Supports Permission granted to share information with : Yes, Verbal Permission Granted  Share Information with NAME: Ardeth Sportsman     Permission granted to share info w Relationship: Daughter  Permission granted to share info w Contact Information:  [phone]  Emotional Assessment Appearance:: Appears stated age Attitude/Demeanor/Rapport: Engaged, Gracious Affect (typically observed): Accepting, Appropriate, Calm, Pleasant Orientation: : Oriented to Self, Oriented to Place, Oriented to  Time, Oriented to Situation Alcohol / Substance Use: Not Applicable Psych Involvement: No (comment)  Admission diagnosis:  Edema [R60.9] Acute on chronic systolic (congestive) heart failure (HCC) [I50.23] Dyspnea, unspecified type [R06.00] Acute on chronic congestive heart failure, unspecified heart failure type [facility]) [I50.9] Patient Active Problem List   Diagnosis Date Noted  . Acute on chronic systolic CHF (congestive heart failure) ([place]) 08/09/2019  . Acute on chronic systolic (congestive) heart failure ([place]) 08/09/2019  . Atrial fibrillation ([place]) 08/05/2015  . Pulmonary hypertension ([place]) 08/05/2015  . Benign neoplasm of sigmoid colon   . Second degree hemorrhoids   . Diverticulosis of large intestine without diverticulitis   . Warfarin-induced coagulopathy ([place]) 12/05/2014  . Hypotension 12/05/2014  . Headache 12/05/2014  . Bilateral pleural effusion 10/21/2014  . Hyperkalemia 10/21/2014  . Elevated troponin 10/17/2014  . CAD (coronary artery disease) 10/17/2014  . HLD (hyperlipidemia) 10/17/2014  . GERD (gastroesophageal reflux disease) 10/17/2014  . Chronic kidney disease (CKD), stage IV (severe) ([place]) 10/17/2014  . Chronic systolic congestive heart failure ([place]) 10/17/2014  . Constipation 10/17/2014   PCP:  Kirk Ruths, MD Pharmacy:   [facility] [place], [address] [place] [address] Phone: [phone] Fax: [phone]     Social Determinants of Health (SDOH) Interventions    Readmission Risk Interventions Readmission Risk Prevention Plan 08/10/2019  Transportation Screening Complete  PCP or Specialist Appt within 3-5 Days  Complete  HRI or [place] Complete  Social Work Consult for [place] Planning/Counseling Complete  Palliative Care Screening Not Applicable  Medication Review Press [employer]) Complete  Some recent data might be hidden

## 2019-08-10 NOTE — ED Notes (Signed)
Pt resting in bed with no complaints, warm blanket given per request. Oriented and alert, bed locked and low, call light in reach.

## 2019-08-10 NOTE — Progress Notes (Signed)
Brief Cross Cover Note Nurse reports marginal blood pressures and held metoprolol and lasix.  Also reports patient with new oxygen requirement with decreased oxygen saturation on room air at about 10 pm.  Bedside patient is asymptomatic. denies dizziness or shortness of breath. Patient confirms was being treated for increased leg swelling with increased lasix doses at home.  She denies that she was experiencing any increased shortness of breath or difficulty breathing when lying in bed.  She endorses good urine output post lasix given on arrival but no intake and output documentation to confirm.   She also reports being treated for tinea pedis on right foot Inspection of legs/feet - + fungal areas on right foot.  Also noted purpura spots which patient reports as not knew and related to peripheral vascular disease.  Bluish coloration to toes (Raynaud's disease.) Right foot more edematous than left.  Red/maroon patch area right anterior shin in which patient reports is new as of last couple days.  Denies known injury to area.  Some tightness reported with foot dorsiflexion of right foot.  Difficulty obtaining DP pulse of right foot even with doppler but toes equal in temperature with left. Heart rhythm a fib rate in 70-80 with PVC's on bedside monitor INR subtherapeutic  Doppler of RLE ordered Current BP with MEAN above 65

## 2019-08-10 NOTE — Progress Notes (Signed)
*  PRELIMINARY RESULTS* Echocardiogram 2D Echocardiogram has been performed.  Sheryl Hughes 08/10/2019, 11:05 AM

## 2019-08-10 NOTE — Progress Notes (Signed)
PROGRESS NOTE    Sheryl Hughes   GXQ:[id number]  DOB: 1931-08-30  PCP: Kirk Ruths, MD    DOA: 08/09/2019 LOS: 1   Brief Narrative   Sheryl Hughes is a 86 y.o. female with medical history of paroxysmal atrial fibrillation, chronic systolic dysfunction CHF with last known LVEF of 20%, coronary artery disease, stage IV chronic kidney disease, essential hypertension, COPD and pulmonary hypertension who presented to the ED on 08/09/19 for evaluation of shortness of breath and lower extremity swelling.  She reported recent increased diuretic doses in outpatient setting, but has not noted improvement.  She reports recently that she is sleeping a lot and has no energy, wonders if she needs supplemental oxygen.  Chest xray showed chronic emphysematous changes and pulmonary scarring but no definite acute overlying pulmonary process. Small effusions.  EKG showed a ventricular paced rhythm.  Labs reveal mildly elevated troponin level as well as BNP level > 3000.  Admitted to hospitalist service with cardiology consulted, undergoing IV diuresis.     Assessment & Plan   Principal Problem:   Acute on chronic systolic CHF (congestive heart failure) (HCC) Active Problems:   Atrial fibrillation (HCC)   Chronic kidney disease (CKD), stage IV (severe) (HCC)   Pulmonary hypertension (HCC)   COPD (chronic obstructive pulmonary disease) (HCC)   CAD (coronary artery disease)   Acute on chronic systolic CHF - POA with SOB/DOE and edema.   --Cardiology following --Continue IV diuresis --follow up echo --strict I/O's and daily weights --monitor renal function & electrolytes closely   Chronic Atrial fibrillation - rate controlled.  Continue warfarin for anticoagulation, pharmacy consulted for dosing.  Daily INR's.  Continue Toprol XL.  CKD stage IV (severe) - no uremic symptoms.  Monitor renal function closely with diuresis, consider nephrology consult if Cr rising.  Avoid nephrotoxin and hypotension  as much as possible.    Pulmonary hypertension - contributes to primary problem above.  Monitor.  Supplemental O2 as needed to keep O2 sat > 90%.  COPD - not acutely exacerbated, has no wheezing on exam, but does contribute to her symptoms.  Appears not to be on bronchodilators outpatient.  Dulera BID.  Duonebs PRN.    Coronary Artery Disease - stable, no active chest pain.  Continue ASA and statin.  GERD - continue PPI  Foot cramping - electrolytes are okay, unclear cause.  Onset this afternoon.  Improved with trial of Robaxin, ordered PRN.  DVT prophylaxis: on coumadin  Diet:  Diet Orders (From admission, onward)    Start     Ordered   08/09/19 1839  Diet 2 gram sodium Room service appropriate? Yes; Fluid consistency: Thin  Diet effective now    Question Answer Comment  Room service appropriate? Yes   Fluid consistency: Thin      08/09/19 1840            Code Status: DNR    Subjective 08/10/19    Patient seen this AM.  Reports feeling a little better.  Says she has been sleeping a lot lately, and has no energy.  She wonders if she needs oxygen.  Denies chest pain.  No SOB at rest.  Has not been up yet so unsure if DOE is better.  No fevers or chills.     Disposition Plan & Communication   Status is: Inpatient  Remains inpatient appropriate because:IV treatments appropriate due to intensity of illness or inability to take PO   Dispo: The patient is  from: Home              Anticipated d/c is to: Home              Anticipated d/c date is: 2 days              Patient currently is not medically stable to d/c.   Family Communication: none at bedside, will attempt to call    Consults, Procedures, Significant Events   Consultants:   Cardiology  Procedures:   2D Echo 08/09/19   Objective   Vitals:   08/10/19 0800 08/10/19 0826 08/10/19 1258 08/10/19 1546  BP:  (!) 101/55 (!) 102/53 103/60  Pulse:  88 93 71  Resp:   17 16  Temp:   97.6 F (36.4 C) 98.4 F  (36.9 C)  TempSrc:   Oral Oral  SpO2:   100% 100%  Weight: 39.9 kg  38.1 kg   Height:   5\' 4"  (1.626 m)     Intake/Output Summary (Last 24 hours) at 08/10/2019 1634 Last data filed at 08/10/2019 1000 Gross per 24 hour  Intake 120 ml  Output --  Net 120 ml   Filed Weights   08/09/19 1534 08/10/19 0800 08/10/19 1258  Weight: 3.901 kg 39.9 kg 38.1 kg    Physical Exam:  General exam: awake, alert, no acute distress Respiratory system: decreased breath sounds, no wheezes or rhonchi, normal respiratory effort. Cardiovascular system: normal S1/S2, irregular rhythm, regular rate, no pedal edema.   Gastrointestinal system: soft, NT, ND, no HSM felt, +bowel sounds. Central nervous system: A&O x4. no gross focal neurologic deficits, normal speech Extremities: moves all, no edema, normal tone, right foot erythema due to fungus, distal left lower extremity ecchymosis and erythema (chronic per pt) Skin: dry, intact, normal temperature, normal color Psychiatry: normal mood, congruent affect, judgement and insight appear normal  Labs   Data Reviewed: I have personally reviewed following labs and imaging studies  CBC: Recent Labs  Lab 08/09/19 1548  WBC 6.1  NEUTROABS 4.5  HGB 11.8*  HCT 36.8  MCV 91.5  PLT 158   Basic Metabolic Panel: Recent Labs  Lab 08/09/19 1548 08/10/19 0334  NA 135 139  K 4.8 4.7  CL 90* 95*  CO2 37* 36*  GLUCOSE 100* 97  BUN 36* 32*  CREATININE 1.30* 1.29*  CALCIUM 9.0 8.6*  MG  --  2.2   GFR: Estimated Creatinine Clearance: 19.2 mL/min (A) (by C-G formula based on SCr of 1.29 mg/dL (H)). Liver Function Tests: Recent Labs  Lab 08/09/19 1548  AST 34  ALT 16  ALKPHOS 90  BILITOT 0.7  PROT 7.0  ALBUMIN 3.7   No results for input(s): LIPASE, AMYLASE in the last 168 hours. No results for input(s): AMMONIA in the last 168 hours. Coagulation Profile: Recent Labs  Lab 08/09/19 2010 08/10/19 0334  INR 2.3* 2.3*   Cardiac Enzymes: No results  for input(s): CKTOTAL, CKMB, CKMBINDEX, TROPONINI in the last 168 hours. BNP (last 3 results) No results for input(s): PROBNP in the last 8760 hours. HbA1C: No results for input(s): HGBA1C in the last 72 hours. CBG: No results for input(s): GLUCAP in the last 168 hours. Lipid Profile: No results for input(s): CHOL, HDL, LDLCALC, TRIG, CHOLHDL, LDLDIRECT in the last 72 hours. Thyroid Function Tests: No results for input(s): TSH, T4TOTAL, FREET4, T3FREE, THYROIDAB in the last 72 hours. Anemia Panel: No results for input(s): VITAMINB12, FOLATE, FERRITIN, TIBC, IRON, RETICCTPCT in the last 72  hours. Sepsis Labs: No results for input(s): PROCALCITON, LATICACIDVEN in the last 168 hours.  Recent Results (from the past 240 hour(s))  SARS Coronavirus 2 by RT PCR (hospital order, performed in [facility] hospital lab) Nasopharyngeal Nasopharyngeal Swab     Status: None   Collection Time: 08/09/19  8:10 PM   Specimen: Nasopharyngeal Swab  Result Value Ref Range Status   SARS Coronavirus 2 NEGATIVE NEGATIVE Final    Comment: (NOTE) SARS-CoV-2 target nucleic acids are NOT DETECTED. The SARS-CoV-2 RNA is generally detectable in upper and lower respiratory specimens during the acute phase of infection. The lowest concentration of SARS-CoV-2 viral copies this assay can detect is 250 copies / mL. A negative result does not preclude SARS-CoV-2 infection and should not be used as the sole basis for treatment or other patient management decisions.  A negative result may occur with improper specimen collection / handling, submission of specimen other than nasopharyngeal swab, presence of viral mutation(s) within the areas targeted by this assay, and inadequate number of viral copies (<250 copies / mL). A negative result must be combined with clinical observations, patient history, and epidemiological information. Fact Sheet for Patients:   StrictlyIdeas.no Fact Sheet for  Healthcare Providers: BankingDealers.co.za This test is not yet approved or cleared  by the [place] FDA and has been authorized for detection and/or diagnosis of SARS-CoV-2 by FDA under an Emergency Use Authorization (EUA).  This EUA will remain in effect (meaning this test can be used) for the duration of the COVID-19 declaration under Section 564(b)(1) of the Act, 21 U.S.C. section 360bbb-3(b)(1), unless the authorization is terminated or revoked sooner. Performed at [facility], [address] [place], [place] [address]       Imaging Studies   DG Chest 2 View  Result Date: 08/09/2019 CLINICAL DATA:  Cough.  Chronic shortness of breath. EXAM: CHEST - 2 VIEW COMPARISON:  Chest x-ray 07/31/2015 and chest CT 11/04/2018 FINDINGS: The heart is mildly enlarged but stable. Stable tortuosity and calcification of the thoracic aorta. Stable pacer wires. Stable surgical changes from bypass surgery. Stable emphysematous changes and pulmonary scarring. No definite acute overlying pulmonary process. No pneumothorax. Suspect small effusions. IMPRESSION: Chronic emphysematous changes and pulmonary scarring but no definite acute overlying pulmonary process. Small effusions. Electronically Signed   By: Marijo Sanes M.D.   On: 08/09/2019 16:36   US Venous Img Lower Unilateral Right (DVT)  Result Date: 08/10/2019 CLINICAL DATA:  Right lower extremity pain and swelling increased over the past several weeks EXAM: RIGHT LOWER EXTREMITY VENOUS DOPPLER ULTRASOUND TECHNIQUE: Gray-scale sonography with compression, as well as color and duplex ultrasound, were performed to evaluate the deep venous system(s) from the level of the common femoral vein through the popliteal and proximal calf veins. COMPARISON:  None. FINDINGS: VENOUS Normal compressibility of the common femoral, superficial femoral, and popliteal veins, as well as the visualized calf veins. Visualized portions of  profunda femoral vein and great saphenous vein unremarkable. No filling defects to suggest DVT on grayscale or color Doppler imaging. Doppler waveforms show normal direction of venous flow, normal respiratory plasticity and response to augmentation. Limited views of the contralateral common femoral vein are unremarkable. OTHER None. Limitations: none IMPRESSION: No evidence of deep venous thrombosis in the right leg. Electronically Signed   By: Inez Catalina M.D.   On: 08/10/2019 01:58   ECHOCARDIOGRAM COMPLETE  Result Date: 08/10/2019    ECHOCARDIOGRAM REPORT   Patient Name:   Sheryl Hughes Date  of Exam: 08/10/2019 Medical Rec #:  [id number]     Height:       66.0 in Accession #:    [id number]    Weight:       88.0 lb Date of Birth:  1931-12-02     BSA:          1.411 m Patient Age:    69 years      BP:           94/52 mmHg Patient Gender: F             HR:           77 bpm. Exam Location:  [facility] Procedure: 2D Echo, Color Doppler and Cardiac Doppler Indications:     I50.9 Congestive Heart Failure  History:         Patient has no prior history of Echocardiogram examinations.                  CHF, CAD, CKD; Risk Factors:Hypertension.  Sonographer:     Charmayne Sheer RDCS (AE) Referring Phys:  [id number] Collier Bullock Diagnosing Phys: Bartholome Bill MD  Sonographer Comments: Suboptimal subcostal window. IMPRESSIONS  1. Left ventricular ejection fraction, by estimation, is 25 to 30%. The left ventricle has severely decreased function. The left ventricle demonstrates global hypokinesis. The left ventricular internal cavity size was mildly dilated. Left ventricular diastolic parameters were normal.  2. Right ventricular systolic function is mildly reduced. The right ventricular size is moderately enlarged. There is moderately elevated pulmonary artery systolic pressure.  3. Left atrial size was moderately dilated.  4. The mitral valve is degenerative. Trivial mitral valve regurgitation.  5. Tricuspid valve regurgitation is mild to  moderate.  6. The aortic valve is grossly normal. Aortic valve regurgitation is not visualized. FINDINGS  Left Ventricle: Left ventricular ejection fraction, by estimation, is 25 to 30%. The left ventricle has severely decreased function. The left ventricle demonstrates global hypokinesis. The left ventricular internal cavity size was mildly dilated. There is no left ventricular hypertrophy. Left ventricular diastolic parameters were normal. Right Ventricle: The right ventricular size is moderately enlarged. No increase in right ventricular wall thickness. Right ventricular systolic function is mildly reduced. There is moderately elevated pulmonary artery systolic pressure. The tricuspid regurgitant velocity is 3.49 m/s, and with an assumed right atrial pressure of 10 mmHg, the estimated right ventricular systolic pressure is 15.4 mmHg. Left Atrium: Left atrial size was moderately dilated. Right Atrium: Right atrial size was normal in size. Pericardium: There is no evidence of pericardial effusion. Mitral Valve: The mitral valve is degenerative in appearance. Trivial mitral valve regurgitation. MV peak gradient, 8.9 mmHg. The mean mitral valve gradient is 4.0 mmHg. Tricuspid Valve: The tricuspid valve is grossly normal. Tricuspid valve regurgitation is mild to moderate. Aortic Valve: The aortic valve is grossly normal. Aortic valve regurgitation is not visualized. Aortic valve mean gradient measures 8.0 mmHg. Aortic valve peak gradient measures 16.3 mmHg. Aortic valve area, by VTI measures 0.99 cm. Pulmonic Valve: The pulmonic valve was not well visualized. Pulmonic valve regurgitation is trivial. Aorta: The aortic root is normal in size and structure. IAS/Shunts: The interatrial septum was not assessed. Additional Comments: A pacer wire is visualized.  LEFT VENTRICLE PLAX 2D LVIDd:         5.18 cm  Diastology LVIDs:         4.61 cm  LV e' lateral:   5.33 cm/s LV PW:  0.88 cm  LV E/e' lateral: 23.3 LV IVS:         0.64 cm  LV e' medial:    3.70 cm/s LVOT diam:     2.10 cm  LV E/e' medial:  33.6 LV SV:         36 LV SV Index:   26 LVOT Area:     3.46 cm  RIGHT VENTRICLE RV Basal diam:  3.37 cm LEFT ATRIUM             Index       RIGHT ATRIUM           Index LA diam:        3.90 cm 2.76 cm/m  RA Area:     12.60 cm LA Vol (A2C):   53.8 ml 38.14 ml/m RA Volume:   30.30 ml  21.48 ml/m LA Vol (A4C):   53.0 ml 37.57 ml/m LA Biplane Vol: 54.2 ml 38.43 ml/m  AORTIC VALVE                    PULMONIC VALVE AV Area (Vmax):    1.05 cm     PV Vmax:       1.00 m/s AV Area (Vmean):   1.12 cm     PV Vmean:      66.400 cm/s AV Area (VTI):     0.99 cm     PV VTI:        0.149 m AV Vmax:           202.00 cm/s  PV Peak grad:  4.0 mmHg AV Vmean:          128.333 cm/s PV Mean grad:  2.0 mmHg AV VTI:            0.364 m AV Peak Grad:      16.3 mmHg AV Mean Grad:      8.0 mmHg LVOT Vmax:         61.30 cm/s LVOT Vmean:        41.500 cm/s LVOT VTI:          0.104 m LVOT/AV VTI ratio: 0.29  AORTA Ao Root diam: 3.00 cm MITRAL VALVE                TRICUSPID VALVE MV Area (PHT): 3.03 cm     TR Peak grad:   48.7 mmHg MV Peak grad:  8.9 mmHg     TR Vmax:        349.00 cm/s MV Mean grad:  4.0 mmHg MV Vmax:       1.49 m/s     SHUNTS MV Vmean:      91.1 cm/s    Systemic VTI:  0.10 m MV Decel Time: 251 msec     Systemic Diam: 2.10 cm MV E velocity: 124.25 cm/s MV A velocity: 51.40 cm/s MV E/A ratio:  2.42 Bartholome Bill MD Electronically signed by Bartholome Bill MD Signature Date/Time: 08/10/2019/12:36:32 PM    Final      Medications   Scheduled Meds: . aspirin EC  81 mg Oral Daily  . atorvastatin  20 mg Oral QHS  . cholecalciferol  2,000 Units Oral Daily  . docusate sodium  100 mg Oral BID  . furosemide  40 mg Intravenous BID  . metoprolol succinate  25 mg Oral Daily  . mometasone-formoterol  2 puff Inhalation BID  . nystatin cream   Topical BID  . pantoprazole  40 mg Oral  Daily  . polyethylene glycol  17 g Oral Daily  . potassium  chloride  10 mEq Oral Daily  . sodium chloride flush  3 mL Intravenous Q12H  . vitamin B-12  1,000 mcg Oral Daily  . warfarin  2.5 mg Oral Daily  . Warfarin - Physician Dosing Inpatient   Does not apply q1600   Continuous Infusions: . sodium chloride         LOS: 1 day    Time spent: 40 minutes with > 50% spent in coordination of care and direct patient contact.    Ezekiel Slocumb, DO [facility]  08/10/2019, 4:34 PM    If 7PM-7AM, please contact night-coverage. How to contact the [facility] Attending or Consulting provider [place] or covering provider during after hours [place], for this patient?    1. Check the care team in [facility] and look for a) attending/consulting [facility] provider listed and b) the [facility] team listed 2. Log into [email or web address] and use [place]'s universal password to access. If you do not have the password, please contact the hospital operator. 3. Locate the [facility] provider you are looking for under [facility] and page to a number that you can be directly reached. 4. If you still have difficulty reaching the provider, please page the [facility] (Director on Call) for the Hospitalists listed on amion for assistance.

## 2019-08-10 NOTE — ED Notes (Deleted)
NP Randol Kern to assess patient at bedside at this time.

## 2019-08-10 NOTE — Hospital Course (Addendum)
Sheryl Hughes is a 86 y.o. female with medical history of paroxysmal atrial fibrillation, chronic systolic dysfunction CHF with last known LVEF of 20%, coronary artery disease, stage IV chronic kidney disease, essential hypertension, COPD and pulmonary hypertension who presented to the ED on 08/09/19 for evaluation of shortness of breath and lower extremity swelling.  She reported recent increased diuretic doses in outpatient setting, but has not noted improvement.  She reports recently that she is sleeping a lot and has no energy, wonders if she needs supplemental oxygen.  Chest xray showed chronic emphysematous changes and pulmonary scarring but no definite acute overlying pulmonary process. Small effusions.  EKG showed a ventricular paced rhythm.  Labs reveal mildly elevated troponin level as well as BNP level > 3000.  Admitted to hospitalist service with cardiology consulted, undergoing IV diuresis.    Palliative is consulted, goals of care discussions ongoing.  Initial plan is trial of short term rehab to see if patient will regain some strength vs going home with hospice.

## 2019-08-10 NOTE — Consult Note (Signed)
Cardiology Consultation Note    Patient ID: Sheryl Hughes, MRN: [id number], DOB/AGE: Sep 23, 1931 86 y.o. Admit date: 08/09/2019   Date of Consult: 08/10/2019 Primary Physician: Kirk Ruths, MD Primary Cardiologist: Dr. Saralyn Pilar  Chief Complaint: sob Reason for Consultation: Duanne Limerick Requesting MD: Dr. Arbutus Ped  HPI: Sheryl Hughes is a 86 y.o. female with history of :  1. LIMA to LAD 10/30/2011 2. Mitral valve repair, s/p Maze with dual-chamber ppm 10/30/2011 3. Ischemic cardiomyopathy 4. Chronic systolic congestive heart failure 5. Paroxysmal atrial fibrillation 6. Upgrade to dual-chamber ICD 07/05/2012 7. Essential hypertension 8. Hyperlipidemia 9 . Pulmonary hypertension 10. Bilateral pleural effusions, thoracentesis 10/22/2014, 10/24/2014 11. Cough secondary to lisinopril  Patient has a history of aforementioned problems.  She has had a history of an AICD shock in the past when noncompliant with her metoprolol.  Her most recent echocardiogram was in February 19 of 2021 showing an ejection fraction of 20 to 25% with moderate MR and TR.  Previous echo several years earlier showed similar abnormalities.  She has had her ICD interrogated as recently as March 31 of 2021 showing normal function dual-chamber ICD with underlying rhythm of atrial fibrillation.  She is status post a Maze procedure status post cardioversion and has a chads score of 6.  She is anticoagulated with warfarin with an INR goal between 2 and 3.  She presented to emergency room with complaints of shortness of breath.  He felt like the symptoms did not present for approximately 4 weeks.  She increase her diuretics without any improvement.  Chest x-ray in the emergency room showed emphysema but no pulmonary edema.  She had a paced rhythm on electrocardiogram.  Her BNP was 3071 up from 797 1 year ago.  She had a trivial troponin elevation likely secondary to demand with a peak thus far at 46 up from 39 on presentation.  She  has chronic renal insufficiency.  She appears to be at her baseline.  She reports compliance with her medicines.  Past Medical History:  Diagnosis Date  . Atrial fibrillation ([place])   . CAD (coronary artery disease)   . CHF (congestive heart failure) ([place])   . CKD (chronic kidney disease)   . GERD (gastroesophageal reflux disease)   . Hypertension   . Pulmonary hypertension ([place])   . Shingles       Surgical History:  Past Surgical History:  Procedure Laterality Date  . BACK SURGERY    . CARDIAC CATHETERIZATION    . COLONOSCOPY WITH PROPOFOL N/A 12/08/2014   Procedure: COLONOSCOPY WITH PROPOFOL;  Surgeon: Lucilla Lame, MD;  Location: [facility] ENDOSCOPY;  Service: Endoscopy;  Laterality: N/A;  look into colon with scope  . CORONARY ARTERY BYPASS GRAFT    . ESOPHAGOGASTRODUODENOSCOPY (EGD) WITH PROPOFOL N/A 12/08/2014   Procedure: ESOPHAGOGASTRODUODENOSCOPY (EGD) WITH PROPOFOL;  Surgeon: Lucilla Lame, MD;  Location: [facility] ENDOSCOPY;  Service: Endoscopy;  Laterality: N/A;  Look into stomach with scope.  Marland Kitchen EYE SURGERY    . MASTECTOMY    . MITRAL VALVE REPAIR    . PACEMAKER PLACEMENT     defibrillator  . THORACENTESIS Bilateral      Home Meds: Prior to Admission medications   Medication Sig Start Date End Date Taking? Authorizing Provider  aspirin EC 81 MG tablet Take 81 mg by mouth daily.   Yes [provider]  atorvastatin (LIPITOR) 20 MG tablet Take 20 mg by mouth at bedtime.    Yes [provider]  Calcium  Carbonate-Vitamin D 600-400 MG-UNIT tablet Take by mouth.   Yes [provider]  Cholecalciferol (VITAMIN D-1000 MAX ST) 25 MCG (1000 UT) tablet Take by mouth.   Yes [provider]  docusate sodium (COLACE) 100 MG capsule Take 100 mg by mouth 2 (two) times daily.    Yes [provider]  furosemide (LASIX) 40 MG tablet Take 40-80 mg by mouth daily as needed for fluid.    Yes [provider]  hydrOXYzine (ATARAX/VISTARIL) 10 MG  tablet Take by mouth. 09/22/17  Yes [provider]  losartan (COZAAR) 25 MG tablet Take 25 mg by mouth daily. 07/03/19  Yes [provider]  metoprolol tartrate (LOPRESSOR) 100 MG tablet Take 100 mg by mouth 2 (two) times daily. 06/12/19  Yes [provider]  pantoprazole (PROTONIX) 40 MG tablet TAKE 1 TABLET BY MOUTH TWICE DAILY 08/22/18  Yes [provider]  polyethylene glycol (MIRALAX / GLYCOLAX) packet Take 17 g by mouth daily. 08/05/15  Yes Theodoro Grist, MD  potassium chloride (KLOR-CON) 10 MEQ tablet Take 10 mEq by mouth daily.   Yes [provider]  vitamin B-12 (CYANOCOBALAMIN) 1000 MCG tablet Take 1,000 mcg by mouth daily.   Yes [provider]  warfarin (COUMADIN) 5 MG tablet Take 2.5 mg by mouth. (1/2 tablet) 03/28/18  Yes [provider]    Inpatient Medications:  . aspirin EC  81 mg Oral Daily  . atorvastatin  20 mg Oral QHS  . cholecalciferol  2,000 Units Oral Daily  . docusate sodium  100 mg Oral BID  . furosemide  40 mg Intravenous BID  . metoprolol succinate  25 mg Oral Daily  . mometasone-formoterol  2 puff Inhalation BID  . nystatin cream   Topical BID  . pantoprazole  40 mg Oral Daily  . polyethylene glycol  17 g Oral Daily  . potassium chloride  10 mEq Oral Daily  . sodium chloride flush  3 mL Intravenous Q12H  . vitamin B-12  1,000 mcg Oral Daily  . warfarin  2.5 mg Oral Daily  . Warfarin - Physician Dosing Inpatient   Does not apply q1600   . sodium chloride      Allergies:  Allergies  Allergen Reactions  . Biaxin [Clarithromycin] Other (See Comments)    Reaction:  Numbness of lips   . Flagyl [Metronidazole] Other (See Comments)    Reaction:  Numbness of lips   . Penicillins Rash and Other (See Comments)    Pt is unable to answer additional questions about this medication because it happened so long ago.  . Tetracyclines & Related Rash    Social History   Socioeconomic History  . Marital  status: Married    Spouse name: Not on file  . Number of children: Not on file  . Years of education: Not on file  . Highest education level: Not on file  Occupational History  . Not on file  Tobacco Use  . Smoking status: Never Smoker  . Smokeless tobacco: Never Used  Substance and Sexual Activity  . Alcohol use: No    Alcohol/week: 0.0 standard drinks  . Drug use: No  . Sexual activity: Not on file  Other Topics Concern  . Not on file  Social History Narrative  . Not on file   Social Determinants of Health   Financial Resource Strain:   . Difficulty of Paying Living Expenses:   Food Insecurity:   . Worried About Charity fundraiser in  the Last Year:   . [place] in the Last Year:   Transportation Needs:   . Film/video editor (Medical):   Marland Kitchen Lack of Transportation (Non-Medical):   Physical Activity:   . Days of Exercise per Week:   . Minutes of Exercise per Session:   Stress:   . Feeling of Stress :   Social Connections:   . Frequency of Communication with Friends and Family:   . Frequency of Social Gatherings with Friends and Family:   . Attends Religious Services:   . Active Member of Clubs or Organizations:   . Attends Archivist Meetings:   Marland Kitchen Marital Status:   Intimate Partner Violence:   . Fear of Current or Ex-Partner:   . Emotionally Abused:   Marland Kitchen Physically Abused:   . Sexually Abused:      Family History  Problem Relation Age of Onset  . Heart attack Mother   . Heart attack Father   . COPD Father   . Ulcers Father   . Atrial fibrillation Sister   . Lung cancer Sister   . Heart attack Brother   . Aortic stenosis Brother   . CAD Brother      Review of Systems: A 12-system review of systems was performed and is negative except as noted in the HPI.  Labs: No results for input(s): CKTOTAL, CKMB, TROPONINI in the last 72 hours. Lab Results  Component Value Date   WBC 6.1 08/09/2019   HGB 11.8 (L) 08/09/2019   HCT 36.8  08/09/2019   MCV 91.5 08/09/2019   PLT 185 08/09/2019    Recent Labs  Lab 08/09/19 1548 08/09/19 1548 08/10/19 0334  NA 135   < > 139  K 4.8   < > 4.7  CL 90*   < > 95*  CO2 37*   < > 36*  BUN 36*   < > 32*  CREATININE 1.30*   < > 1.29*  CALCIUM 9.0   < > 8.6*  PROT 7.0  --   --   BILITOT 0.7  --   --   ALKPHOS 90  --   --   ALT 16  --   --   AST 34  --   --   GLUCOSE 100*   < > 97   < > = values in this interval not displayed.   Lab Results  Component Value Date   CHOL 90 10/22/2014   TRIG 78 10/22/2014   No results found for: DDIMER  Radiology/Studies:  DG Chest 2 View  Result Date: 08/09/2019 CLINICAL DATA:  Cough.  Chronic shortness of breath. EXAM: CHEST - 2 VIEW COMPARISON:  Chest x-ray 07/31/2015 and chest CT 11/04/2018 FINDINGS: The heart is mildly enlarged but stable. Stable tortuosity and calcification of the thoracic aorta. Stable pacer wires. Stable surgical changes from bypass surgery. Stable emphysematous changes and pulmonary scarring. No definite acute overlying pulmonary process. No pneumothorax. Suspect small effusions. IMPRESSION: Chronic emphysematous changes and pulmonary scarring but no definite acute overlying pulmonary process. Small effusions. Electronically Signed   By: Marijo Sanes M.D.   On: 08/09/2019 16:36   US Venous Img Lower Unilateral Right (DVT)  Result Date: 08/10/2019 CLINICAL DATA:  Right lower extremity pain and swelling increased over the past several weeks EXAM: RIGHT LOWER EXTREMITY VENOUS DOPPLER ULTRASOUND TECHNIQUE: Gray-scale sonography with compression, as well as color and duplex ultrasound, were performed to evaluate the deep venous system(s) from the level of  the common femoral vein through the popliteal and proximal calf veins. COMPARISON:  None. FINDINGS: VENOUS Normal compressibility of the common femoral, superficial femoral, and popliteal veins, as well as the visualized calf veins. Visualized portions of profunda femoral  vein and great saphenous vein unremarkable. No filling defects to suggest DVT on grayscale or color Doppler imaging. Doppler waveforms show normal direction of venous flow, normal respiratory plasticity and response to augmentation. Limited views of the contralateral common femoral vein are unremarkable. OTHER None. Limitations: none IMPRESSION: No evidence of deep venous thrombosis in the right leg. Electronically Signed   By: Inez Catalina M.D.   On: 08/10/2019 01:58    Wt Readings from Last 3 Encounters:  08/10/19 39.9 kg  10/25/18 42.8 kg  12/19/15 40.8 kg    EKG: Atrial fib with paced rhythm  Physical Exam:  Blood pressure (!) 101/55, pulse 88, temperature 98 F (36.7 C), temperature source Oral, resp. rate 20, height 5\' 6"  (1.676 m), weight 39.9 kg, SpO2 100 %. Body mass index is 14.2 kg/m. General: Well developed, well nourished, in no acute distress. Head: Normocephalic, atraumatic, sclera non-icteric, no xanthomas, nares are without discharge.  Neck: Negative for carotid bruits. JVD not elevated. Lungs: Clear bilaterally to auscultation without wheezes, rales, or rhonchi. Breathing is unlabored. Heart: Irregular regular rhythm Abdomen: Soft, non-tender, non-distended with normoactive bowel sounds. No hepatomegaly. No rebound/guarding. No obvious abdominal masses. Msk:  Strength and tone appear normal for age. Extremities: No clubbing or cyanosis. No edema.  Distal pedal pulses are 2+ and equal bilaterally. Neuro: Alert and oriented X 3. No facial asymmetry. No focal deficit. Moves all extremities spontaneously. Psych:  Responds to questions appropriately with a normal affect.  Appears somewhat anxious     Assessment and Plan   1. LIMA to LAD 10/30/2011 2. Mitral valve repair, s/p Maze with dual-chamber ppm 10/30/2011 3. Ischemic cardiomyopathy 4. Chronic systolic congestive heart failure 5. Paroxysmal atrial fibrillation 6. Upgrade to dual-chamber ICD 07/05/2012 7.  Essential hypertension 8. Hyperlipidemia 9 . Pulmonary hypertension 10. Bilateral pleural effusions, thoracentesis 10/22/2014, 10/24/2014 11. Cough secondary to lisinopril  1.  Coronary artery disease-appears to have ruled out for myocardial infarction.  Mildly elevated troponin appears to be secondary to demand as well as congestive heart failure.  Continue with medical management to include aspirin 81 mg daily, mild metoprolol tartrate 100 mg twice daily and continue with antihypertensives including losartan.  Careful diuresis following renal function with IV furosemide.  2.  Cardiomyopathy-AICD in place.  Device was recently interrogated and is functioning normally.  Has been on metoprolol tartrate as an outpatient we will continue with this with consideration for alternative therapies such as carvedilol.  Continue with afterload reduction with losartan as she has an intolerance and a cough to an ACE inhibitor.  Not a candidate for Entresto secondary to this.  Low-sodium diet.  Follow BNP.  Ejection fraction recently evaluated to be at 20 to 25%.  This is been pretty stable for the last 3 or 4 years.  3.  Atrial fibrillation-status post Maze procedure but has recurrent A. fib.  Anticoagulated with warfarin.  We will continue with this with an INR goal between 2 and 3.  Rate control with metoprolol.  4.  Renal insufficiency-appears to be at her baseline.  Will need to carefully follow electrolytes and renal function with diuresis.  5.  Hyperlipidemia-continue with atorvastatin 20 mg daily.  Low-fat diet.   Signed, Teodoro Spray MD 08/10/2019, 10:07 AM Pager: [phone]  

## 2019-08-10 NOTE — ED Notes (Signed)
Dr Arbutus Ped made aware pt is having multiple PVC's and has had a 3 beat run of VT.

## 2019-08-10 NOTE — ED Notes (Signed)
NP Randol Kern to assess patient at 91. New orders placed.   08/10/19 0008  Assess: MEWS Score  Temp 98 F (36.7 C)  BP (!) 75/48  Pulse Rate 88  ECG Heart Rate 82  Resp 19  Level of Consciousness Alert  SpO2 92 %  O2 Device Room Air  Assess: MEWS Score  MEWS Temp 0  MEWS Systolic 2  MEWS Pulse 0  MEWS RR 0  MEWS LOC 0  MEWS Score 2  MEWS Score Color Yellow  Notify: Provider  Provider Name/Title NP Randol Kern  Date Provider Notified 08/10/19  Time Provider Notified 0010  Notification Type Page  Notification Reason Other (Comment)  Response Other (Comment) (NP to bedside)  Date of Provider Response 08/10/19  Time of Provider Response [phone]

## 2019-08-11 DIAGNOSIS — E43 Unspecified severe protein-calorie malnutrition: Secondary | ICD-10-CM

## 2019-08-11 DIAGNOSIS — Z7189 Other specified counseling: Secondary | ICD-10-CM

## 2019-08-11 DIAGNOSIS — Z515 Encounter for palliative care: Secondary | ICD-10-CM

## 2019-08-11 LAB — PROTIME-INR
INR: 2.2 — ABNORMAL HIGH (ref 0.8–1.2)
Prothrombin Time: 23.3 seconds — ABNORMAL HIGH (ref 11.4–15.2)

## 2019-08-11 LAB — BASIC METABOLIC PANEL
Anion gap: 9 (ref 5–15)
BUN: 29 mg/dL — ABNORMAL HIGH (ref 8–23)
CO2: 37 mmol/L — ABNORMAL HIGH (ref 22–32)
Calcium: 8.6 mg/dL — ABNORMAL LOW (ref 8.9–10.3)
Chloride: 93 mmol/L — ABNORMAL LOW (ref 98–111)
Creatinine, Ser: 1.22 mg/dL — ABNORMAL HIGH (ref 0.44–1.00)
GFR calc Af Amer: 47 mL/min — ABNORMAL LOW (ref >60)
GFR calc non Af Amer: 40 mL/min — ABNORMAL LOW (ref >60)
Glucose, Bld: 86 mg/dL (ref 70–99)
Potassium: 4.5 mmol/L (ref 3.5–5.1)
Sodium: 139 mmol/L (ref 135–145)

## 2019-08-11 LAB — CBC
HCT: 32.4 % — ABNORMAL LOW (ref 36.0–46.0)
Hemoglobin: 10.4 g/dL — ABNORMAL LOW (ref 12.0–15.0)
MCH: 29.2 pg (ref 26.0–34.0)
MCHC: 32.1 g/dL (ref 30.0–36.0)
MCV: 91 fL (ref 80.0–100.0)
Platelets: 135 10*3/uL — ABNORMAL LOW (ref 150–400)
RBC: 3.56 MIL/uL — ABNORMAL LOW (ref 3.87–5.11)
RDW: 16.5 % — ABNORMAL HIGH (ref 11.5–15.5)
WBC: 5.9 10*3/uL (ref 4.0–10.5)
nRBC: 0 % (ref 0.0–0.2)

## 2019-08-11 LAB — MAGNESIUM: Magnesium: 2.1 mg/dL (ref 1.7–2.4)

## 2019-08-11 MED ORDER — SENNA 8.6 MG PO TABS: 1.0000 | ORAL_TABLET | Freq: Every day | ORAL | Status: DC | PRN

## 2019-08-11 MED ORDER — ENSURE ENLIVE PO LIQD: 237.0000 mL | ORAL | Status: DC

## 2019-08-11 MED ORDER — ADULT MULTIVITAMIN W/MINERALS CH
1.0000 | ORAL_TABLET | Freq: Every day | ORAL | Status: DC
  Administered 2019-08-12 – 2019-08-13 (×2): 1 via ORAL
  Filled 2019-08-11 (×3): qty 1

## 2019-08-11 MED ORDER — METOPROLOL TARTRATE 5 MG/5ML IV SOLN
5.0000 mg | Freq: Once | INTRAVENOUS | Status: AC
  Administered 2019-08-11: 5 mg via INTRAVENOUS
  Filled 2019-08-11: qty 5

## 2019-08-11 MED ORDER — METOPROLOL SUCCINATE ER 25 MG PO TB24
37.5000 mg | ORAL_TABLET | Freq: Every day | ORAL | Status: DC
  Administered 2019-08-12 – 2019-08-14 (×3): 37.5 mg via ORAL
  Filled 2019-08-11 (×3): qty 2

## 2019-08-11 MED ORDER — POLYETHYLENE GLYCOL 3350 17 G PO PACK: 17.0000 g | PACK | Freq: Every day | ORAL | Status: DC | PRN

## 2019-08-11 NOTE — Progress Notes (Signed)
PROGRESS NOTE    Sheryl Hughes   VVZ:[id number]  DOB: [birth year]  PCP: Kirk Ruths, MD    DOA: 08/09/2019 LOS: 2   Brief Narrative   Sheryl Hughes is a 86 y.o. female with medical history of paroxysmal atrial fibrillation, chronic systolic dysfunction CHF with last known LVEF of 20%, coronary artery disease, stage IV chronic kidney disease, essential hypertension, COPD and pulmonary hypertension who presented to the ED on 08/09/19 for evaluation of shortness of breath and lower extremity swelling.  She reported recent increased diuretic doses in outpatient setting, but has not noted improvement.  She reports recently that she is sleeping a lot and has no energy, wonders if she needs supplemental oxygen.  Chest xray showed chronic emphysematous changes and pulmonary scarring but no definite acute overlying pulmonary process. Small effusions.  EKG showed a ventricular paced rhythm.  Labs reveal mildly elevated troponin level as well as BNP level > 3000.  Admitted to hospitalist service with cardiology consulted, undergoing IV diuresis.     Assessment & Plan   Principal Problem:   Acute on chronic systolic CHF (congestive heart failure) (HCC) Active Problems:   Atrial fibrillation (HCC)   Chronic kidney disease (CKD), stage IV (severe) (HCC)   Pulmonary hypertension (HCC)   COPD (chronic obstructive pulmonary disease) (HCC)   CAD (coronary artery disease)   Acute on chronic systolic CHF - POA with SOB/DOE and edema. Echo showed EF of 20-25% with global hypokinesis.  Net fluid balance -1430 to date this admission. --Cardiology following --Continue IV diuresis --follow up echo --strict I/O's and daily weights --monitor renal function & electrolytes closely --Palliative care consult for goals of care discussion   Chronic Atrial fibrillation - rate controlled.  Continue warfarin for anticoagulation, pharmacy consulted for dosing.  Daily INR's.  Continue Toprol XL, increased  slightly for better HR control.  Monitor BP and HR.  CKD stage IV (severe) - no uremic symptoms.  Monitor renal function closely with diuresis, consider nephrology consult if Cr rising.  Avoid nephrotoxin and hypotension as much as possible.    Pulmonary hypertension - contributes to primary problem above.  Monitor.  Supplemental O2 as needed to keep O2 sat > 90%.  COPD - not acutely exacerbated, has no wheezing on exam, but does contribute to her symptoms.  Appears not to be on bronchodilators outpatient.  Dulera BID.  Duonebs PRN.    Coronary Artery Disease - stable, no active chest pain.  Continue ASA and statin.  GERD - continue PPI  Foot cramping - electrolytes are okay, unclear cause.  Onset this afternoon.  Improved with trial of Robaxin, ordered PRN.  DVT prophylaxis: on coumadin  Diet:  Diet Orders (From admission, onward)    Start     Ordered   08/09/19 1839  Diet 2 gram sodium Room service appropriate? Yes; Fluid consistency: Thin  Diet effective now    Question Answer Comment  Room service appropriate? Yes   Fluid consistency: Thin      08/09/19 1840            Code Status: DNR    Subjective 08/11/19    Patient seen at bedside this AM.  She reported feeling unwell, stating she was worn out after taking morning medications.  States she didn't sleep well, Tylenol keeps her awake.  Denies SOB at rest, CP or palpitations.      Spoke to daughter later in the AM in hallway.  She reported patient had mentioned "  just want to be comfortable".  She was very agreeable to palliative care consult, and stated patient actually used the word hospice as well.     Disposition Plan & Communication   Status is: Inpatient  Remains inpatient appropriate because:IV treatments appropriate due to intensity of illness or inability to take PO   Dispo: The patient is from: Home              Anticipated d/c is to: Home              Anticipated d/c date is: 2 days              Patient  currently is not medically stable to d/c.   Family Communication: none at bedside, will attempt to call    Consults, Procedures, Significant Events   Consultants:   Cardiology  Procedures:   2D Echo 08/09/19   Objective   Vitals:   08/10/19 1258 08/10/19 1546 08/10/19 1959 08/11/19 0748  BP: (!) 102/53 103/60 (!) 98/53 91/66  Pulse: 93 71 89 100  Resp: 17 16 20 16   Temp: 97.6 F (36.4 C) 98.4 F (36.9 C) 98.2 F (36.8 C) 98.6 F (37 C)  TempSrc: Oral Oral Oral Oral  SpO2: 100% 100% 100% 100%  Weight: 38.1 kg     Height: 5\' 4"  (1.626 m)       Intake/Output Summary (Last 24 hours) at 08/11/2019 0843 Last data filed at 08/11/2019 7408 Gross per 24 hour  Intake 120 ml  Output 900 ml  Net -780 ml   Filed Weights   08/09/19 1534 08/10/19 0800 08/10/19 1258  Weight: 3.901 kg 39.9 kg 38.1 kg    Physical Exam:  General exam: awake, alert, no acute distress, frail, cachectic Respiratory system: decreased breath sounds, shallow inspirations, normal respiratory effort. Cardiovascular system: normal S1/S2, irregular rhythm, regular rate, no pedal edema.   Central nervous system: A&O x3. no gross focal neurologic deficits, normal speech Extremities: moves all, no edema, normal tone, right foot erythema due to fungus, distal left lower extremity ecchymosis and erythema (chronic per pt) Skin: dry, intact, normal temperature Psychiatry: normal mood, congruent affect, judgement and insight appear normal  Labs   Data Reviewed: I have personally reviewed following labs and imaging studies  CBC: Recent Labs  Lab 08/09/19 1548 08/11/19 0558  WBC 6.1 5.9  NEUTROABS 4.5  --   HGB 11.8* 10.4*  HCT 36.8 32.4*  MCV 91.5 91.0  PLT 185 144*   Basic Metabolic Panel: Recent Labs  Lab 08/09/19 1548 08/10/19 0334 08/11/19 0558  NA 135 139 139  K 4.8 4.7 4.5  CL 90* 95* 93*  CO2 37* 36* 37*  GLUCOSE 100* 97 86  BUN 36* 32* 29*  CREATININE 1.30* 1.29* 1.22*  CALCIUM 9.0  8.6* 8.6*  MG  --  2.2 2.1   GFR: Estimated Creatinine Clearance: 20.3 mL/min (A) (by C-G formula based on SCr of 1.22 mg/dL (H)). Liver Function Tests: Recent Labs  Lab 08/09/19 1548  AST 34  ALT 16  ALKPHOS 90  BILITOT 0.7  PROT 7.0  ALBUMIN 3.7   No results for input(s): LIPASE, AMYLASE in the last 168 hours. No results for input(s): AMMONIA in the last 168 hours. Coagulation Profile: Recent Labs  Lab 08/09/19 2010 08/10/19 0334 08/11/19 0558  INR 2.3* 2.3* 2.2*   Cardiac Enzymes: No results for input(s): CKTOTAL, CKMB, CKMBINDEX, TROPONINI in the last 168 hours. BNP (last 3 results) No results for input(s):  PROBNP in the last 8760 hours. HbA1C: No results for input(s): HGBA1C in the last 72 hours. CBG: No results for input(s): GLUCAP in the last 168 hours. Lipid Profile: No results for input(s): CHOL, HDL, LDLCALC, TRIG, CHOLHDL, LDLDIRECT in the last 72 hours. Thyroid Function Tests: No results for input(s): TSH, T4TOTAL, FREET4, T3FREE, THYROIDAB in the last 72 hours. Anemia Panel: No results for input(s): VITAMINB12, FOLATE, FERRITIN, TIBC, IRON, RETICCTPCT in the last 72 hours. Sepsis Labs: No results for input(s): PROCALCITON, LATICACIDVEN in the last 168 hours.  Recent Results (from the past 240 hour(s))  SARS Coronavirus 2 by RT PCR (hospital order, performed in [facility] hospital lab) Nasopharyngeal Nasopharyngeal Swab     Status: None   Collection Time: 08/09/19  8:10 PM   Specimen: Nasopharyngeal Swab  Result Value Ref Range Status   SARS Coronavirus 2 NEGATIVE NEGATIVE Final    Comment: (NOTE) SARS-CoV-2 target nucleic acids are NOT DETECTED. The SARS-CoV-2 RNA is generally detectable in upper and lower respiratory specimens during the acute phase of infection. The lowest concentration of SARS-CoV-2 viral copies this assay can detect is 250 copies / mL. A negative result does not preclude SARS-CoV-2 infection and should not be used as the sole  basis for treatment or other patient management decisions.  A negative result may occur with improper specimen collection / handling, submission of specimen other than nasopharyngeal swab, presence of viral mutation(s) within the areas targeted by this assay, and inadequate number of viral copies (<250 copies / mL). A negative result must be combined with clinical observations, patient history, and epidemiological information. Fact Sheet for Patients:   StrictlyIdeas.no Fact Sheet for Healthcare Providers: BankingDealers.co.za This test is not yet approved or cleared  by the [place] FDA and has been authorized for detection and/or diagnosis of SARS-CoV-2 by FDA under an Emergency Use Authorization (EUA).  This EUA will remain in effect (meaning this test can be used) for the duration of the COVID-19 declaration under Section 564(b)(1) of the Act, 21 U.S.C. section 360bbb-3(b)(1), unless the authorization is terminated or revoked sooner. Performed at [facility], [address], [place], [place] [address]       Imaging Studies   DG Chest 2 View  Result Date: 08/09/2019 CLINICAL DATA:  Cough.  Chronic shortness of breath. EXAM: CHEST - 2 VIEW COMPARISON:  Chest x-ray 07/31/2015 and chest CT 11/04/2018 FINDINGS: The heart is mildly enlarged but stable. Stable tortuosity and calcification of the thoracic aorta. Stable pacer wires. Stable surgical changes from bypass surgery. Stable emphysematous changes and pulmonary scarring. No definite acute overlying pulmonary process. No pneumothorax. Suspect small effusions. IMPRESSION: Chronic emphysematous changes and pulmonary scarring but no definite acute overlying pulmonary process. Small effusions. Electronically Signed   By: Marijo Sanes M.D.   On: 08/09/2019 16:36   US Venous Img Lower Unilateral Right (DVT)  Result Date: 08/10/2019 CLINICAL DATA:  Right lower extremity pain and  swelling increased over the past several weeks EXAM: RIGHT LOWER EXTREMITY VENOUS DOPPLER ULTRASOUND TECHNIQUE: Gray-scale sonography with compression, as well as color and duplex ultrasound, were performed to evaluate the deep venous system(s) from the level of the common femoral vein through the popliteal and proximal calf veins. COMPARISON:  None. FINDINGS: VENOUS Normal compressibility of the common femoral, superficial femoral, and popliteal veins, as well as the visualized calf veins. Visualized portions of profunda femoral vein and great saphenous vein unremarkable. No filling defects to suggest DVT on grayscale or color Doppler  imaging. Doppler waveforms show normal direction of venous flow, normal respiratory plasticity and response to augmentation. Limited views of the contralateral common femoral vein are unremarkable. OTHER None. Limitations: none IMPRESSION: No evidence of deep venous thrombosis in the right leg. Electronically Signed   By: Inez Catalina M.D.   On: 08/10/2019 01:58   ECHOCARDIOGRAM COMPLETE  Result Date: 08/10/2019    ECHOCARDIOGRAM REPORT   Patient Name:   Sheryl Hughes Date of Exam: 08/10/2019 Medical Rec #:  [id number]     Height:       66.0 in Accession #:    [id number]    Weight:       88.0 lb Date of Birth:  1931-05-07     BSA:          1.411 m Patient Age:    73 years      BP:           94/52 mmHg Patient Gender: F             HR:           77 bpm. Exam Location:  [facility] Procedure: 2D Echo, Color Doppler and Cardiac Doppler Indications:     I50.9 Congestive Heart Failure  History:         Patient has no prior history of Echocardiogram examinations.                  CHF, CAD, CKD; Risk Factors:Hypertension.  Sonographer:     Charmayne Sheer RDCS (AE) Referring Phys:  [id number] Collier Bullock Diagnosing Phys: Bartholome Bill MD  Sonographer Comments: Suboptimal subcostal window. IMPRESSIONS  1. Left ventricular ejection fraction, by estimation, is 25 to 30%. The left ventricle has severely  decreased function. The left ventricle demonstrates global hypokinesis. The left ventricular internal cavity size was mildly dilated. Left ventricular diastolic parameters were normal.  2. Right ventricular systolic function is mildly reduced. The right ventricular size is moderately enlarged. There is moderately elevated pulmonary artery systolic pressure.  3. Left atrial size was moderately dilated.  4. The mitral valve is degenerative. Trivial mitral valve regurgitation.  5. Tricuspid valve regurgitation is mild to moderate.  6. The aortic valve is grossly normal. Aortic valve regurgitation is not visualized. FINDINGS  Left Ventricle: Left ventricular ejection fraction, by estimation, is 25 to 30%. The left ventricle has severely decreased function. The left ventricle demonstrates global hypokinesis. The left ventricular internal cavity size was mildly dilated. There is no left ventricular hypertrophy. Left ventricular diastolic parameters were normal. Right Ventricle: The right ventricular size is moderately enlarged. No increase in right ventricular wall thickness. Right ventricular systolic function is mildly reduced. There is moderately elevated pulmonary artery systolic pressure. The tricuspid regurgitant velocity is 3.49 m/s, and with an assumed right atrial pressure of 10 mmHg, the estimated right ventricular systolic pressure is 70.3 mmHg. Left Atrium: Left atrial size was moderately dilated. Right Atrium: Right atrial size was normal in size. Pericardium: There is no evidence of pericardial effusion. Mitral Valve: The mitral valve is degenerative in appearance. Trivial mitral valve regurgitation. MV peak gradient, 8.9 mmHg. The mean mitral valve gradient is 4.0 mmHg. Tricuspid Valve: The tricuspid valve is grossly normal. Tricuspid valve regurgitation is mild to moderate. Aortic Valve: The aortic valve is grossly normal. Aortic valve regurgitation is not visualized. Aortic valve mean gradient measures  8.0 mmHg. Aortic valve peak gradient measures 16.3 mmHg. Aortic valve area, by VTI measures 0.99 cm. Pulmonic Valve: The pulmonic valve  was not well visualized. Pulmonic valve regurgitation is trivial. Aorta: The aortic root is normal in size and structure. IAS/Shunts: The interatrial septum was not assessed. Additional Comments: A pacer wire is visualized.  LEFT VENTRICLE PLAX 2D LVIDd:         5.18 cm  Diastology LVIDs:         4.61 cm  LV e' lateral:   5.33 cm/s LV PW:         0.88 cm  LV E/e' lateral: 23.3 LV IVS:        0.64 cm  LV e' medial:    3.70 cm/s LVOT diam:     2.10 cm  LV E/e' medial:  33.6 LV SV:         36 LV SV Index:   26 LVOT Area:     3.46 cm  RIGHT VENTRICLE RV Basal diam:  3.37 cm LEFT ATRIUM             Index       RIGHT ATRIUM           Index LA diam:        3.90 cm 2.76 cm/m  RA Area:     12.60 cm LA Vol (A2C):   53.8 ml 38.14 ml/m RA Volume:   30.30 ml  21.48 ml/m LA Vol (A4C):   53.0 ml 37.57 ml/m LA Biplane Vol: 54.2 ml 38.43 ml/m  AORTIC VALVE                    PULMONIC VALVE AV Area (Vmax):    1.05 cm     PV Vmax:       1.00 m/s AV Area (Vmean):   1.12 cm     PV Vmean:      66.400 cm/s AV Area (VTI):     0.99 cm     PV VTI:        0.149 m AV Vmax:           202.00 cm/s  PV Peak grad:  4.0 mmHg AV Vmean:          128.333 cm/s PV Mean grad:  2.0 mmHg AV VTI:            0.364 m AV Peak Grad:      16.3 mmHg AV Mean Grad:      8.0 mmHg LVOT Vmax:         61.30 cm/s LVOT Vmean:        41.500 cm/s LVOT VTI:          0.104 m LVOT/AV VTI ratio: 0.29  AORTA Ao Root diam: 3.00 cm MITRAL VALVE                TRICUSPID VALVE MV Area (PHT): 3.03 cm     TR Peak grad:   48.7 mmHg MV Peak grad:  8.9 mmHg     TR Vmax:        349.00 cm/s MV Mean grad:  4.0 mmHg MV Vmax:       1.49 m/s     SHUNTS MV Vmean:      91.1 cm/s    Systemic VTI:  0.10 m MV Decel Time: 251 msec     Systemic Diam: 2.10 cm MV E velocity: 124.25 cm/s MV A velocity: 51.40 cm/s MV E/A ratio:  2.42 Bartholome Bill MD  Electronically signed by Bartholome Bill MD Signature Date/Time: 08/10/2019/12:36:32 PM    Final  Medications   Scheduled Meds: . aspirin EC  81 mg Oral Daily  . atorvastatin  20 mg Oral QHS  . cholecalciferol  2,000 Units Oral Daily  . docusate sodium  100 mg Oral BID  . furosemide  40 mg Intravenous BID  . metoprolol succinate  25 mg Oral Daily  . mometasone-formoterol  2 puff Inhalation BID  . nystatin cream   Topical BID  . pantoprazole  40 mg Oral Daily  . polyethylene glycol  17 g Oral Daily  . potassium chloride  10 mEq Oral Daily  . sodium chloride flush  3 mL Intravenous Q12H  . vitamin B-12  1,000 mcg Oral Daily  . warfarin  2.5 mg Oral Daily  . Warfarin - Physician Dosing Inpatient   Does not apply q1600   Continuous Infusions: . sodium chloride         LOS: 2 days    Time spent: 30 minutes    Ezekiel Slocumb, DO [facility]  08/11/2019, 8:43 AM    If 7PM-7AM, please contact night-coverage. How to contact the [facility] Attending or Consulting provider [place] or covering provider during after hours [place], for this patient?    1. Check the care team in [facility] and look for a) attending/consulting [facility] provider listed and b) the [facility] team listed 2. Log into [email or web address] and use [place] universal password to access. If you do not have the password, please contact the hospital operator. 3. Locate the [facility] provider you are looking for under [facility] and page to a number that you can be directly reached. 4. If you still have difficulty reaching the provider, please page the [facility] (Director on Call) for the Hospitalists listed on amion for assistance.

## 2019-08-11 NOTE — Progress Notes (Addendum)
Patient Name: Sheryl Hughes Date of Encounter: 08/11/2019  Hospital Problem List     Principal Problem:   Acute on chronic systolic CHF (congestive heart failure) ([place]) Active Problems:   CAD (coronary artery disease)   Chronic kidney disease (CKD), stage IV (severe) (HCC)   Atrial fibrillation (HCC)   Pulmonary hypertension (HCC)   COPD (chronic obstructive pulmonary disease) ([place])   Protein-calorie malnutrition, severe    Patient Profile     86 year old female with history dilated cardiomyopathy with a known ejection fraction of 20 to 25% confirmed by echo during this admission, history of AICD, history of atrial fibrillation treated with rate control and anticoagulation admitted with increasing shortness of breath weakness and failure to thrive.  Had mild elevated troponin however this appears to be secondary to demand and not an acute coronary event.  Subjective   Still a little weak and anorexic.  Mildly tachycardic with rates in the low 100s.  Spoke with the patient and her daughter.  Inpatient Medications    . aspirin EC  81 mg Oral Daily  . atorvastatin  20 mg Oral QHS  . cholecalciferol  2,000 Units Oral Daily  . docusate sodium  100 mg Oral BID  . feeding supplement (ENSURE ENLIVE)  237 mL Oral Q24H  . furosemide  40 mg Intravenous BID  . [START ON 08/12/2019] metoprolol succinate  37.5 mg Oral Daily  . mometasone-formoterol  2 puff Inhalation BID  . [START ON 08/12/2019] multivitamin with minerals  1 tablet Oral Daily  . nystatin cream   Topical BID  . pantoprazole  40 mg Oral Daily  . polyethylene glycol  17 g Oral Daily  . potassium chloride  10 mEq Oral Daily  . sodium chloride flush  3 mL Intravenous Q12H  . vitamin B-12  1,000 mcg Oral Daily  . warfarin  2.5 mg Oral Daily  . Warfarin - Physician Dosing Inpatient   Does not apply q1600    Vital Signs    Vitals:   08/11/19 0748 08/11/19 0900 08/11/19 1223 08/11/19 1256  BP: 91/66 95/66 (!) 105/59   Pulse:  100 90 75   Resp: 16 20 20    Temp: 98.6 F (37 C) 98 F (36.7 C) 98.3 F (36.8 C)   TempSrc: Oral Oral    SpO2: 100% (!) 88% 100% 94%  Weight:      Height:        Intake/Output Summary (Last 24 hours) at 08/11/2019 1459 Last data filed at 08/11/2019 1256 Gross per 24 hour  Intake --  Output 1550 ml  Net -1550 ml   Filed Weights   08/09/19 1534 08/10/19 0800 08/10/19 1258  Weight: 3.901 kg 39.9 kg 38.1 kg    Physical Exam    GEN: Well nourished, well developed, in no acute distress.  HEENT: normal.  Neck: Supple, no JVD, carotid bruits, or masses. Cardiac: Irregular regular rate Respiratory:  Respirations regular and unlabored, clear to auscultation bilaterally. GI: Soft, nontender, nondistended, BS + x 4. MS: no deformity or atrophy. Skin: warm and dry, no rash. Neuro:  Strength and sensation are intact. Psych: Normal affect.  Labs    CBC Recent Labs    08/09/19 1548 08/11/19 0558  WBC 6.1 5.9  NEUTROABS 4.5  --   HGB 11.8* 10.4*  HCT 36.8 32.4*  MCV 91.5 91.0  PLT 185 443*   Basic Metabolic Panel Recent Labs    08/10/19 0334 08/11/19 0558  NA 139 139  K 4.7 4.5  CL 95* 93*  CO2 36* 37*  GLUCOSE 97 86  BUN 32* 29*  CREATININE 1.29* 1.22*  CALCIUM 8.6* 8.6*  MG 2.2 2.1   Liver Function Tests Recent Labs    08/09/19 1548  AST 34  ALT 16  ALKPHOS 90  BILITOT 0.7  PROT 7.0  ALBUMIN 3.7   No results for input(s): LIPASE, AMYLASE in the last 72 hours. Cardiac Enzymes No results for input(s): CKTOTAL, CKMB, CKMBINDEX, TROPONINI in the last 72 hours. BNP Recent Labs    08/09/19 1548  BNP 3,071.2*   D-Dimer No results for input(s): DDIMER in the last 72 hours. Hemoglobin A1C No results for input(s): HGBA1C in the last 72 hours. Fasting Lipid Panel No results for input(s): CHOL, HDL, LDLCALC, TRIG, CHOLHDL, LDLDIRECT in the last 72 hours. Thyroid Function Tests No results for input(s): TSH, T4TOTAL, T3FREE, THYROIDAB in the last 72  hours.  Invalid input(s): FREET3  Telemetry    Atrial fibrillation with rapid ventricular response  ECG    Atrial fibrillation with interventricular conduction delay no ischemia  Radiology    DG Chest 2 View  Result Date: 08/09/2019 CLINICAL DATA:  Cough.  Chronic shortness of breath. EXAM: CHEST - 2 VIEW COMPARISON:  Chest x-ray 07/31/2015 and chest CT 11/04/2018 FINDINGS: The heart is mildly enlarged but stable. Stable tortuosity and calcification of the thoracic aorta. Stable pacer wires. Stable surgical changes from bypass surgery. Stable emphysematous changes and pulmonary scarring. No definite acute overlying pulmonary process. No pneumothorax. Suspect small effusions. IMPRESSION: Chronic emphysematous changes and pulmonary scarring but no definite acute overlying pulmonary process. Small effusions. Electronically Signed   By: Marijo Sanes M.D.   On: 08/09/2019 16:36   US Venous Img Lower Unilateral Right (DVT)  Result Date: 08/10/2019 CLINICAL DATA:  Right lower extremity pain and swelling increased over the past several weeks EXAM: RIGHT LOWER EXTREMITY VENOUS DOPPLER ULTRASOUND TECHNIQUE: Gray-scale sonography with compression, as well as color and duplex ultrasound, were performed to evaluate the deep venous system(s) from the level of the common femoral vein through the popliteal and proximal calf veins. COMPARISON:  None. FINDINGS: VENOUS Normal compressibility of the common femoral, superficial femoral, and popliteal veins, as well as the visualized calf veins. Visualized portions of profunda femoral vein and great saphenous vein unremarkable. No filling defects to suggest DVT on grayscale or color Doppler imaging. Doppler waveforms show normal direction of venous flow, normal respiratory plasticity and response to augmentation. Limited views of the contralateral common femoral vein are unremarkable. OTHER None. Limitations: none IMPRESSION: No evidence of deep venous thrombosis in  the right leg. Electronically Signed   By: Inez Catalina M.D.   On: 08/10/2019 01:58   ECHOCARDIOGRAM COMPLETE  Result Date: 08/10/2019    ECHOCARDIOGRAM REPORT   Patient Name:   Sheryl Hughes Date of Exam: 08/10/2019 Medical Rec #:  [id number]     Height:       66.0 in Accession #:    [id number]    Weight:       88.0 lb Date of Birth:  [birth year]     BSA:          1.411 m Patient Age:    89 years      BP:           94/52 mmHg Patient Gender: F             HR:  77 bpm. Exam Location:  [facility] Procedure: 2D Echo, Color Doppler and Cardiac Doppler Indications:     I50.9 Congestive Heart Failure  History:         Patient has no prior history of Echocardiogram examinations.                  CHF, CAD, CKD; Risk Factors:Hypertension.  Sonographer:     Charmayne Sheer RDCS (AE) Referring Phys:  [id number] Collier Bullock Diagnosing Phys: Bartholome Bill MD  Sonographer Comments: Suboptimal subcostal window. IMPRESSIONS  1. Left ventricular ejection fraction, by estimation, is 25 to 30%. The left ventricle has severely decreased function. The left ventricle demonstrates global hypokinesis. The left ventricular internal cavity size was mildly dilated. Left ventricular diastolic parameters were normal.  2. Right ventricular systolic function is mildly reduced. The right ventricular size is moderately enlarged. There is moderately elevated pulmonary artery systolic pressure.  3. Left atrial size was moderately dilated.  4. The mitral valve is degenerative. Trivial mitral valve regurgitation.  5. Tricuspid valve regurgitation is mild to moderate.  6. The aortic valve is grossly normal. Aortic valve regurgitation is not visualized. FINDINGS  Left Ventricle: Left ventricular ejection fraction, by estimation, is 25 to 30%. The left ventricle has severely decreased function. The left ventricle demonstrates global hypokinesis. The left ventricular internal cavity size was mildly dilated. There is no left ventricular hypertrophy. Left  ventricular diastolic parameters were normal. Right Ventricle: The right ventricular size is moderately enlarged. No increase in right ventricular wall thickness. Right ventricular systolic function is mildly reduced. There is moderately elevated pulmonary artery systolic pressure. The tricuspid regurgitant velocity is 3.49 m/s, and with an assumed right atrial pressure of 10 mmHg, the estimated right ventricular systolic pressure is 51.0 mmHg. Left Atrium: Left atrial size was moderately dilated. Right Atrium: Right atrial size was normal in size. Pericardium: There is no evidence of pericardial effusion. Mitral Valve: The mitral valve is degenerative in appearance. Trivial mitral valve regurgitation. MV peak gradient, 8.9 mmHg. The mean mitral valve gradient is 4.0 mmHg. Tricuspid Valve: The tricuspid valve is grossly normal. Tricuspid valve regurgitation is mild to moderate. Aortic Valve: The aortic valve is grossly normal. Aortic valve regurgitation is not visualized. Aortic valve mean gradient measures 8.0 mmHg. Aortic valve peak gradient measures 16.3 mmHg. Aortic valve area, by VTI measures 0.99 cm. Pulmonic Valve: The pulmonic valve was not well visualized. Pulmonic valve regurgitation is trivial. Aorta: The aortic root is normal in size and structure. IAS/Shunts: The interatrial septum was not assessed. Additional Comments: A pacer wire is visualized.  LEFT VENTRICLE PLAX 2D LVIDd:         5.18 cm  Diastology LVIDs:         4.61 cm  LV e' lateral:   5.33 cm/s LV PW:         0.88 cm  LV E/e' lateral: 23.3 LV IVS:        0.64 cm  LV e' medial:    3.70 cm/s LVOT diam:     2.10 cm  LV E/e' medial:  33.6 LV SV:         36 LV SV Index:   26 LVOT Area:     3.46 cm  RIGHT VENTRICLE RV Basal diam:  3.37 cm LEFT ATRIUM             Index       RIGHT ATRIUM           Index LA  diam:        3.90 cm 2.76 cm/m  RA Area:     12.60 cm LA Vol (A2C):   53.8 ml 38.14 ml/m RA Volume:   30.30 ml  21.48 ml/m LA Vol (A4C):    53.0 ml 37.57 ml/m LA Biplane Vol: 54.2 ml 38.43 ml/m  AORTIC VALVE                    PULMONIC VALVE AV Area (Vmax):    1.05 cm     PV Vmax:       1.00 m/s AV Area (Vmean):   1.12 cm     PV Vmean:      66.400 cm/s AV Area (VTI):     0.99 cm     PV VTI:        0.149 m AV Vmax:           202.00 cm/s  PV Peak grad:  4.0 mmHg AV Vmean:          128.333 cm/s PV Mean grad:  2.0 mmHg AV VTI:            0.364 m AV Peak Grad:      16.3 mmHg AV Mean Grad:      8.0 mmHg LVOT Vmax:         61.30 cm/s LVOT Vmean:        41.500 cm/s LVOT VTI:          0.104 m LVOT/AV VTI ratio: 0.29  AORTA Ao Root diam: 3.00 cm MITRAL VALVE                TRICUSPID VALVE MV Area (PHT): 3.03 cm     TR Peak grad:   48.7 mmHg MV Peak grad:  8.9 mmHg     TR Vmax:        349.00 cm/s MV Mean grad:  4.0 mmHg MV Vmax:       1.49 m/s     SHUNTS MV Vmean:      91.1 cm/s    Systemic VTI:  0.10 m MV Decel Time: 251 msec     Systemic Diam: 2.10 cm MV E velocity: 124.25 cm/s MV A velocity: 51.40 cm/s MV E/A ratio:  2.42 Bartholome Bill MD Electronically signed by Bartholome Bill MD Signature Date/Time: 08/10/2019/12:36:32 PM    Final     Assessment & Plan     1. LIMA to LAD 10/30/2011 2. Mitral valve repair, s/p Maze with dual-chamber ppm 10/30/2011 3. Ischemic cardiomyopathy 4. Chronic systolic congestive heart failure 5. Paroxysmal atrial fibrillation 6. Upgrade to dual-chamber ICD 07/05/2012 7. Essential hypertension 8. Hyperlipidemia 9 . Pulmonary hypertension 10. Bilateral pleural effusions, thoracentesis 10/22/2014, 10/24/2014 11. Cough secondary to lisinopril  1.  Coronary artery disease-appears to have ruled out for myocardial infarction.  Mildly elevated troponin appears to be secondary to demand as well as congestive heart failure.  Continue with medical management to include aspirin 81 mg daily, will carefully increase metoprolol succinate to 37.5 mg daily and follow heart rate, blood pressure.  2.  Cardiomyopathy-AICD in place.   Device was recently interrogated and is functioning normally.  Agree with changing the metoprolol succinate as an alternative to tartrate given her cardiomyopathy.  Still somewhat tachycardic.   Not a candidate for Entresto secondary to ACE inhibitor.  Also relatively hypotensive. Low-sodium diet.  Follow BNP.  Ejection fraction recently evaluated to be at 20 to 25%.  This is been pretty stable  for the last 3 or 4 years.  3.  Atrial fibrillation-status post Maze procedure but has recurrent A. fib.  Anticoagulated with warfarin.  We will continue with this with an INR goal between 2 and 3.  Rate control with metoprolol.  4.  Renal insufficiency-appears to be at her baseline.  Will need to carefully follow electrolytes and renal function with diuresis.  Creatinine slightly improved to 1.222 from 1.29 today.  5.  Hyperlipidemia-continue with atorvastatin 20 mg daily.  Low-fat diet.  May be able to streamline medications and discontinue atorvastatin given her advanced age and relative anorexia however for now we will continue.  6.  Peripheral edema-evaluated with patient's daughter.  Has reasonable pulses in her posterior tibial zones.  Mild edema in her feet.  Slightly improved from admission in my opinion.  Signed, Javier Docker Hezekiah Veltre MD 08/11/2019, 2:59 PM  Pager: [phone]

## 2019-08-11 NOTE — Evaluation (Signed)
Occupational Therapy Evaluation Patient Details Name: Sheryl Hughes MRN: [id number] DOB: 02-Dec-1931 Today's Date: 08/11/2019    History of Present Illness Sheryl Hughes is a 86 y.o. female with medical history significant for paroxysmal atrial fibrillation, chronic systolic dysfunction CHF with last known LVEF of 20%, coronary artery disease, stage IV chronic kidney disease, essential hypertension, COPD and pulmonary hypertension who presents to the emergency room for evaluation of shortness of breath.   Clinical Impression   Pt was seen for OT evaluation this date. Prior to hospital admission, pt was MOD I with fxl mobility with use of 4WW, could complete g/h tasks and some other basic self care I'ly and required aide assist for IADLs such as cooking/cleaning. Pt's dtr who is present for start of session, reports that she has been requiring increased assist including with bathing, in the last month or so d/t increasing fatigue/decreasing endurance. Pt lives with her spouse who she reports is 68 with dementia, in a [facility] with 3 STE. Currently pt demonstrates impairments as described below (See OT problem list) which functionally limit her ability to perform ADL/self-care tasks. Pt currently requires MIN/MOD A for ADL transfers with RW as well as MIN/MOD A for LB ADLs.  Pt would benefit from skilled OT to address noted impairments and functional limitations (see below for any additional details) in order to maximize safety and independence while minimizing falls risk and caregiver burden. Upon hospital discharge, recommend STR to maximize pt safety and return to PLOF. Of note: pt expresses to this Pryor Curia that she feels she is getting to the point where she "just wants to be comfortable." OT relays this information to pt's daughter Clarisa Schools, verbally, and the palliative NP that presented this date-Shae Shaffer. Pt puts forth good effort, spO2 stays above 93% on RA throughout session, however, pt with severely low  fxl activity tolerance including tolerance for EOB sitting and transfer attempts (knees buckle on single attempt this session). Overall, SNF is likely safest d/c recommendation as of this date, but will continue to assess for progress versus pt's preferences and personal goals for care.    Follow Up Recommendations  SNF    Equipment Recommendations  Other (comment)(defer to next venue of care)    Recommendations for Other Services       Precautions / Restrictions Precautions Precautions: Fall Restrictions Weight Bearing Restrictions: No      Mobility Bed Mobility Overal bed mobility: Needs Assistance Bed Mobility: Supine to Sit;Sit to Supine     Supine to sit: Min assist;HOB elevated Sit to supine: Mod assist   General bed mobility comments: assist to manage LEs for back to bed  Transfers Overall transfer level: Needs assistance Equipment used: Rolling walker (2 wheeled) Transfers: Sit to/from Stand Sit to Stand: Min assist;From elevated surface         General transfer comment: sit to stand t/f short-lived as pt only makes it to full extension for ~1 second and then demos knee buckling, requiring assist for stand to sit transition    Balance Overall balance assessment: Needs assistance Sitting-balance support: Feet supported;Single extremity supported Sitting balance-Leahy Scale: Fair     Standing balance support: Bilateral upper extremity supported;During functional activity Standing balance-Leahy Scale: Poor Standing balance comment: knee buckling and req's MIN/MOD A t/o as well as RW for UE support                           ADL either performed  or assessed with clinical judgement   ADL Overall ADL's : Needs assistance/impaired                                       General ADL Comments: setup to MIN A with seated UB ADLs, MIN/MOD A with seated LB ADLs, unable to safely perform ADLs in standing at this time, 1 sit to stand attempt  on assessment was terminated d/t knee buckling.     Vision Baseline Vision/History: Wears glasses Patient Visual Report: No change from baseline       Perception     Praxis      Pertinent Vitals/Pain Pain Assessment: Faces Faces Pain Scale: Hurts a little bit Pain Location: patient reports she has been having leg/foot cramps last night and this morning Pain Descriptors / Indicators: Discomfort;Aching Pain Intervention(s): Limited activity within patient's tolerance;Monitored during session     Hand Dominance     Extremity/Trunk Assessment Upper Extremity Assessment Upper Extremity Assessment: Generalized weakness   Lower Extremity Assessment Lower Extremity Assessment: Generalized weakness   Cervical / Trunk Assessment Cervical / Trunk Assessment: Normal   Communication Communication Communication: No difficulties   Cognition Arousal/Alertness: Awake/alert Behavior During Therapy: WFL for tasks assessed/performed Overall Cognitive Status: Within Functional Limits for tasks assessed                                     General Comments       Exercises Other Exercises Other Exercises: OT facilitates ed re: role of OT in acute setting, EC strategies.   Shoulder Instructions      Home Living Family/patient expects to be discharged to:: Private residence Living Arrangements: Spouse/significant other Available Help at Discharge: Personal care attendant;Family Type of Home: House Home Access: Stairs to enter [employer] of Steps: 4 Entrance Stairs-Rails: Right Home Layout: One level     Bathroom Shower/Tub: Occupational psychologist: Standard     Home Equipment: Environmental consultant - 4 wheels;Cane - single point;Bedside commode          Prior Functioning/Environment Level of Independence: Needs assistance  Gait / Transfers Assistance Needed: patient uses rollator at baseline ADL's / Homemaking Assistance Needed: patient and husband  have PCA that comes 2x per week for a few hours assists with meals, cleaning, and occasionally bathing            OT Problem List: Decreased strength;Decreased activity tolerance;Impaired balance (sitting and/or standing);Cardiopulmonary status limiting activity      OT Treatment/Interventions: Self-care/ADL training;Therapeutic exercise;Energy conservation;DME and/or AE instruction;Therapeutic activities;Balance training;Patient/family education    OT Goals(Current goals can be found in the care plan section) Acute Rehab OT Goals Patient Stated Goal: "to just be comfortable" OT Goal Formulation: With patient Time For Goal Achievement: 08/25/19 Potential to Achieve Goals: Good  OT Frequency: Min 1X/week   Barriers to D/C:            Co-evaluation              AM-PAC OT "6 Clicks" Daily Activity     Outcome Measure Help from another person eating meals?: A Little Help from another person taking care of personal grooming?: A Little Help from another person toileting, which includes using toliet, bedpan, or urinal?: A Lot Help from another person bathing (including washing, rinsing, drying)?: A Lot  Help from another person to put on and taking off regular upper body clothing?: A Little Help from another person to put on and taking off regular lower body clothing?: A Lot 6 Click Score: 15   End of Session Equipment Utilized During Treatment: Gait belt;Rolling walker Nurse Communication: Mobility status  Activity Tolerance: Patient limited by fatigue Patient left: in bed;with call bell/phone within reach;with family/visitor present  OT Visit Diagnosis: Unsteadiness on feet (R26.81);Muscle weakness (generalized) (M62.81)                Time: 7342-8768 OT Time Calculation (min): 42 min Charges:  OT General Charges $OT Visit: 1 Visit OT Evaluation $OT Eval Moderate Complexity: 1 Mod OT Treatments $Self Care/Home Management : 8-22 mins $Therapeutic Activity: 8-22  mins  Gerrianne Scale, MS, OTR/L ascom [phone] 08/11/19, 5:28 PM

## 2019-08-11 NOTE — Progress Notes (Signed)
[facility]) Hospital Liaison RN note  This patient has been referred to [facility] for outpatient palliative services at the time of discharge.  Will follow for disposition.  Please call with any questions or concerns.  Thank you. Margaretmary Eddy, BSN, RN [facility] Liaison [phone]

## 2019-08-11 NOTE — Plan of Care (Signed)
  Problem: Health Behavior/Discharge Planning: °Goal: Ability to manage health-related needs will improve °Outcome: Progressing °  °Problem: Clinical Measurements: °Goal: Respiratory complications will improve °Outcome: Progressing °  °Problem: Safety: °Goal: Ability to remain free from injury will improve °Outcome: Progressing °  °

## 2019-08-11 NOTE — Plan of Care (Signed)
  Problem: Pain Managment: Goal: General experience of comfort will improve Outcome: Progressing   

## 2019-08-11 NOTE — Progress Notes (Addendum)
CCMD called and reported that pt HR sustaining at 124 to 140's .Pt was asytomatic. VSS except HR. Notify NP Randol Kern. Will contineu to monitor.  Update 2152: NP Morrision order one time dose metropolol 5 mg IV injection. HR after IV metoprolol stays at 115-120. Will continue to monitor.  Update 2312: Pt HR seems to stay at 121-130. Notify B Morrison. Will continue to monitor.  Update 0027: CCM called and reported that pts jsut have 14 beats of non sustained vtach. On assesment pt was asleep. VSS except BP 95/61 (MAP 70) and HR 114. Notify NP morrison. Will continue to monitor.  Update 0035: Talked to B Randol Kern no new order was place. Will continue to monitor.

## 2019-08-11 NOTE — Progress Notes (Signed)
Initial Nutrition Assessment  DOCUMENTATION CODES:   Severe malnutrition in context of chronic illness  INTERVENTION:   Ensure Enlive po daily (1500), provides 350 kcal and 20 grams of protein (pt prefers this over chocolate ice cream)  Magic cup BID with lunch and dinner, each supplement provides 290 kcal and 9 grams of protein  Vital Cuisine BID with lunch and dinner, each supplement provides 520kcal and 22g of protein.   MVI daily   Liberalize diet   NUTRITION DIAGNOSIS:   Severe Malnutrition related to chronic illness(COPD, CHF) as evidenced by severe muscle depletion, severe fat depletion.  GOAL:   Patient will meet greater than or equal to 90% of their needs  MONITOR:   PO intake, Supplement acceptance, Labs, Weight trends, Skin, I & O's  REASON FOR ASSESSMENT:   Other (Comment)(Low BMI)    ASSESSMENT:   86 y.o. female with medical history of paroxysmal atrial fibrillation, chronic systolic dysfunction CHF with last known LVEF of 20%, coronary artery disease, stage IV chronic kidney disease, essential hypertension, COPD and pulmonary hypertension who presented to the ED on 08/09/19 for evaluation of shortness of breath and lower extremity swelling.   Met with pt and pt's daughter in room today. Pt reports eating only sips and bites of her breakfast this morning but reports that she did eat pretty well at dinner last night. Pt did eat some applesauce with her meds this morning. Pt has ordered potato soup and cake for lunch today. Per daughter, pt "eats like a bird" at baseline. Pt does not drink any supplements at home and is very reluctant to try them in hospital. Pt reports that a typical day for her would include cereal or toast for breakfast, fruit with yogurt ~10am as a snack, a light lunch, chocolate ice cream every evening around 3pm and a light supper. Pt reports that she is willing to try Ensure poured over chocolate ice cream as a 3pm snack. RD will add supplements  to meal trays. RD will also add MVI daily and liberalize pt's diet as pt is not eating enough to exceed sodium limits. Per chart, pt down 10lbs(11%) in < 1 year; this is significant as pt is already underweight and severely malnourished.    Medications reviewed and include: aspirin, vitamin D, lasix, protonix, miralax, KCl, B12, warfarin  Labs reviewed: BUN 29(H), creat 1.22(H), Mg 2.1 wnl Hgb 10.4(L), Hct 32.4(L) BNP 3071(H)- 6/2  NUTRITION - FOCUSED PHYSICAL EXAM:    Most Recent Value  Orbital Region  Moderate depletion  Upper Arm Region  Severe depletion  Thoracic and Lumbar Region  Severe depletion  Buccal Region  Moderate depletion  Temple Region  Severe depletion  Clavicle Bone Region  Severe depletion  Clavicle and Acromion Bone Region  Severe depletion  Scapular Bone Region  Severe depletion  Dorsal Hand  Severe depletion  Patellar Region  Severe depletion  Anterior Thigh Region  Severe depletion  Posterior Calf Region  Severe depletion  Edema (RD Assessment)  Mild  Hair  Reviewed  Eyes  Reviewed  Mouth  Reviewed  Skin  Reviewed  Nails  Reviewed     Diet Order:   Diet Order            Diet regular Room service appropriate? Yes; Fluid consistency: Thin  Diet effective now             EDUCATION NEEDS:   Education needs have been addressed  Skin:  Skin Assessment: Reviewed RN Assessment(ecchymosis)  Last BM:  6/2  Height:   Ht Readings from Last 1 Encounters:  08/10/19 _0  (1.626 m)    Weight:   Wt Readings from Last 1 Encounters:  08/10/19 38.1 kg    Ideal Body Weight:  54.5 kg  BMI:  Body mass index is 14.42 kg/m.  Estimated Nutritional Needs:   Kcal:  1100-1300kcal/day  Protein:  55-65g/day  Fluid:  1L/day  Koleen Distance MS, RD, LDN Please refer to [facility] for RD and/or RD on-call/weekend/after hours pager

## 2019-08-11 NOTE — Care Management Important Message (Signed)
Important Message  Patient Details  Name: Sheryl Hughes MRN: [id number] Date of Birth: 11/05/1931   Medicare Important Message Given:  Yes  Initial Medicare IM given by Patient Access Associate on 08/10/2019 at 1:43pm.     Dannette Barbara 08/11/2019, 9:04 AM

## 2019-08-11 NOTE — Consult Note (Signed)
Consultation Note Date: 08/11/2019   Patient Name: Sheryl Hughes  DOB: [birth year]  MRN: [id number]  Age / Sex: 86 y.o., female  PCP: Kirk Ruths, MD Referring Physician: Ezekiel Slocumb, DO  Reason for Consultation: Establishing goals of care  HPI/Patient Profile: 86 y.o. female  with past medical history of a fib, CHF with EF 20%, CAD, CKD IV, HTN, COPD, and pulmonary htn admitted on 08/09/2019 with shortness of breath and lower extremity edema. Also complains of increasing fatigue. Diagnosed with acute on chronic CHF.  Patient and family have expressed desire to ensure comfort. PMT consulted to discuss [place].  Clinical Assessment and Goals of Care: I have reviewed medical records including EPIC notes, labs and imaging, received report from Dr. Arbutus Ped, assessed the patient and then met with patient's daughter, [name],  to discuss diagnosis prognosis, [facility], EOL wishes, disposition and options.  I introduced Palliative Medicine as specialized medical care for people living with serious illness. It focuses on providing relief from the symptoms and stress of a serious illness. The goal is to improve quality of life for both the patient and the family.  Patient oriented however she was working with OT. Daughter requested she and I speak privately and patient agreed.   Daughter shares patient's spouse has dementia. They have private caregivers in the home for a few hours a few days per week.  As far as functional and nutritional status, daughter tells me patient has had some loss of cognitive function - easily confused. She tells me patient ambulates with walker but is weak and spends most of her time sleeping. She has had a ten pound weight loss in the past year, BMI is 14. She has difficulty completing ADLs independently. She has a poor appetite - recently has experienced trouble swallowing. I offered SLP eval to daughter however she tells me this  was worked up a few months ago and she was not interested in pursuing further evaluation.    We discussed patient's current illness and what it means in the larger context of patient's on-going co-morbidities.  Natural disease trajectory and expectations at EOL were discussed. Daughter has good understanding of heart failure and patient's frailty. She discusses patient's continued decline.   I attempted to elicit values and goals of care important to the patient.  Daughter tells me the patient just wants to be comfortable and daughter supports this goal.  Discussed with patient/family the importance of continued conversation with family and the medical providers regarding overall plan of care and treatment options, ensuring decisions are within the context of the patient's values and GOCs.    Hospice and Palliative Care services outpatient were explained and offered. Discussed hospice care vs palliative care outpatient. Daughter is familiar with hospice services as they have had other family members with these services. She is interested in patient going to rehab to see if she can gain any strength. We discuss that palliative could follow at rehab and patient can transition to hospice when ready.   Questions and concerns were addressed. The family was encouraged to call with questions or concerns.   Primary Decision Maker PATIENT, joined by daughter    Willacy   - outpatient palliative to follow, discussed when transition to hospice would be appropriate - daughter interested in short-term rehab   Code Status/Advance Care Planning:  DNR  Prognosis:   Unable to determine  Discharge Planning: [place] for rehab with Palliative care service follow-up  Primary Diagnoses: Present on Admission: . CAD (coronary artery disease) . Chronic kidney disease (CKD), stage IV (severe) ([place]) . Pulmonary hypertension ([place]) . Acute on chronic systolic CHF  (congestive heart failure) ([place]) . Atrial fibrillation ([place]) . (Resolved) Acute on chronic systolic (congestive) heart failure ([place]) . COPD (chronic obstructive pulmonary disease) ([place])   I have reviewed the medical record, interviewed the patient and family, and examined the patient. The following aspects are pertinent.  Past Medical History:  Diagnosis Date  . Atrial fibrillation ([place])   . CAD (coronary artery disease)   . CHF (congestive heart failure) ([place])   . CKD (chronic kidney disease)   . GERD (gastroesophageal reflux disease)   . Hypertension   . Pulmonary hypertension ([place])   . Shingles    Social History   Socioeconomic History  . Marital status: Married    Spouse name: Not on file  . Number of children: Not on file  . Years of education: Not on file  . Highest education level: Not on file  Occupational History  . Not on file  Tobacco Use  . Smoking status: Never Smoker  . Smokeless tobacco: Never Used  Substance and Sexual Activity  . Alcohol use: No    Alcohol/week: 0.0 standard drinks  . Drug use: No  . Sexual activity: Not on file  Other Topics Concern  . Not on file  Social History Narrative  . Not on file   Social Determinants of Health   Financial Resource Strain:   . Difficulty of Paying Living Expenses:   Food Insecurity:   . Worried About Charity fundraiser in the Last Year:   . Arboriculturist in the Last Year:   Transportation Needs:   . Film/video editor (Medical):   Marland Kitchen Lack of Transportation (Non-Medical):   Physical Activity:   . Days of Exercise per Week:   . Minutes of Exercise per Session:   Stress:   . Feeling of Stress :   Social Connections:   . Frequency of Communication with Friends and Family:   . Frequency of Social Gatherings with Friends and Family:   . Attends Religious Services:   . Active Member of Clubs or Organizations:   . Attends Archivist Meetings:   Marland Kitchen Marital Status:    Family History    Problem Relation Age of Onset  . Heart attack Mother   . Heart attack Father   . COPD Father   . Ulcers Father   . Atrial fibrillation Sister   . Lung cancer Sister   . Heart attack Brother   . Aortic stenosis Brother   . CAD Brother    Scheduled Meds: . aspirin EC  81 mg Oral Daily  . atorvastatin  20 mg Oral QHS  . cholecalciferol  2,000 Units Oral Daily  . docusate sodium  100 mg Oral BID  . feeding supplement (ENSURE ENLIVE)  237 mL Oral Q24H  . furosemide  40 mg Intravenous BID  . metoprolol succinate  25 mg Oral Daily  . mometasone-formoterol  2 puff Inhalation BID  . [START ON 08/12/2019] multivitamin with minerals  1 tablet Oral Daily  . nystatin cream   Topical BID  . pantoprazole  40 mg Oral Daily  . polyethylene glycol  17 g Oral Daily  . potassium chloride  10 mEq Oral Daily  . sodium chloride flush  3 mL Intravenous Q12H  . vitamin B-12  1,000 mcg Oral Daily  .  warfarin  2.5 mg Oral Daily  . Warfarin - Physician Dosing Inpatient   Does not apply q1600   Continuous Infusions: . sodium chloride     PRN Meds:.sodium chloride, acetaminophen, ipratropium-albuterol, methocarbamol, ondansetron (ZOFRAN) IV, sodium chloride flush Allergies  Allergen Reactions  . Biaxin [Clarithromycin] Other (See Comments)    Reaction:  Numbness of lips   . Flagyl [Metronidazole] Other (See Comments)    Reaction:  Numbness of lips   . Penicillins Rash and Other (See Comments)    Pt is unable to answer additional questions about this medication because it happened so long ago.  . Tetracyclines & Related Rash   Review of Systems  Constitutional: Positive for activity change and fatigue.  Respiratory: Negative for shortness of breath.     Physical Exam Constitutional:      General: She is not in acute distress. Pulmonary:     Effort: Pulmonary effort is normal. No tachypnea.  Skin:    General: Skin is warm and dry.  Neurological:     Mental Status: She is alert and oriented  to person, place, and time.     Vital Signs: BP (!) 105/59 (BP Location: Right Arm)   Pulse 75   Temp 98.3 F (36.8 C)   Resp 20   Ht _0  (1.626 m)   Wt 38.1 kg   SpO2 94%   BMI 14.42 kg/m  Pain Scale: 0-10   Pain Score: 0-No pain   SpO2: SpO2: 94 % O2 Device:SpO2: 94 % O2 Flow Rate: .O2 Flow Rate (L/min): 3 L/min  IO: Intake/output summary:   Intake/Output Summary (Last 24 hours) at 08/11/2019 1430 Last data filed at 08/11/2019 1256 Gross per 24 hour  Intake --  Output 1550 ml  Net -1550 ml    LBM: Last BM Date: 08/09/19 Baseline Weight: Weight: 3.901 kg Most recent weight: Weight: 38.1 kg     Palliative Assessment/Data: PPS 50%    Time Total: 50 minutes Greater than 50%  of this time was spent counseling and coordinating care related to the above assessment and plan.  Juel Burrow, DNP, AGNP-C Palliative Medicine Team [phone] Pager: [phone]

## 2019-08-11 NOTE — Evaluation (Signed)
Physical Therapy Evaluation Patient Details Name: Sheryl Hughes MRN: [id number] DOB: May 09, 1931 Today's Date: 08/11/2019   History of Present Illness  Sheryl Hughes is a 86 y.o. female with medical history significant for paroxysmal atrial fibrillation, chronic systolic dysfunction CHF with last known LVEF of 20%, coronary artery disease, stage IV chronic kidney disease, essential hypertension, COPD and pulmonary hypertension who presents to the emergency room for evaluation of shortness of breath.  Clinical Impression  Patient received in bed, daughter present. She reports she is not feeling too well. Daughter and patient reports she had bad leg cramps last night and some this morning. She is agreeable to PT assessment. Patient is on 3 lpm O2 via [place] O2 sats in mid 90%s. Removed O2 while in bed and sats remained at 94%. She requires min assist for supine to sit and min assist with sit to stand from elevated bed. She was able to ambulate 12 feet with RW and min assist. Slow cadence, reports sob, difficulty getting saturation level after walking due to poor circulation and cold hands. When returned to supplemental O2 in the recliner her sats were 100%.  She will continue to benefit from skilled PT while here to improve strength, safety and functional independence.     Follow Up Recommendations SNF;Supervision/Assistance - 24 hour    Equipment Recommendations  None recommended by PT    Recommendations for Other Services       Precautions / Restrictions Precautions Precautions: Fall Restrictions Weight Bearing Restrictions: No      Mobility  Bed Mobility Overal bed mobility: Needs Assistance Bed Mobility: Supine to Sit     Supine to sit: Min assist     General bed mobility comments: uses bed rail and requires increased time for mobility. Min assist to raise trunk to sitting position  Transfers Overall transfer level: Needs assistance Equipment used: Rolling walker (2  wheeled) Transfers: Sit to/from Stand Sit to Stand: Min assist            Ambulation/Gait Ambulation/Gait assistance: Min Web designer (Feet): 12 Feet Assistive device: Rolling walker (2 wheeled) Gait Pattern/deviations: Step-to pattern;Decreased stride length;Shuffle;Decreased step length - left;Decreased step length - right;Narrow base of support Gait velocity: decreased   General Gait Details: Patient is unsteady with initial standing and ambulation, requires min assist and RW for safety.  Stairs            Wheelchair Mobility    Modified Rankin (Stroke Patients Only)       Balance Overall balance assessment: Needs assistance Sitting-balance support: Feet supported Sitting balance-Leahy Scale: Fair Sitting balance - Comments: requires supervision for sitting edge of bed   Standing balance support: Bilateral upper extremity supported;During functional activity Standing balance-Leahy Scale: Fair Standing balance comment: reliant on RW and external assist needed                             Pertinent Vitals/Pain Pain Assessment: Faces Faces Pain Scale: Hurts a little bit Pain Location: patient reports she has been having leg/foot cramps last night and this morning Pain Descriptors / Indicators: Discomfort;Aching Pain Intervention(s): Monitored during session;Repositioned    Home Living Family/patient expects to be discharged to:: Private residence Living Arrangements: Spouse/significant other Available Help at Discharge: Personal care attendant;Family Type of Home: House Home Access: Stairs to enter Entrance Stairs-Rails: Right   Home Layout: One level Home Equipment: [place] - 4 wheels;Cane - single point;Bedside commode  Prior Function Level of Independence: Needs assistance   Gait / Transfers Assistance Needed: patient uses rollator at baseline  ADL's / Homemaking Assistance Needed: patient and husband have PCA that comes 2x  per week for a few hours assists with meals, cleaning        Hand Dominance        Extremity/Trunk Assessment   Upper Extremity Assessment Upper Extremity Assessment: Generalized weakness    Lower Extremity Assessment Lower Extremity Assessment: Generalized weakness    Cervical / Trunk Assessment Cervical / Trunk Assessment: Normal  Communication   Communication: No difficulties  Cognition Arousal/Alertness: Awake/alert Behavior During Therapy: WFL for tasks assessed/performed Overall Cognitive Status: Within Functional Limits for tasks assessed                                        General Comments      Exercises     Assessment/Plan    PT Assessment Patient needs continued PT services  PT Problem List Decreased strength;Decreased mobility;Decreased safety awareness;Decreased activity tolerance;Decreased balance;Cardiopulmonary status limiting activity       PT Treatment Interventions Therapeutic exercise;Gait training;Balance training;Functional mobility training;Therapeutic activities;Patient/family education    PT Goals (Current goals can be found in the Care Plan section)  Acute Rehab PT Goals Patient Stated Goal: to get more help at home PT Goal Formulation: With patient/family Time For Goal Achievement: 08/25/19 Potential to Achieve Goals: Good    Frequency Min 2X/week   Barriers to discharge Decreased caregiver support      Co-evaluation               AM-PAC PT "6 Clicks" Mobility  Outcome Measure Help needed turning from your back to your side while in a flat bed without using bedrails?: A Little Help needed moving from lying on your back to sitting on the side of a flat bed without using bedrails?: A Little Help needed moving to and from a bed to a chair (including a wheelchair)?: A Little Help needed standing up from a chair using your arms (e.g., wheelchair or bedside chair)?: A Lot Help needed to walk in hospital  room?: A Lot Help needed climbing 3-5 steps with a railing? : A Lot 6 Click Score: 15    End of Session Equipment Utilized During Treatment: Gait belt;Oxygen Activity Tolerance: Patient limited by fatigue Patient left: in chair;with chair alarm set;with call bell/phone within reach;with family/visitor present Nurse Communication: Mobility status PT Visit Diagnosis: Unsteadiness on feet (R26.81);Muscle weakness (generalized) (M62.81);Difficulty in walking, not elsewhere classified (R26.2)    Time: 1200-1230 PT Time Calculation (min) (ACUTE ONLY): 30 min   Charges:   PT Evaluation $PT Eval Moderate Complexity: 1 Mod PT Treatments $Gait Training: 8-22 mins        Michae Grimley, PT, GCS 08/11/19,1:13 PM

## 2019-08-12 ENCOUNTER — Inpatient Hospital Stay: Payer: Medicare Other

## 2019-08-12 LAB — CBC WITH DIFFERENTIAL/PLATELET
Abs Immature Granulocytes: 0.01 10*3/uL (ref 0.00–0.07)
Basophils Absolute: 0 10*3/uL (ref 0.0–0.1)
Basophils Relative: 0 %
Eosinophils Absolute: 0 10*3/uL (ref 0.0–0.5)
Eosinophils Relative: 0 %
HCT: 35.4 % — ABNORMAL LOW (ref 36.0–46.0)
Hemoglobin: 11 g/dL — ABNORMAL LOW (ref 12.0–15.0)
Immature Granulocytes: 0 %
Lymphocytes Relative: 8 %
Lymphs Abs: 0.5 10*3/uL — ABNORMAL LOW (ref 0.7–4.0)
MCH: 28.7 pg (ref 26.0–34.0)
MCHC: 31.1 g/dL (ref 30.0–36.0)
MCV: 92.4 fL (ref 80.0–100.0)
Monocytes Absolute: 0.9 10*3/uL (ref 0.1–1.0)
Monocytes Relative: 15 %
Neutro Abs: 4.4 10*3/uL (ref 1.7–7.7)
Neutrophils Relative %: 77 %
Platelets: 148 10*3/uL — ABNORMAL LOW (ref 150–400)
RBC: 3.83 MIL/uL — ABNORMAL LOW (ref 3.87–5.11)
RDW: 16.2 % — ABNORMAL HIGH (ref 11.5–15.5)
WBC: 5.8 10*3/uL (ref 4.0–10.5)
nRBC: 0 % (ref 0.0–0.2)

## 2019-08-12 LAB — BASIC METABOLIC PANEL
Anion gap: 11 (ref 5–15)
BUN: 26 mg/dL — ABNORMAL HIGH (ref 8–23)
CO2: 35 mmol/L — ABNORMAL HIGH (ref 22–32)
Calcium: 9.1 mg/dL (ref 8.9–10.3)
Chloride: 92 mmol/L — ABNORMAL LOW (ref 98–111)
Creatinine, Ser: 1.2 mg/dL — ABNORMAL HIGH (ref 0.44–1.00)
GFR calc Af Amer: 48 mL/min — ABNORMAL LOW (ref >60)
GFR calc non Af Amer: 41 mL/min — ABNORMAL LOW (ref >60)
Glucose, Bld: 98 mg/dL (ref 70–99)
Potassium: 4.3 mmol/L (ref 3.5–5.1)
Sodium: 138 mmol/L (ref 135–145)

## 2019-08-12 LAB — BRAIN NATRIURETIC PEPTIDE: B Natriuretic Peptide: 1986.5 pg/mL — ABNORMAL HIGH (ref 0.0–100.0)

## 2019-08-12 LAB — PROTIME-INR
INR: 2.2 — ABNORMAL HIGH (ref 0.8–1.2)
Prothrombin Time: 23.4 seconds — ABNORMAL HIGH (ref 11.4–15.2)

## 2019-08-12 LAB — MAGNESIUM: Magnesium: 2.1 mg/dL (ref 1.7–2.4)

## 2019-08-12 MED ORDER — METOPROLOL TARTRATE 5 MG/5ML IV SOLN
5.0000 mg | Freq: Once | INTRAVENOUS | Status: AC
  Administered 2019-08-12: 5 mg via INTRAVENOUS
  Filled 2019-08-12: qty 5

## 2019-08-12 MED ORDER — MELATONIN 5 MG PO TABS
2.5000 mg | ORAL_TABLET | Freq: Every day | ORAL | Status: DC
  Administered 2019-08-12 – 2019-08-13 (×2): 2.5 mg via ORAL
  Filled 2019-08-12 (×2): qty 1

## 2019-08-12 MED ORDER — MIRTAZAPINE 15 MG PO TABS
7.5000 mg | ORAL_TABLET | Freq: Every day | ORAL | Status: DC
  Administered 2019-08-12: 7.5 mg via ORAL
  Filled 2019-08-12: qty 1

## 2019-08-12 MED ORDER — DIGOXIN 0.25 MG/ML IJ SOLN
0.2500 mg | Freq: Once | INTRAMUSCULAR | Status: AC
  Administered 2019-08-12: 0.25 mg via INTRAVENOUS
  Filled 2019-08-12: qty 2

## 2019-08-12 NOTE — Progress Notes (Signed)
PROGRESS NOTE    Sheryl Hughes   YKZ:[id number]  DOB: 1931-04-13  PCP: Kirk Ruths, MD    DOA: 08/09/2019 LOS: 3   Brief Narrative   Sheryl Hughes is a 86 y.o. female with medical history of paroxysmal atrial fibrillation, chronic systolic dysfunction CHF with last known LVEF of 20%, coronary artery disease, stage IV chronic kidney disease, essential hypertension, COPD and pulmonary hypertension who presented to the ED on 08/09/19 for evaluation of shortness of breath and lower extremity swelling.  She reported recent increased diuretic doses in outpatient setting, but has not noted improvement.  She reports recently that she is sleeping a lot and has no energy, wonders if she needs supplemental oxygen.  Chest xray showed chronic emphysematous changes and pulmonary scarring but no definite acute overlying pulmonary process. Small effusions.  EKG showed a ventricular paced rhythm.  Labs reveal mildly elevated troponin level as well as BNP level > 3000.  Admitted to hospitalist service with cardiology consulted, undergoing IV diuresis.     Assessment & Plan   Principal Problem:   Acute on chronic systolic CHF (congestive heart failure) (HCC) Active Problems:   Atrial fibrillation (HCC)   Chronic kidney disease (CKD), stage IV (severe) (HCC)   Pulmonary hypertension (HCC)   COPD (chronic obstructive pulmonary disease) (HCC)   CAD (coronary artery disease)   Protein-calorie malnutrition, severe   Goals of care, counseling/discussion   Palliative care by specialist   Acute on chronic systolic CHF - POA with SOB/DOE and edema. Echo showed EF of 20-25% with global hypokinesis.  Unclear net fluid balance. --Cardiology following --Continue IV diuresis --strict I/O's and daily weights --monitor renal function & electrolytes closely --Palliative care consulted   A-fib with RVR - rates up to 150's-160's today.  Given IV metop, followed by digoxin, with improvement to the 100's to  110's.    Chronic Atrial fibrillation - Continue warfarin for anticoagulation, pharmacy consulted for dosing.  Daily INR's, therpeutic.  Continue Toprol XL as BP tolerates, increased slightly for better HR control.  Monitor BP and HR.  Acute Respiratory Failure with Hypoxia - secondary to CHF and A-fib RVR, probable.atelectasis.  Monitor for signs of pneumonia, so far no fevers or leukocytosis.  Procal with AM labs.  CXR.  Supplemental O2 to keep O2 sat > 90%.  Incentive spirometer.   Depression  Anorexia --trial of low dose mirtazapine  CKD stage IV (severe) - no uremic symptoms.  Monitor renal function closely with diuresis.  Avoid nephrotoxins and hypotension as much as possible.    Pulmonary hypertension - contributes to primary problem above.  Monitor.  Supplemental O2 as needed to keep O2 sat > 90%.  COPD - not acutely exacerbated, has no wheezing on exam, but does contribute to her symptoms.  Appears not to be on bronchodilators outpatient.  Dulera BID.  Duonebs PRN.    Coronary Artery Disease - stable, no active chest pain.  Continue ASA and statin.  GERD - continue PPI  Esophageal dysmotility - likely due to long history of severe reflux.  Occasional regurgitation of food or medications.  Monitor closely.  Dysphagia - intermittent, with swallow pills.  SLP evaluated.  Mechanical soft diet recommended.  Meds in applesauce.    Foot cramping - electrolytes are okay, unclear cause.  Onset this afternoon.  Improved with trial of Robaxin, ordered PRN.  DVT prophylaxis: on coumadin  Diet:  Diet Orders (From admission, onward)    Start  Ordered   08/11/19 1130  Diet regular Room service appropriate? Yes; Fluid consistency: Thin  Diet effective now    Comments: NO SALT PACKS ON TRAYS  Question Answer Comment  Room service appropriate? Yes   Fluid consistency: Thin      08/11/19 1130            Code Status: DNR    Subjective 08/12/19    Patient seen at bedside this  AM.  Patient and daughter met with palliative yesterday.  Plan to attempt rehab to see if she regains some strength before proceeding with hospice.  Discussed at length with patient this morning and with daughter present this afternoon.  Patient says unsure at this point about rehab.  Says she feels depressed which is unusual for her, so lacking motivation.    Patient got choke with swallowing morning medications.  She reports intermittent issues with swallow for some time, worse recently.      Disposition Plan & Communication   Status is: Inpatient  Remains inpatient appropriate because:IV treatments appropriate due to intensity of illness or inability to take PO   Dispo: The patient is from: Home              Anticipated d/c is to: SNF               Anticipated d/c date is: 2 days              Patient currently is not medically stable to d/c.   Family Communication: daughter updated at bedside this afternoon  Consults, Procedures, Significant Events   Consultants:   Cardiology  Procedures:   2D Echo 08/09/19   Objective   Vitals:   08/12/19 0200 08/12/19 0400 08/12/19 0629 08/12/19 0827  BP:   (!) 97/55 96/71  Pulse:   (!) 112 99  Resp: _0 Temp:   97.9 F (36.6 C) 97.9 F (36.6 C)  TempSrc:    Oral  SpO2:   94% 91%  Weight:   42.6 kg   Height:        Intake/Output Summary (Last 24 hours) at 08/12/2019 0902 Last data filed at 08/12/2019 0636 Gross per 24 hour  Intake --  Output 1850 ml  Net -1850 ml   Filed Weights   08/10/19 0800 08/10/19 1258 08/12/19 0629  Weight: 39.9 kg 38.1 kg 42.6 kg    Physical Exam:  General exam: awake, alert, no acute distress, frail, cachectic Respiratory system: decreased breath sounds, shallow inspirations, normal respiratory effort, on 2 L/min oxygen. Cardiovascular system: normal S1/S2, irregularly irregular, no pedal edema.   Central nervous system: A&O x3. no gross focal neurologic deficits, normal  speech Extremities: moves all, no edema, normal tone, right foot erythema due to fungus, distal left lower extremity  ecchymosis and erythema appears somewhat improved  Skin: dry, intact, normal temperature, pale Psychiatry: normal mood, congruent affect, judgement and insight appear normal  Labs   Data Reviewed: I have personally reviewed following labs and imaging studies  CBC: Recent Labs  Lab 08/09/19 1548 08/11/19 0558  WBC 6.1 5.9  NEUTROABS 4.5  --   HGB 11.8* 10.4*  HCT 36.8 32.4*  MCV 91.5 91.0  PLT 185 923*   Basic Metabolic Panel: Recent Labs  Lab 08/09/19 1548 08/10/19 0334 08/11/19 0558 08/12/19 0608  NA 135 139 139 138  K 4.8 4.7 4.5 4.3  CL 90* 95* 93* 92*  CO2 37* 36* 37* 35*  GLUCOSE 100*  97 86 98  BUN 36* 32* 29* 26*  CREATININE 1.30* 1.29* 1.22* 1.20*  CALCIUM 9.0 8.6* 8.6* 9.1  MG  --  2.2 2.1 2.1   GFR: Estimated Creatinine Clearance: 23.1 mL/min (A) (by C-G formula based on SCr of 1.2 mg/dL (H)). Liver Function Tests: Recent Labs  Lab 08/09/19 1548  AST 34  ALT 16  ALKPHOS 90  BILITOT 0.7  PROT 7.0  ALBUMIN 3.7   No results for input(s): LIPASE, AMYLASE in the last 168 hours. No results for input(s): AMMONIA in the last 168 hours. Coagulation Profile: Recent Labs  Lab 08/09/19 2010 08/10/19 0334 08/11/19 0558 08/12/19 0608  INR 2.3* 2.3* 2.2* 2.2*   Cardiac Enzymes: No results for input(s): CKTOTAL, CKMB, CKMBINDEX, TROPONINI in the last 168 hours. BNP (last 3 results) No results for input(s): PROBNP in the last 8760 hours. HbA1C: No results for input(s): HGBA1C in the last 72 hours. CBG: No results for input(s): GLUCAP in the last 168 hours. Lipid Profile: No results for input(s): CHOL, HDL, LDLCALC, TRIG, CHOLHDL, LDLDIRECT in the last 72 hours. Thyroid Function Tests: No results for input(s): TSH, T4TOTAL, FREET4, T3FREE, THYROIDAB in the last 72 hours. Anemia Panel: No results for input(s): VITAMINB12, FOLATE,  FERRITIN, TIBC, IRON, RETICCTPCT in the last 72 hours. Sepsis Labs: No results for input(s): PROCALCITON, LATICACIDVEN in the last 168 hours.  Recent Results (from the past 240 hour(s))  SARS Coronavirus 2 by RT PCR (hospital order, performed in [facility] hospital lab) Nasopharyngeal Nasopharyngeal Swab     Status: None   Collection Time: 08/09/19  8:10 PM   Specimen: Nasopharyngeal Swab  Result Value Ref Range Status   SARS Coronavirus 2 NEGATIVE NEGATIVE Final    Comment: (NOTE) SARS-CoV-2 target nucleic acids are NOT DETECTED. The SARS-CoV-2 RNA is generally detectable in upper and lower respiratory specimens during the acute phase of infection. The lowest concentration of SARS-CoV-2 viral copies this assay can detect is 250 copies / mL. A negative result does not preclude SARS-CoV-2 infection and should not be used as the sole basis for treatment or other patient management decisions.  A negative result may occur with improper specimen collection / handling, submission of specimen other than nasopharyngeal swab, presence of viral mutation(s) within the areas targeted by this assay, and inadequate number of viral copies (<250 copies / mL). A negative result must be combined with clinical observations, patient history, and epidemiological information. Fact Sheet for Patients:   StrictlyIdeas.no Fact Sheet for Healthcare Providers: BankingDealers.co.za This test is not yet approved or cleared  by the [place] FDA and has been authorized for detection and/or diagnosis of SARS-CoV-2 by FDA under an Emergency Use Authorization (EUA).  This EUA will remain in effect (meaning this test can be used) for the duration of the COVID-19 declaration under Section 564(b)(1) of the Act, 21 U.S.C. section 360bbb-3(b)(1), unless the authorization is terminated or revoked sooner. Performed at [facility], [address],  [place], [place] [address]       Imaging Studies   ECHOCARDIOGRAM COMPLETE  Result Date: 08/10/2019    ECHOCARDIOGRAM REPORT   Patient Name:   MAYKAYLA HIGHLEY Date of Exam: 08/10/2019 Medical Rec #:  [id number]     Height:       66.0 in Accession #:    [id number]    Weight:       88.0 lb Date of Birth:  [birth year]     BSA:  1.411 m Patient Age:    49 years      BP:           94/52 mmHg Patient Gender: F             HR:           77 bpm. Exam Location:  [facility] Procedure: 2D Echo, Color Doppler and Cardiac Doppler Indications:     I50.9 Congestive Heart Failure  History:         Patient has no prior history of Echocardiogram examinations.                  CHF, CAD, CKD; Risk Factors:Hypertension.  Sonographer:     Charmayne Sheer RDCS (AE) Referring Phys:  [id number] Collier Bullock Diagnosing Phys: Bartholome Bill MD  Sonographer Comments: Suboptimal subcostal window. IMPRESSIONS  1. Left ventricular ejection fraction, by estimation, is 25 to 30%. The left ventricle has severely decreased function. The left ventricle demonstrates global hypokinesis. The left ventricular internal cavity size was mildly dilated. Left ventricular diastolic parameters were normal.  2. Right ventricular systolic function is mildly reduced. The right ventricular size is moderately enlarged. There is moderately elevated pulmonary artery systolic pressure.  3. Left atrial size was moderately dilated.  4. The mitral valve is degenerative. Trivial mitral valve regurgitation.  5. Tricuspid valve regurgitation is mild to moderate.  6. The aortic valve is grossly normal. Aortic valve regurgitation is not visualized. FINDINGS  Left Ventricle: Left ventricular ejection fraction, by estimation, is 25 to 30%. The left ventricle has severely decreased function. The left ventricle demonstrates global hypokinesis. The left ventricular internal cavity size was mildly dilated. There is no left ventricular hypertrophy. Left ventricular diastolic parameters were  normal. Right Ventricle: The right ventricular size is moderately enlarged. No increase in right ventricular wall thickness. Right ventricular systolic function is mildly reduced. There is moderately elevated pulmonary artery systolic pressure. The tricuspid regurgitant velocity is 3.49 m/s, and with an assumed right atrial pressure of 10 mmHg, the estimated right ventricular systolic pressure is 56.8 mmHg. Left Atrium: Left atrial size was moderately dilated. Right Atrium: Right atrial size was normal in size. Pericardium: There is no evidence of pericardial effusion. Mitral Valve: The mitral valve is degenerative in appearance. Trivial mitral valve regurgitation. MV peak gradient, 8.9 mmHg. The mean mitral valve gradient is 4.0 mmHg. Tricuspid Valve: The tricuspid valve is grossly normal. Tricuspid valve regurgitation is mild to moderate. Aortic Valve: The aortic valve is grossly normal. Aortic valve regurgitation is not visualized. Aortic valve mean gradient measures 8.0 mmHg. Aortic valve peak gradient measures 16.3 mmHg. Aortic valve area, by VTI measures 0.99 cm. Pulmonic Valve: The pulmonic valve was not well visualized. Pulmonic valve regurgitation is trivial. Aorta: The aortic root is normal in size and structure. IAS/Shunts: The interatrial septum was not assessed. Additional Comments: A pacer wire is visualized.  LEFT VENTRICLE PLAX 2D LVIDd:         5.18 cm  Diastology LVIDs:         4.61 cm  LV e' lateral:   5.33 cm/s LV PW:         0.88 cm  LV E/e' lateral: 23.3 LV IVS:        0.64 cm  LV e' medial:    3.70 cm/s LVOT diam:     2.10 cm  LV E/e' medial:  33.6 LV SV:         36 LV SV Index:  26 LVOT Area:     3.46 cm  RIGHT VENTRICLE RV Basal diam:  3.37 cm LEFT ATRIUM             Index       RIGHT ATRIUM           Index LA diam:        3.90 cm 2.76 cm/m  RA Area:     12.60 cm LA Vol (A2C):   53.8 ml 38.14 ml/m RA Volume:   30.30 ml  21.48 ml/m LA Vol (A4C):   53.0 ml 37.57 ml/m LA Biplane Vol:  54.2 ml 38.43 ml/m  AORTIC VALVE                    PULMONIC VALVE AV Area (Vmax):    1.05 cm     PV Vmax:       1.00 m/s AV Area (Vmean):   1.12 cm     PV Vmean:      66.400 cm/s AV Area (VTI):     0.99 cm     PV VTI:        0.149 m AV Vmax:           202.00 cm/s  PV Peak grad:  4.0 mmHg AV Vmean:          128.333 cm/s PV Mean grad:  2.0 mmHg AV VTI:            0.364 m AV Peak Grad:      16.3 mmHg AV Mean Grad:      8.0 mmHg LVOT Vmax:         61.30 cm/s LVOT Vmean:        41.500 cm/s LVOT VTI:          0.104 m LVOT/AV VTI ratio: 0.29  AORTA Ao Root diam: 3.00 cm MITRAL VALVE                TRICUSPID VALVE MV Area (PHT): 3.03 cm     TR Peak grad:   48.7 mmHg MV Peak grad:  8.9 mmHg     TR Vmax:        349.00 cm/s MV Mean grad:  4.0 mmHg MV Vmax:       1.49 m/s     SHUNTS MV Vmean:      91.1 cm/s    Systemic VTI:  0.10 m MV Decel Time: 251 msec     Systemic Diam: 2.10 cm MV E velocity: 124.25 cm/s MV A velocity: 51.40 cm/s MV E/A ratio:  2.42 Bartholome Bill MD Electronically signed by Bartholome Bill MD Signature Date/Time: 08/10/2019/12:36:32 PM    Final      Medications   Scheduled Meds: . aspirin EC  81 mg Oral Daily  . atorvastatin  20 mg Oral QHS  . cholecalciferol  2,000 Units Oral Daily  . docusate sodium  100 mg Oral BID  . feeding supplement (ENSURE ENLIVE)  237 mL Oral Q24H  . furosemide  40 mg Intravenous BID  . metoprolol succinate  37.5 mg Oral Daily  . mometasone-formoterol  2 puff Inhalation BID  . multivitamin with minerals  1 tablet Oral Daily  . nystatin cream   Topical BID  . pantoprazole  40 mg Oral Daily  . polyethylene glycol  17 g Oral Daily  . potassium chloride  10 mEq Oral Daily  . sodium chloride flush  3 mL Intravenous Q12H  . vitamin B-12  1,000 mcg  Oral Daily  . warfarin  2.5 mg Oral Daily  . Warfarin - Physician Dosing Inpatient   Does not apply q1600   Continuous Infusions: . sodium chloride         LOS: 3 days    Time spent: 45 minutes with > 50% spent  in direct patient contact and coordination of care.    Ezekiel Slocumb, DO [facility]  08/12/2019, 9:02 AM    If 7PM-7AM, please contact night-coverage. How to contact the [facility] Attending or Consulting provider [place] or covering provider during after hours [place], for this patient?    1. Check the care team in [facility] and look for a) attending/consulting [facility] provider listed and b) the [facility] team listed 2. Log into [email or web address] and use [place]'s universal password to access. If you do not have the password, please contact the hospital operator. 3. Locate the [facility] provider you are looking for under [facility] and page to a number that you can be directly reached. 4. If you still have difficulty reaching the provider, please page the [facility] (Director on Call) for the Hospitalists listed on amion for assistance.

## 2019-08-12 NOTE — Progress Notes (Signed)
Pts HR elevated into the 150's-160's afib. MD and cardiologist notified. Gave 1 time dose of IV metoprolol. Pt still in afib 110's-120's. Pt resting comfortably in bed, denies chest pain.

## 2019-08-12 NOTE — Evaluation (Signed)
Clinical/Bedside Swallow Evaluation Patient Details  Name: Sheryl Hughes MRN: [id number] Date of Birth: May 25, 1931  Today's Date: 08/12/2019 Time: SLP Start Time (ACUTE ONLY): 1125 SLP Stop Time (ACUTE ONLY): 1215 SLP Time Calculation (min) (ACUTE ONLY): 50 min  Past Medical History:  Past Medical History:  Diagnosis Date  . Atrial fibrillation ([place])   . CAD (coronary artery disease)   . CHF (congestive heart failure) ([place])   . CKD (chronic kidney disease)   . GERD (gastroesophageal reflux disease)   . Hypertension   . Pulmonary hypertension ([place])   . Shingles    Past Surgical History:  Past Surgical History:  Procedure Laterality Date  . BACK SURGERY    . CARDIAC CATHETERIZATION    . COLONOSCOPY WITH PROPOFOL N/A 12/08/2014   Procedure: COLONOSCOPY WITH PROPOFOL;  Surgeon: Lucilla Lame, MD;  Location: [facility] ENDOSCOPY;  Service: Endoscopy;  Laterality: N/A;  look into colon with scope  . CORONARY ARTERY BYPASS GRAFT    . ESOPHAGOGASTRODUODENOSCOPY (EGD) WITH PROPOFOL N/A 12/08/2014   Procedure: ESOPHAGOGASTRODUODENOSCOPY (EGD) WITH PROPOFOL;  Surgeon: Lucilla Lame, MD;  Location: [facility] ENDOSCOPY;  Service: Endoscopy;  Laterality: N/A;  Look into stomach with scope.  Marland Kitchen EYE SURGERY    . MASTECTOMY    . MITRAL VALVE REPAIR    . PACEMAKER PLACEMENT     defibrillator  . THORACENTESIS Bilateral    HPI:  Pt is a 86 y.o. female with medical history significant for paroxysmal atrial fibrillation, chronic systolic dysfunction CHF with last known LVEF of 20%, coronary artery disease, stage IV chronic kidney disease, essential hypertension, COPD and pulmonary hypertension who presents to the emergency room for evaluation of shortness of breath.  Patient states that her symptoms have been going on for about a month and she has had her diuretic dose increased without any significant improvement in her symptoms.  She is concerned that she may need oxygen.  Shortness of breath is associated with lower  extremity swelling but she denies having cough, chest pain, fever or chills.  Chest x-ray shows chronic emphysematous changes and pulmonary scarring but no definite acute overlying pulmonary process. Small effusions.  Pt endorsed Esophageal dysmotility Baseline w/ Regurgitation occurrences. This occurred this AM when attempting to swallow Pills w/ NSG.  Noted BMI, weight.  Per chart notes, Palliative Care is following. daughter stated patient has had some loss of cognitive function - easily confused.  Patient ambulates with walker but is weak and spends most of her time sleeping.  She has had a ten pound weight loss in the past year, BMI is 14.  She has difficulty completing ADLs independently.  She has a poor appetite - recently has experienced trouble swallowing but this has been "assessed" w/ no further report/details.  Per Palliative notes, the patient just wants to be comfortable and daughter supports this goal.  Hospice services discussed for discharge.    Assessment / Plan / Recommendation Clinical Impression  Pt appears to present w/ adequate oropharyngeal phase swallow w/ No oropharyngeal phase dysphagia noted, No neuromuscular deficits noted.Pt consumed po trials w/ No overt, clinical s/s of aspiration during po trials. Pt appears at reduced risk for aspiration following general aspiration precautions. However, pt does endorse Baseline Esophageal Dysmotility and discomfort w/ Regurgitation occurrences. This can happen w/ Pills or foods consumed "too fast", she stated. NSG noted such this morning. During po trials at BSE taken slowly, pt consumed all consistencies w/ no overt coughing, decline in vocal quality, or change in respiratory  presentation during/post trials. Oral phase appeared [facility] w/ timely bolus management, mastication, and control of bolus propulsion for A-P transfer for swallowing. Oral clearing achieved w/ all trial consistencies. OM Exam appeared [facility] w/ no unilateral weakness noted.  Speech Clear. Pt fed self w/ setup support. Recommend a more Mech Soft consistency diet w/ well-Cut or Minced meats, moistened foods; Thin liquids. Recommend general aspiration and Reflux precautions, Pills WHOLE in Puree for safer, easier swallowing as pt described Larger pills causing difficulty to swallow; and Esophageal dysmotility overall. Education given on Pills in Puree; food consistencies and easy to eat options; general aspiration and Reflux precautions. NSG to reconsult if any new needs arise. NSG/pt agreed.  SLP Visit Diagnosis: Dysphagia, pharyngoesophageal phase (R13.14)(Esophageal phase dysmotility)    Aspiration Risk  Mild aspiration risk;Risk for inadequate nutrition/hydration(reduced following REFLUX/GERD precautions)    Diet Recommendation  Mech Soft w/ well-cut meats, moistened foods; Thin liquids, by Cup to lessen air swallowed. General aspiration and REFLUX precautions as pt endorses Esophageal Dysmotility w/ oral intake.   Medication Administration: Whole meds with puree(for safer swallowing; Crushed as needed for further ease)    Other  Recommendations Recommended Consults: Consider GI evaluation(Dietician f/u; Palliative Care following) Oral Care Recommendations: Oral care BID;Oral care before and after PO;Staff/trained caregiver to provide oral care Other Recommendations: (n/a)   Follow up Recommendations None      Frequency and Duration (n/a)  (n/a)       Prognosis Prognosis for Safe Diet Advancement: Fair(-Good) Barriers to Reach Goals: Cognitive deficits;Time post onset;Severity of deficits(Baseline Esophageal dysmotility)      Swallow Study   General Date of Onset: 08/09/19 HPI: Pt is a 86 y.o. female with medical history significant for paroxysmal atrial fibrillation, chronic systolic dysfunction CHF with last known LVEF of 20%, coronary artery disease, stage IV chronic kidney disease, essential hypertension, COPD and pulmonary hypertension who presents to  the emergency room for evaluation of shortness of breath.  Patient states that her symptoms have been going on for about a month and she has had her diuretic dose increased without any significant improvement in her symptoms.  She is concerned that she may need oxygen.  Shortness of breath is associated with lower extremity swelling but she denies having cough, chest pain, fever or chills.  Chest x-ray shows chronic emphysematous changes and pulmonary scarring but no definite acute overlying pulmonary process. Small effusions.  Pt endorsed Esophageal dysmotility Baseline w/ Regurgitation occurrences. This occurred this AM when attempting to swallow Pills w/ NSG.  Noted BMI, weight.  Per chart notes, Palliative Care is following. daughter stated patient has had some loss of cognitive function - easily confused.  Patient ambulates with walker but is weak and spends most of her time sleeping.  She has had a ten pound weight loss in the past year, BMI is 14.  She has difficulty completing ADLs independently.  She has a poor appetite - recently has experienced trouble swallowing but this has been "assessed" w/ no further report/details.  Per Palliative notes, the patient just wants to be comfortable and daughter supports this goal.  Hospice services discussed for discharge.  Type of Study: Bedside Swallow Evaluation Previous Swallow Assessment: none Diet Prior to this Study: Regular;Thin liquids Temperature Spikes Noted: No(wbc 5.9) Respiratory Status: Nasal cannula(1-2 L) History of Recent Intubation: No Behavior/Cognition: Alert;Cooperative;Pleasant mood(seemed nervous; anxious) Oral Cavity Assessment: Within Functional Limits Oral Care Completed by SLP: Yes Oral Cavity - Dentition: Adequate natural dentition Vision: Functional for self-feeding Self-Feeding  Abilities: Able to feed self;Needs set up Patient Positioning: Upright in bed(needed support) Baseline Vocal Quality: Normal Volitional Cough:  Strong Volitional Swallow: Able to elicit    Oral/Motor/Sensory Function Overall Oral Motor/Sensory Function: Within functional limits   Ice Chips Ice chips: Within functional limits Presentation: Spoon(fed; 2 trials)   Thin Liquid Thin Liquid: Within functional limits Presentation: Cup;Self Fed(8 trials) Other Comments: did not want anymore    Nectar Thick Nectar Thick Liquid: Not tested   Honey Thick Honey Thick Liquid: Not tested   Puree Puree: Within functional limits Presentation: Self Fed;Spoon(supported; 6 trials)   Solid     Solid: Within functional limits(grossly wfl) Presentation: Self Fed(4 trials of graham crackers dipped in applesauce)       Orinda Kenner, MS, CCC-SLP Sanii Kukla 08/12/2019,2:22 PM

## 2019-08-12 NOTE — Progress Notes (Signed)
Patient Name: Sheryl Hughes Date of Encounter: 08/12/2019  Hospital Problem List     Principal Problem:   Acute on chronic systolic CHF (congestive heart failure) ([place]) Active Problems:   CAD (coronary artery disease)   Chronic kidney disease (CKD), stage IV (severe) (H[facility])   Atrial fibrillation (H[facility])   Pulmonary hypertension (H[facility])   COPD (chronic obstructive pulmonary disease) (H[facility])   Protein-calorie malnutrition, severe   Goals of care, counseling/discussion   Palliative care by specialist    Patient Profile     86 year old female with history dilated cardiomyopathy with a known ejection fraction of 20 to 25% confirmed by echo during this admission, history of AICD, history of atrial fibrillation treated with rate control and anticoagulation admitted with increasing shortness of breath weakness and failure to thrive.  Had mild elevated troponin however this appears to be secondary to demand and not an acute coronary event.  Subjective   Legs less painful and less swollen today.  Had difficulty sleeping last night.  Inpatient Medications    . aspirin EC  81 mg Oral Daily  . atorvastatin  20 mg Oral QHS  . cholecalciferol  2,000 Units Oral Daily  . docusate sodium  100 mg Oral BID  . feeding supplement (ENSURE ENLIVE)  237 mL Oral Q24H  . furosemide  40 mg Intravenous BID  . metoprolol succinate  37.5 mg Oral Daily  . mometasone-formoterol  2 puff Inhalation BID  . multivitamin with minerals  1 tablet Oral Daily  . nystatin cream   Topical BID  . pantoprazole  40 mg Oral Daily  . polyethylene glycol  17 g Oral Daily  . potassium chloride  10 mEq Oral Daily  . sodium chloride flush  3 mL Intravenous Q12H  . vitamin B-12  1,000 mcg Oral Daily  . warfarin  2.5 mg Oral Daily  . Warfarin - Physician Dosing Inpatient   Does not apply q1600    Vital Signs    Vitals:   08/12/19 0200 08/12/19 0400 08/12/19 0629 08/12/19 0827  BP:   (!) 97/55 96/71  Pulse:   (!) 112 99   Resp: 17 20 20 17   Temp:   97.9 F (36.6 C) 97.9 F (36.6 C)  TempSrc:    Oral  SpO2:   94% 91%  Weight:   42.6 kg   Height:        Intake/Output Summary (Last 24 hours) at 08/12/2019 0844 Last data filed at 08/12/2019 0636 Gross per 24 hour  Intake --  Output 1850 ml  Net -1850 ml   Filed Weights   08/10/19 0800 08/10/19 1258 08/12/19 0629  Weight: 39.9 kg 38.1 kg 42.6 kg    Physical Exam    GEN: Well nourished, well developed, in no acute distress this morning.  HEENT: normal.  Neck: Supple, no JVD, carotid bruits, or masses. Cardiac: Irregular irregular beat.  Mild 1+ edema in lower extremities bilaterally. Respiratory:  Respirations regular and unlabored, clear to auscultation bilaterally. GI: Soft, nontender, nondistended, BS + x 4. MS: no deformity or atrophy. Skin: warm and dry, erythema on lower extremities. Neuro:  Strength and sensation are intact. Psych: Normal affect.  Labs    CBC Recent Labs    08/09/19 1548 08/11/19 0558  WBC 6.1 5.9  NEUTROABS 4.5  --   HGB 11.8* 10.4*  HCT 36.8 32.4*  MCV 91.5 91.0  PLT 185 885*   Basic Metabolic Panel Recent Labs    08/11/19 0558 08/12/19  7793  NA 139 138  K 4.5 4.3  CL 93* 92*  CO2 37* 35*  GLUCOSE 86 98  BUN 29* 26*  CREATININE 1.22* 1.20*  CALCIUM 8.6* 9.1  MG 2.1 2.1   Liver Function Tests Recent Labs    08/09/19 1548  AST 34  ALT 16  ALKPHOS 90  BILITOT 0.7  PROT 7.0  ALBUMIN 3.7   No results for input(s): LIPASE, AMYLASE in the last 72 hours. Cardiac Enzymes No results for input(s): CKTOTAL, CKMB, CKMBINDEX, TROPONINI in the last 72 hours. BNP Recent Labs    08/09/19 1548  BNP 3,071.2*   D-Dimer No results for input(s): DDIMER in the last 72 hours. Hemoglobin A1C No results for input(s): HGBA1C in the last 72 hours. Fasting Lipid Panel No results for input(s): CHOL, HDL, LDLCALC, TRIG, CHOLHDL, LDLDIRECT in the last 72 hours. Thyroid Function Tests No results for input(s):  TSH, T4TOTAL, T3FREE, THYROIDAB in the last 72 hours.  Invalid input(s): FREET3  Telemetry    Atrial fibrillation with variable ventricular response with intermittent RVR.  ECG    Atrial fibrillation with RVR.  No ischemia.  Incomplete left bundle branch block.  Radiology    DG Chest 2 View  Result Date: 08/09/2019 CLINICAL DATA:  Cough.  Chronic shortness of breath. EXAM: CHEST - 2 VIEW COMPARISON:  Chest x-ray 07/31/2015 and chest CT 11/04/2018 FINDINGS: The heart is mildly enlarged but stable. Stable tortuosity and calcification of the thoracic aorta. Stable pacer wires. Stable surgical changes from bypass surgery. Stable emphysematous changes and pulmonary scarring. No definite acute overlying pulmonary process. No pneumothorax. Suspect small effusions. IMPRESSION: Chronic emphysematous changes and pulmonary scarring but no definite acute overlying pulmonary process. Small effusions. Electronically Signed   By: Marijo Sanes M.D.   On: 08/09/2019 16:36   US Venous Img Lower Unilateral Right (DVT)  Result Date: 08/10/2019 CLINICAL DATA:  Right lower extremity pain and swelling increased over the past several weeks EXAM: RIGHT LOWER EXTREMITY VENOUS DOPPLER ULTRASOUND TECHNIQUE: Gray-scale sonography with compression, as well as color and duplex ultrasound, were performed to evaluate the deep venous system(s) from the level of the common femoral vein through the popliteal and proximal calf veins. COMPARISON:  None. FINDINGS: VENOUS Normal compressibility of the common femoral, superficial femoral, and popliteal veins, as well as the visualized calf veins. Visualized portions of profunda femoral vein and great saphenous vein unremarkable. No filling defects to suggest DVT on grayscale or color Doppler imaging. Doppler waveforms show normal direction of venous flow, normal respiratory plasticity and response to augmentation. Limited views of the contralateral common femoral vein are unremarkable.  OTHER None. Limitations: none IMPRESSION: No evidence of deep venous thrombosis in the right leg. Electronically Signed   By: Inez Catalina M.D.   On: 08/10/2019 01:58   ECHOCARDIOGRAM COMPLETE  Result Date: 08/10/2019    ECHOCARDIOGRAM REPORT   Patient Name:   Sheryl HULTS Date of Exam: 08/10/2019 Medical Rec #:  [id number]     Height:       66.0 in Accession #:    [id number]    Weight:       88.0 lb Date of Birth:  Aug 22, 1931     BSA:          1.411 m Patient Age:    21 years      BP:           94/52 mmHg Patient Gender: F  HR:           77 bpm. Exam Location:  [facility] Procedure: 2D Echo, Color Doppler and Cardiac Doppler Indications:     I50.9 Congestive Heart Failure  History:         Patient has no prior history of Echocardiogram examinations.                  CHF, CAD, CKD; Risk Factors:Hypertension.  Sonographer:     Charmayne Sheer RDCS (AE) Referring Phys:  [id number] Collier Bullock Diagnosing Phys: Bartholome Bill MD  Sonographer Comments: Suboptimal subcostal window. IMPRESSIONS  1. Left ventricular ejection fraction, by estimation, is 25 to 30%. The left ventricle has severely decreased function. The left ventricle demonstrates global hypokinesis. The left ventricular internal cavity size was mildly dilated. Left ventricular diastolic parameters were normal.  2. Right ventricular systolic function is mildly reduced. The right ventricular size is moderately enlarged. There is moderately elevated pulmonary artery systolic pressure.  3. Left atrial size was moderately dilated.  4. The mitral valve is degenerative. Trivial mitral valve regurgitation.  5. Tricuspid valve regurgitation is mild to moderate.  6. The aortic valve is grossly normal. Aortic valve regurgitation is not visualized. FINDINGS  Left Ventricle: Left ventricular ejection fraction, by estimation, is 25 to 30%. The left ventricle has severely decreased function. The left ventricle demonstrates global hypokinesis. The left ventricular internal  cavity size was mildly dilated. There is no left ventricular hypertrophy. Left ventricular diastolic parameters were normal. Right Ventricle: The right ventricular size is moderately enlarged. No increase in right ventricular wall thickness. Right ventricular systolic function is mildly reduced. There is moderately elevated pulmonary artery systolic pressure. The tricuspid regurgitant velocity is 3.49 m/s, and with an assumed right atrial pressure of 10 mmHg, the estimated right ventricular systolic pressure is 32.6 mmHg. Left Atrium: Left atrial size was moderately dilated. Right Atrium: Right atrial size was normal in size. Pericardium: There is no evidence of pericardial effusion. Mitral Valve: The mitral valve is degenerative in appearance. Trivial mitral valve regurgitation. MV peak gradient, 8.9 mmHg. The mean mitral valve gradient is 4.0 mmHg. Tricuspid Valve: The tricuspid valve is grossly normal. Tricuspid valve regurgitation is mild to moderate. Aortic Valve: The aortic valve is grossly normal. Aortic valve regurgitation is not visualized. Aortic valve mean gradient measures 8.0 mmHg. Aortic valve peak gradient measures 16.3 mmHg. Aortic valve area, by VTI measures 0.99 cm. Pulmonic Valve: The pulmonic valve was not well visualized. Pulmonic valve regurgitation is trivial. Aorta: The aortic root is normal in size and structure. IAS/Shunts: The interatrial septum was not assessed. Additional Comments: A pacer wire is visualized.  LEFT VENTRICLE PLAX 2D LVIDd:         5.18 cm  Diastology LVIDs:         4.61 cm  LV e' lateral:   5.33 cm/s LV PW:         0.88 cm  LV E/e' lateral: 23.3 LV IVS:        0.64 cm  LV e' medial:    3.70 cm/s LVOT diam:     2.10 cm  LV E/e' medial:  33.6 LV SV:         36 LV SV Index:   26 LVOT Area:     3.46 cm  RIGHT VENTRICLE RV Basal diam:  3.37 cm LEFT ATRIUM             Index       RIGHT ATRIUM  Index LA diam:        3.90 cm 2.76 cm/m  RA Area:     12.60 cm LA Vol  (A2C):   53.8 ml 38.14 ml/m RA Volume:   30.30 ml  21.48 ml/m LA Vol (A4C):   53.0 ml 37.57 ml/m LA Biplane Vol: 54.2 ml 38.43 ml/m  AORTIC VALVE                    PULMONIC VALVE AV Area (Vmax):    1.05 cm     PV Vmax:       1.00 m/s AV Area (Vmean):   1.12 cm     PV Vmean:      66.400 cm/s AV Area (VTI):     0.99 cm     PV VTI:        0.149 m AV Vmax:           202.00 cm/s  PV Peak grad:  4.0 mmHg AV Vmean:          128.333 cm/s PV Mean grad:  2.0 mmHg AV VTI:            0.364 m AV Peak Grad:      16.3 mmHg AV Mean Grad:      8.0 mmHg LVOT Vmax:         61.30 cm/s LVOT Vmean:        41.500 cm/s LVOT VTI:          0.104 m LVOT/AV VTI ratio: 0.29  AORTA Ao Root diam: 3.00 cm MITRAL VALVE                TRICUSPID VALVE MV Area (PHT): 3.03 cm     TR Peak grad:   48.7 mmHg MV Peak grad:  8.9 mmHg     TR Vmax:        349.00 cm/s MV Mean grad:  4.0 mmHg MV Vmax:       1.49 m/s     SHUNTS MV Vmean:      91.1 cm/s    Systemic VTI:  0.10 m MV Decel Time: 251 msec     Systemic Diam: 2.10 cm MV E velocity: 124.25 cm/s MV A velocity: 51.40 cm/s MV E/A ratio:  2.42 Bartholome Bill MD Electronically signed by Bartholome Bill MD Signature Date/Time: 08/10/2019/12:36:32 PM    Final     Assessment & Plan     1. LIMA to LAD 10/30/2011 2. Mitral valve repair, s/p Maze with dual-chamber ppm 10/30/2011 3. Ischemic cardiomyopathy 4. Chronic systolic congestive heart failure 5. Paroxysmal atrial fibrillation 6. Upgrade to dual-chamber ICD 07/05/2012 7. Essential hypertension 8. Hyperlipidemia 9 . Pulmonary hypertension 10. Bilateral pleural effusions, thoracentesis 10/22/2014, 10/24/2014 11. Cough secondary to lisinopril  1. Coronary artery disease-appears to have ruled out for myocardial infarction. Mildly elevated troponin appears to be secondary to demand as well as congestive heart failure. Continue with medical management to include aspirin 81 mg daily, will continue with metoprolol succinate at 37.5 mg daily and  follow heart rate, blood pressure for now given relatively soft blood pressure.  2. Cardiomyopathy-AICD in place. Device was recently interrogated and is functioning normally.  Agree with changing the metoprolol succinate as an alternative to tartrate given her cardiomyopathy.  Still somewhat tachycardic.  Not a candidate for Entresto secondary to ACE inhibitor.  Also relatively hypotensive.Low-sodium diet. Follow BNP which was 3071 on admission.  Will recheck today to see if there is any improvement.  Ejection fraction recently evaluated to be at 20 to 25%. This is been pretty stable for the last 3 or 4 years.  3. Atrial fibrillation-status post Maze procedure but has recurrent A. fib. Anticoagulated with warfarin.  INR was therapeutic at 2.2 yesterday.    Continue with current dose. We will continue with this with an INR goal between 2 and 3. Rate control with metoprolol.  4. Renal insufficiency-appears to be at her baseline. Will need to carefully follow electrolytes and renal function with diuresis.  Creatinine slightly improved to 1. 20 from 1. 30 on admission today.  Potassium is 4.3.  5. Hyperlipidemia-continue with atorvastatin 20 mg daily. Low-fat diet.  May be able to streamline medications and discontinue atorvastatin given her advanced age and relative anorexia however for now we will continue.  6.  Peripheral edema-evaluated with patient's daughter.  Has reasonable pulses in her posterior tibial zones.  Mild edema in her feet.  Slightly improved from admission  Signed, Javier Docker. Byron Peacock MD 08/12/2019, 8:44 AM  Pager: [phone]

## 2019-08-13 DIAGNOSIS — I4821 Permanent atrial fibrillation: Secondary | ICD-10-CM

## 2019-08-13 LAB — BASIC METABOLIC PANEL
Anion gap: 11 (ref 5–15)
BUN: 36 mg/dL — ABNORMAL HIGH (ref 8–23)
CO2: 38 mmol/L — ABNORMAL HIGH (ref 22–32)
Calcium: 9.7 mg/dL (ref 8.9–10.3)
Chloride: 88 mmol/L — ABNORMAL LOW (ref 98–111)
Creatinine, Ser: 1.42 mg/dL — ABNORMAL HIGH (ref 0.44–1.00)
GFR calc Af Amer: 39 mL/min — ABNORMAL LOW (ref >60)
GFR calc non Af Amer: 34 mL/min — ABNORMAL LOW (ref >60)
Glucose, Bld: 100 mg/dL — ABNORMAL HIGH (ref 70–99)
Potassium: 5.3 mmol/L — ABNORMAL HIGH (ref 3.5–5.1)
Sodium: 137 mmol/L (ref 135–145)

## 2019-08-13 LAB — CBC WITH DIFFERENTIAL/PLATELET
Abs Immature Granulocytes: 0.02 10*3/uL (ref 0.00–0.07)
Basophils Absolute: 0 10*3/uL (ref 0.0–0.1)
Basophils Relative: 0 %
Eosinophils Absolute: 0 10*3/uL (ref 0.0–0.5)
Eosinophils Relative: 0 %
HCT: 39 % (ref 36.0–46.0)
Hemoglobin: 12.3 g/dL (ref 12.0–15.0)
Immature Granulocytes: 0 %
Lymphocytes Relative: 8 %
Lymphs Abs: 0.6 10*3/uL — ABNORMAL LOW (ref 0.7–4.0)
MCH: 29.1 pg (ref 26.0–34.0)
MCHC: 31.5 g/dL (ref 30.0–36.0)
MCV: 92.2 fL (ref 80.0–100.0)
Monocytes Absolute: 1.1 10*3/uL — ABNORMAL HIGH (ref 0.1–1.0)
Monocytes Relative: 15 %
Neutro Abs: 5.9 10*3/uL (ref 1.7–7.7)
Neutrophils Relative %: 77 %
Platelets: 154 10*3/uL (ref 150–400)
RBC: 4.23 MIL/uL (ref 3.87–5.11)
RDW: 16.6 % — ABNORMAL HIGH (ref 11.5–15.5)
WBC: 7.7 10*3/uL (ref 4.0–10.5)
nRBC: 0 % (ref 0.0–0.2)

## 2019-08-13 LAB — PROTIME-INR
INR: 2 — ABNORMAL HIGH (ref 0.8–1.2)
Prothrombin Time: 22.3 seconds — ABNORMAL HIGH (ref 11.4–15.2)

## 2019-08-13 LAB — BRAIN NATRIURETIC PEPTIDE: B Natriuretic Peptide: 4302.4 pg/mL — ABNORMAL HIGH (ref 0.0–100.0)

## 2019-08-13 LAB — PROCALCITONIN: Procalcitonin: <0.1 ng/mL

## 2019-08-13 MED ORDER — FUROSEMIDE 10 MG/ML IJ SOLN
4.0000 mg/h | INTRAVENOUS | Status: DC
  Administered 2019-08-13: 4 mg/h via INTRAVENOUS
  Filled 2019-08-13: qty 25

## 2019-08-13 MED ORDER — DIGOXIN 0.25 MG/ML IJ SOLN
0.2500 mg | Freq: Once | INTRAMUSCULAR | Status: AC
  Administered 2019-08-13: 0.25 mg via INTRAVENOUS
  Filled 2019-08-13: qty 2

## 2019-08-13 NOTE — Progress Notes (Signed)
CCMD called and reported that pt just have 6 beats of non sustained vtach. Pt was asleep on assessment and was not on any distress. VSS. Notify NP Randol Kern and states to just monitor pt at this time. Will continue to monitor.

## 2019-08-13 NOTE — Progress Notes (Signed)
Patient Name: Sheryl Hughes Date of Encounter: 08/13/2019  Hospital Problem List     Principal Problem:   Acute on chronic systolic CHF (congestive heart failure) ([place]) Active Problems:   CAD (coronary artery disease)   Chronic kidney disease (CKD), stage IV (severe) (HCC)   Atrial fibrillation (HCC)   Pulmonary hypertension (HCC)   COPD (chronic obstructive pulmonary disease) (HCC)   Protein-calorie malnutrition, severe   Goals of care, counseling/discussion   Palliative care by specialist    Patient Profile       86 year old female with history dilated cardiomyopathy with a known ejection fraction of 20 to 25% confirmed by echo during this admission, history of AICD, history of atrial fibrillation treated with rate control and anticoagulation admitted with increasing shortness of breath weakness and failure to thrive. Had mild elevated troponin however this appears to be secondary to demand and not an acute coronary event.  Subjective   Resting comfortably with A. fib with RVR.  Inpatient Medications    . aspirin EC  81 mg Oral Daily  . atorvastatin  20 mg Oral QHS  . cholecalciferol  2,000 Units Oral Daily  . docusate sodium  100 mg Oral BID  . feeding supplement (ENSURE ENLIVE)  237 mL Oral Q24H  . furosemide  40 mg Intravenous BID  . melatonin  2.5 mg Oral QHS  . metoprolol succinate  37.5 mg Oral Daily  . mometasone-formoterol  2 puff Inhalation BID  . multivitamin with minerals  1 tablet Oral Daily  . nystatin cream   Topical BID  . pantoprazole  40 mg Oral Daily  . polyethylene glycol  17 g Oral Daily  . sodium chloride flush  3 mL Intravenous Q12H  . vitamin B-12  1,000 mcg Oral Daily  . warfarin  2.5 mg Oral Daily  . Warfarin - Physician Dosing Inpatient   Does not apply q1600    Vital Signs    Vitals:   08/13/19 0112 08/13/19 0438 08/13/19 0817 08/13/19 1132  BP: 106/69 98/71 111/75 (!) 89/70  Pulse: 77 95 (!) 116 78  Resp: 20 17 16 20   Temp: 98.9  F (37.2 C) 97.7 F (36.5 C) 98.4 F (36.9 C) 98 F (36.7 C)  TempSrc: Axillary  Oral   SpO2: 99%  92% 93%  Weight:  42.5 kg    Height:        Intake/Output Summary (Last 24 hours) at 08/13/2019 1435 Last data filed at 08/13/2019 1000 Gross per 24 hour  Intake 0 ml  Output 400 ml  Net -400 ml   Filed Weights   08/10/19 1258 08/12/19 0629 08/13/19 0438  Weight: 38.1 kg 42.6 kg 42.5 kg    Physical Exam    GEN: Well nourished, well developed, in no acute distress.  HEENT: normal.  Neck: Supple, no JVD, carotid bruits, or masses. Cardiac: Irregular irregular.  Respiratory:  Respirations regular and unlabored, clear to auscultation bilaterally. GI: Soft, nontender, nondistended, BS + x 4. MS: no deformity or atrophy. Skin: warm and dry, no rash. Neuro:  Strength and sensation are intact. Psych: Normal affect.  Labs    CBC Recent Labs    08/12/19 2032 08/13/19 0647  WBC 5.8 7.7  NEUTROABS 4.4 5.9  HGB 11.0* 12.3  HCT 35.4* 39.0  MCV 92.4 92.2  PLT 148* 564   Basic Metabolic Panel Recent Labs    08/11/19 0558 08/11/19 0558 08/12/19 0608 08/13/19 0647  NA 139   < > 138  137  K 4.5   < > 4.3 5.3*  CL 93*   < > 92* 88*  CO2 37*   < > 35* 38*  GLUCOSE 86   < > 98 100*  BUN 29*   < > 26* 36*  CREATININE 1.22*   < > 1.20* 1.42*  CALCIUM 8.6*   < > 9.1 9.7  MG 2.1  --  2.1  --    < > = values in this interval not displayed.   Liver Function Tests No results for input(s): AST, ALT, ALKPHOS, BILITOT, PROT, ALBUMIN in the last 72 hours. No results for input(s): LIPASE, AMYLASE in the last 72 hours. Cardiac Enzymes No results for input(s): CKTOTAL, CKMB, CKMBINDEX, TROPONINI in the last 72 hours. BNP Recent Labs    08/12/19 0608 08/13/19 0647  BNP 1,986.5* 4,302.4*   D-Dimer No results for input(s): DDIMER in the last 72 hours. Hemoglobin A1C No results for input(s): HGBA1C in the last 72 hours. Fasting Lipid Panel No results for input(s): CHOL, HDL,  LDLCALC, TRIG, CHOLHDL, LDLDIRECT in the last 72 hours. Thyroid Function Tests No results for input(s): TSH, T4TOTAL, T3FREE, THYROIDAB in the last 72 hours.  Invalid input(s): FREET3  Telemetry    A. fib with rapid ventricular response  ECG       Radiology    DG Chest 2 View  Result Date: 08/09/2019 CLINICAL DATA:  Cough.  Chronic shortness of breath. EXAM: CHEST - 2 VIEW COMPARISON:  Chest x-ray 07/31/2015 and chest CT 11/04/2018 FINDINGS: The heart is mildly enlarged but stable. Stable tortuosity and calcification of the thoracic aorta. Stable pacer wires. Stable surgical changes from bypass surgery. Stable emphysematous changes and pulmonary scarring. No definite acute overlying pulmonary process. No pneumothorax. Suspect small effusions. IMPRESSION: Chronic emphysematous changes and pulmonary scarring but no definite acute overlying pulmonary process. Small effusions. Electronically Signed   By: Marijo Sanes M.D.   On: 08/09/2019 16:36   US Venous Img Lower Unilateral Right (DVT)  Result Date: 08/10/2019 CLINICAL DATA:  Right lower extremity pain and swelling increased over the past several weeks EXAM: RIGHT LOWER EXTREMITY VENOUS DOPPLER ULTRASOUND TECHNIQUE: Gray-scale sonography with compression, as well as color and duplex ultrasound, were performed to evaluate the deep venous system(s) from the level of the common femoral vein through the popliteal and proximal calf veins. COMPARISON:  None. FINDINGS: VENOUS Normal compressibility of the common femoral, superficial femoral, and popliteal veins, as well as the visualized calf veins. Visualized portions of profunda femoral vein and great saphenous vein unremarkable. No filling defects to suggest DVT on grayscale or color Doppler imaging. Doppler waveforms show normal direction of venous flow, normal respiratory plasticity and response to augmentation. Limited views of the contralateral common femoral vein are unremarkable. OTHER None.  Limitations: none IMPRESSION: No evidence of deep venous thrombosis in the right leg. Electronically Signed   By: Inez Catalina M.D.   On: 08/10/2019 01:58   DG Chest Port 1 View  Result Date: 08/12/2019 CLINICAL DATA:  History of dilated cardiomyopathy. Shortness of breath. EXAM: PORTABLE CHEST 1 VIEW COMPARISON:  Chest radiograph 08/09/2019. FINDINGS: Multi lead AICD device overlies the right hemithorax. Stable cardiomegaly status post median sternotomy. Probable small bilateral pleural effusions. Patchy consolidative opacities within the lower lungs bilaterally. No pneumothorax. Thoracic spine degenerative changes. IMPRESSION: 1. Cardiomegaly. 2. Probable small bilateral pleural effusions. 3. Patchy bilateral airspace opacities within the lower lungs bilaterally may represent atelectasis or infection. Electronically Signed   By:  Lovey Newcomer M.D.   On: 08/12/2019 14:39   ECHOCARDIOGRAM COMPLETE  Result Date: 08/10/2019    ECHOCARDIOGRAM REPORT   Patient Name:   Sheryl Hughes Date of Exam: 08/10/2019 Medical Rec #:  [id number]     Height:       66.0 in Accession #:    [id number]    Weight:       88.0 lb Date of Birth:  [birth year]     BSA:          1.411 m Patient Age:    71 years      BP:           94/52 mmHg Patient Gender: F             HR:           77 bpm. Exam Location:  [facility] Procedure: 2D Echo, Color Doppler and Cardiac Doppler Indications:     I50.9 Congestive Heart Failure  History:         Patient has no prior history of Echocardiogram examinations.                  CHF, CAD, CKD; Risk Factors:Hypertension.  Sonographer:     Charmayne Sheer RDCS (AE) Referring Phys:  [id number] Collier Bullock Diagnosing Phys: Bartholome Bill MD  Sonographer Comments: Suboptimal subcostal window. IMPRESSIONS  1. Left ventricular ejection fraction, by estimation, is 25 to 30%. The left ventricle has severely decreased function. The left ventricle demonstrates global hypokinesis. The left ventricular internal cavity size was mildly  dilated. Left ventricular diastolic parameters were normal.  2. Right ventricular systolic function is mildly reduced. The right ventricular size is moderately enlarged. There is moderately elevated pulmonary artery systolic pressure.  3. Left atrial size was moderately dilated.  4. The mitral valve is degenerative. Trivial mitral valve regurgitation.  5. Tricuspid valve regurgitation is mild to moderate.  6. The aortic valve is grossly normal. Aortic valve regurgitation is not visualized. FINDINGS  Left Ventricle: Left ventricular ejection fraction, by estimation, is 25 to 30%. The left ventricle has severely decreased function. The left ventricle demonstrates global hypokinesis. The left ventricular internal cavity size was mildly dilated. There is no left ventricular hypertrophy. Left ventricular diastolic parameters were normal. Right Ventricle: The right ventricular size is moderately enlarged. No increase in right ventricular wall thickness. Right ventricular systolic function is mildly reduced. There is moderately elevated pulmonary artery systolic pressure. The tricuspid regurgitant velocity is 3.49 m/s, and with an assumed right atrial pressure of 10 mmHg, the estimated right ventricular systolic pressure is 73.2 mmHg. Left Atrium: Left atrial size was moderately dilated. Right Atrium: Right atrial size was normal in size. Pericardium: There is no evidence of pericardial effusion. Mitral Valve: The mitral valve is degenerative in appearance. Trivial mitral valve regurgitation. MV peak gradient, 8.9 mmHg. The mean mitral valve gradient is 4.0 mmHg. Tricuspid Valve: The tricuspid valve is grossly normal. Tricuspid valve regurgitation is mild to moderate. Aortic Valve: The aortic valve is grossly normal. Aortic valve regurgitation is not visualized. Aortic valve mean gradient measures 8.0 mmHg. Aortic valve peak gradient measures 16.3 mmHg. Aortic valve area, by VTI measures 0.99 cm. Pulmonic Valve: The  pulmonic valve was not well visualized. Pulmonic valve regurgitation is trivial. Aorta: The aortic root is normal in size and structure. IAS/Shunts: The interatrial septum was not assessed. Additional Comments: A pacer wire is visualized.  LEFT VENTRICLE PLAX 2D LVIDd:  5.18 cm  Diastology LVIDs:         4.61 cm  LV e' lateral:   5.33 cm/s LV PW:         0.88 cm  LV E/e' lateral: 23.3 LV IVS:        0.64 cm  LV e' medial:    3.70 cm/s LVOT diam:     2.10 cm  LV E/e' medial:  33.6 LV SV:         36 LV SV Index:   26 LVOT Area:     3.46 cm  RIGHT VENTRICLE RV Basal diam:  3.37 cm LEFT ATRIUM             Index       RIGHT ATRIUM           Index LA diam:        3.90 cm 2.76 cm/m  RA Area:     12.60 cm LA Vol (A2C):   53.8 ml 38.14 ml/m RA Volume:   30.30 ml  21.48 ml/m LA Vol (A4C):   53.0 ml 37.57 ml/m LA Biplane Vol: 54.2 ml 38.43 ml/m  AORTIC VALVE                    PULMONIC VALVE AV Area (Vmax):    1.05 cm     PV Vmax:       1.00 m/s AV Area (Vmean):   1.12 cm     PV Vmean:      66.400 cm/s AV Area (VTI):     0.99 cm     PV VTI:        0.149 m AV Vmax:           202.00 cm/s  PV Peak grad:  4.0 mmHg AV Vmean:          128.333 cm/s PV Mean grad:  2.0 mmHg AV VTI:            0.364 m AV Peak Grad:      16.3 mmHg AV Mean Grad:      8.0 mmHg LVOT Vmax:         61.30 cm/s LVOT Vmean:        41.500 cm/s LVOT VTI:          0.104 m LVOT/AV VTI ratio: 0.29  AORTA Ao Root diam: 3.00 cm MITRAL VALVE                TRICUSPID VALVE MV Area (PHT): 3.03 cm     TR Peak grad:   48.7 mmHg MV Peak grad:  8.9 mmHg     TR Vmax:        349.00 cm/s MV Mean grad:  4.0 mmHg MV Vmax:       1.49 m/s     SHUNTS MV Vmean:      91.1 cm/s    Systemic VTI:  0.10 m MV Decel Time: 251 msec     Systemic Diam: 2.10 cm MV E velocity: 124.25 cm/s MV A velocity: 51.40 cm/s MV E/A ratio:  2.42 Bartholome Bill MD Electronically signed by Bartholome Bill MD Signature Date/Time: 08/10/2019/12:36:32 PM    Final     Assessment & Plan     1. LIMA  to LAD 10/30/2011 2. Mitral valve repair, s/p Maze with dual-chamber ppm 10/30/2011 3. Ischemic cardiomyopathy 4. Chronic systolic congestive heart failure 5. Paroxysmal atrial fibrillation 6. Upgrade to dual-chamber ICD 07/05/2012 7. Essential hypertension 8. Hyperlipidemia  9 . Pulmonary hypertension 10. Bilateral pleural effusions, thoracentesis 10/22/2014, 10/24/2014 11. Cough secondary to lisinopril  1. Coronary artery disease-appears to have ruled out for myocardial infarction. Mildly elevated troponin appears to be secondary to demand as well as congestive heart failure. Continue with medical management to include aspirin 81 mg daily,will continue with metoprolol succinate at 37.5 mg daily and follow heart rate, blood pressure for now given relatively soft blood pressure.  2. Cardiomyopathy-AICD in place. Device was recently interrogated and is functioning normally.Agree with changing the metoprolol succinate as an alternative to tartrate given her cardiomyopathy. Still somewhat tachycardic. Not a candidate for Entresto secondary toACE inhibitor. Also relatively hypotensive.Low-sodium diet. Follow BNP which was 3071 on admission.    BNP is increased to 4302 from yesterday.  Had improved but now is going back up.   Ejection fraction recently evaluated to be at 20 to 25%. This is been pretty stable for the last 3 or 4 years.  3. Atrial fibrillation-status post Maze procedure but has recurrent A. fib. Anticoagulated with warfarin.  INR was therapeutic at 2.2 yesterday.    Continue with current dose. We will continue with this with an INR goal between 2 and 3. Rate control with metoprolol.  4. Renal insufficiency-renal function decreased today creatinine up to 1.42.  Potassium 5.3.  May need nephrology input.  Will try lasix drip at 4. Consider nephrology assistance.   5. Hyperlipidemia-continue with atorvastatin 20 mg daily. Low-fat diet.May be able to streamline  medications and discontinue atorvastatin given her advanced age and relative anorexia however for now we will continue.  6. Peripheral edema-evaluated with patient's daughter. Has reasonable pulses in her posterior tibial zones. Mild edema in her feet. Slightly improved from admission   Signed, Javier Docker. Jesi Jurgens MD 08/13/2019, 2:35 PM  Pager: [phone]

## 2019-08-13 NOTE — Plan of Care (Signed)
  Problem: Clinical Measurements: Goal: Ability to maintain clinical measurements within normal limits will improve Outcome: Progressing Goal: Respiratory complications will improve Outcome: Progressing   Problem: Coping: Goal: Level of anxiety will decrease Outcome: Progressing   Problem: Safety: Goal: Ability to remain free from injury will improve Outcome: Progressing   

## 2019-08-13 NOTE — Progress Notes (Addendum)
PROGRESS NOTE    NIKIESHA MILFORD   NTZ:[id number]  DOB: [birth year]  PCP: Kirk Ruths, MD    DOA: 08/09/2019 LOS: 4   Brief Narrative   Sheryl Hughes is a 86 y.o. female with medical history of paroxysmal atrial fibrillation, chronic systolic dysfunction CHF with last known LVEF of 20%, coronary artery disease, stage IV chronic kidney disease, essential hypertension, COPD and pulmonary hypertension who presented to the ED on 08/09/19 for evaluation of shortness of breath and lower extremity swelling.  She reported recent increased diuretic doses in outpatient setting, but has not noted improvement.  She reports recently that she is sleeping a lot and has no energy, wonders if she needs supplemental oxygen.  Chest xray showed chronic emphysematous changes and pulmonary scarring but no definite acute overlying pulmonary process. Small effusions.  EKG showed a ventricular paced rhythm.  Labs reveal mildly elevated troponin level as well as BNP level > 3000.  Admitted to hospitalist service with cardiology consulted, undergoing IV diuresis.    Palliative is consulted, goals of care discussions ongoing.  Initial plan is trial of short term rehab to see if patient will regain some strength vs going home with hospice.     Assessment & Plan   Principal Problem:   Acute on chronic systolic CHF (congestive heart failure) (HCC) Active Problems:   Atrial fibrillation (HCC)   Chronic kidney disease (CKD), stage IV (severe) (HCC)   Pulmonary hypertension (HCC)   COPD (chronic obstructive pulmonary disease) (HCC)   CAD (coronary artery disease)   Protein-calorie malnutrition, severe   Goals of care, counseling/discussion   Palliative care by specialist    Acute on chronic systolic CHF - POA with SOB/DOE and edema. Echo showed EF of 20-25% with global hypokinesis.  Unclear net fluid balance by charted I/O's.  BNP rechecked today significantly increased > 4000 probably due to afib  RVR. --Cardiology following --Continue IV diuresis --strict I/O's and daily weights --monitor renal function & electrolytes closely --Palliative care consulted, patient considering trial of rehab vs hospice.  She seems far too frail and weak to tolerate rehab, and not eating or drinking much.   A-fib with RVR - rates up to 150's-160's on 6/5.  Given IV metop, followed by digoxin, with improvement to the 100's to 110's.    Chronic Atrial fibrillation - Continue warfarin for anticoagulation, pharmacy consulted for dosing.  Daily INR's, therpeutic.  Continue Toprol XL as BP tolerates, increased slightly for better HR control.  Monitor BP and HR.  Acute Respiratory Failure with Hypoxia - secondary to CHF and A-fib RVR, probable.atelectasis.  Monitor for signs of pneumonia, so far no fevers or leukocytosis.  Procal with AM labs.  CXR.  Supplemental O2 to keep O2 sat > 90%.  Incentive spirometer.   Depression  Anorexia --trial of low dose mirtazapine last night, but patient more tired appearing today so will stop it  CKD stage IV (severe) - no uremic symptoms.  Monitor renal function closely with diuresis.  Avoid nephrotoxins and hypotension as much as possible.    Pulmonary hypertension - contributes to primary problem above.  Monitor.  Supplemental O2 as needed to keep O2 sat > 90%.  COPD - not acutely exacerbated, has no wheezing on exam, but does contribute to her symptoms.  Appears not to be on bronchodilators outpatient.  Dulera BID.  Duonebs PRN.    Coronary Artery Disease - stable, no active chest pain.  Continue ASA and statin.  GERD -  continue PPI  Esophageal dysmotility - likely due to long history of severe reflux.  Occasional regurgitation of food or medications.  Monitor closely.  Dysphagia - intermittent, with swallow pills.  SLP evaluated.  Mechanical soft diet recommended.  Meds in applesauce.  Foot cramping - Onset 6/3 afternoon.  Electrolytes were okay, unclear cause.   Improved with trial of Robaxin, ordered PRN.  Has since not complained of this.  DVT prophylaxis: on coumadin  Diet:  Diet Orders (From admission, onward)    Start     Ordered   08/12/19 1141  DIET DYS 3 Room service appropriate? Yes with Assist; Fluid consistency: Thin  Diet effective now    Comments: NO Straws. Extra Gravy on cut meats.  Likes an Omelette and yogurt at breakfast meals.  Question Answer Comment  Room service appropriate? Yes with Assist   Fluid consistency: Thin      08/12/19 1141            Code Status: DNR    Subjective 08/13/19    Patient seen at bedside during lunch today.  She was sleeping comfortably on rounds this morning.  Awake this afternoon but appears very tired, voice is even more soft than previous days.  She denies trouble breathing.    Met with daughter briefly this afternoon in passing.  She was taking patient's husband home as he was quite upset by her condition.  Daughter agreed with stopping mirtazapine.  Did state she is doubtful patient will tolerate rehab.      Disposition Plan & Communication   Status is: Inpatient  Remains inpatient appropriate because:IV treatments appropriate due to intensity of illness or inability to take PO   Dispo: The patient is from: Home              Anticipated d/c is to: SNF vs hospice              Anticipated d/c date is: 2 days              Patient currently is not medically stable to d/c.   Family Communication: daughter updated this afternoon  Consults, Procedures, Significant Events   Consultants:   Cardiology  Procedures:   2D Echo 08/09/19   Objective   Vitals:   08/13/19 1132 08/13/19 1524 08/13/19 1823 08/13/19 2100  BP: (!) 89/70 (!) 110/58 116/89 114/66  Pulse: 78 87 77   Resp: '20 14  20  ' Temp: 98 F (36.7 C) 97.9 F (36.6 C)  98.6 F (37 C)  TempSrc:    Oral  SpO2: 93% 100%  92%  Weight:      Height:        Intake/Output Summary (Last 24 hours) at 08/13/2019 2205 Last  data filed at 08/13/2019 2000 Gross per 24 hour  Intake 4.06 ml  Output 250 ml  Net -245.94 ml   Filed Weights   08/10/19 1258 08/12/19 0629 08/13/19 0438  Weight: 38.1 kg 42.6 kg 42.5 kg    Physical Exam:  General exam: awake, alert, frail, cachectic, appears fatigued, no acute distress Respiratory system: decreased breath sounds, shallow inspirations, normal respiratory effort Cardiovascular system: normal S1/S2, irregularly irregular, no pedal edema.   Central nervous system: A&O x3. no gross focal neurologic deficits, normal speech but very low volume Extremities: moves all, no edema, normal tone, stable patchy ecchymosis Skin: dry, intact, normal temperature, pale Psychiatry: depressed mood, congruent affect, judgement and insight appear normal  Labs   Data Reviewed: I  have personally reviewed following labs and imaging studies  CBC: Recent Labs  Lab 08/09/19 1548 08/11/19 0558 08/12/19 2032 08/13/19 0647  WBC 6.1 5.9 5.8 7.7  NEUTROABS 4.5  --  4.4 5.9  HGB 11.8* 10.4* 11.0* 12.3  HCT 36.8 32.4* 35.4* 39.0  MCV 91.5 91.0 92.4 92.2  PLT 185 135* 148* 409   Basic Metabolic Panel: Recent Labs  Lab 08/09/19 1548 08/10/19 0334 08/11/19 0558 08/12/19 0608 08/13/19 0647  NA 135 139 139 138 137  K 4.8 4.7 4.5 4.3 5.3*  CL 90* 95* 93* 92* 88*  CO2 37* 36* 37* 35* 38*  GLUCOSE 100* 97 86 98 100*  BUN 36* 32* 29* 26* 36*  CREATININE 1.30* 1.29* 1.22* 1.20* 1.42*  CALCIUM 9.0 8.6* 8.6* 9.1 9.7  MG  --  2.2 2.1 2.1  --    GFR: Estimated Creatinine Clearance: 19.4 mL/min (A) (by C-G formula based on SCr of 1.42 mg/dL (H)). Liver Function Tests: Recent Labs  Lab 08/09/19 1548  AST 34  ALT 16  ALKPHOS 90  BILITOT 0.7  PROT 7.0  ALBUMIN 3.7   No results for input(s): LIPASE, AMYLASE in the last 168 hours. No results for input(s): AMMONIA in the last 168 hours. Coagulation Profile: Recent Labs  Lab 08/09/19 2010 08/10/19 0334 08/11/19 0558 08/12/19 0608  08/13/19 0647  INR 2.3* 2.3* 2.2* 2.2* 2.0*   Cardiac Enzymes: No results for input(s): CKTOTAL, CKMB, CKMBINDEX, TROPONINI in the last 168 hours. BNP (last 3 results) No results for input(s): PROBNP in the last 8760 hours. HbA1C: No results for input(s): HGBA1C in the last 72 hours. CBG: No results for input(s): GLUCAP in the last 168 hours. Lipid Profile: No results for input(s): CHOL, HDL, LDLCALC, TRIG, CHOLHDL, LDLDIRECT in the last 72 hours. Thyroid Function Tests: No results for input(s): TSH, T4TOTAL, FREET4, T3FREE, THYROIDAB in the last 72 hours. Anemia Panel: No results for input(s): VITAMINB12, FOLATE, FERRITIN, TIBC, IRON, RETICCTPCT in the last 72 hours. Sepsis Labs: Recent Labs  Lab 08/13/19 0647  PROCALCITON <0.10    Recent Results (from the past 240 hour(s))  SARS Coronavirus 2 by RT PCR (hospital order, performed in [facility] hospital lab) Nasopharyngeal Nasopharyngeal Swab     Status: None   Collection Time: 08/09/19  8:10 PM   Specimen: Nasopharyngeal Swab  Result Value Ref Range Status   SARS Coronavirus 2 NEGATIVE NEGATIVE Final    Comment: (NOTE) SARS-CoV-2 target nucleic acids are NOT DETECTED. The SARS-CoV-2 RNA is generally detectable in upper and lower respiratory specimens during the acute phase of infection. The lowest concentration of SARS-CoV-2 viral copies this assay can detect is 250 copies / mL. A negative result does not preclude SARS-CoV-2 infection and should not be used as the sole basis for treatment or other patient management decisions.  A negative result may occur with improper specimen collection / handling, submission of specimen other than nasopharyngeal swab, presence of viral mutation(s) within the areas targeted by this assay, and inadequate number of viral copies (<250 copies / mL). A negative result must be combined with clinical observations, patient history, and epidemiological information. Fact Sheet for Patients:     StrictlyIdeas.no Fact Sheet for Healthcare Providers: BankingDealers.co.za This test is not yet approved or cleared  by the [place] FDA and has been authorized for detection and/or diagnosis of SARS-CoV-2 by FDA under an Emergency Use Authorization (EUA).  This EUA will remain in effect (meaning this test can be used) for  the duration of the COVID-19 declaration under Section 564(b)(1) of the Act, 21 U.S.C. section 360bbb-3(b)(1), unless the authorization is terminated or revoked sooner. Performed at [facility], [address], [place], [place] [address]       Imaging Studies   DG Chest [place] 1 View  Result Date: 08/12/2019 CLINICAL DATA:  History of dilated cardiomyopathy. Shortness of breath. EXAM: PORTABLE CHEST 1 VIEW COMPARISON:  Chest radiograph 08/09/2019. FINDINGS: Multi lead AICD device overlies the right hemithorax. Stable cardiomegaly status post median sternotomy. Probable small bilateral pleural effusions. Patchy consolidative opacities within the lower lungs bilaterally. No pneumothorax. Thoracic spine degenerative changes. IMPRESSION: 1. Cardiomegaly. 2. Probable small bilateral pleural effusions. 3. Patchy bilateral airspace opacities within the lower lungs bilaterally may represent atelectasis or infection. Electronically Signed   By: Lovey Newcomer M.D.   On: 08/12/2019 14:39     Medications   Scheduled Meds: . aspirin EC  81 mg Oral Daily  . atorvastatin  20 mg Oral QHS  . cholecalciferol  2,000 Units Oral Daily  . docusate sodium  100 mg Oral BID  . feeding supplement (ENSURE ENLIVE)  237 mL Oral Q24H  . melatonin  2.5 mg Oral QHS  . metoprolol succinate  37.5 mg Oral Daily  . mometasone-formoterol  2 puff Inhalation BID  . multivitamin with minerals  1 tablet Oral Daily  . nystatin cream   Topical BID  . pantoprazole  40 mg Oral Daily  . polyethylene glycol  17 g Oral Daily  . sodium chloride  flush  3 mL Intravenous Q12H  . vitamin B-12  1,000 mcg Oral Daily  . warfarin  2.5 mg Oral Daily  . Warfarin - Physician Dosing Inpatient   Does not apply q1600   Continuous Infusions: . sodium chloride 250 mL (08/13/19 1521)  . furosemide (LASIX) infusion 4 mg/hr (08/13/19 1520)       LOS: 4 days    Time spent: 30 minutes    Ezekiel Slocumb, DO [facility]  08/13/2019, 10:05 PM    If 7PM-7AM, please contact night-coverage. How to contact the [facility] Attending or Consulting provider [place] or covering provider during after hours [place], for this patient?    1. Check the care team in [facility] and look for a) attending/consulting TR[facility] provider listed and b) the [facility] team listed 2. Log into [email or web address] and use [place]'s universal password to access. If you do not have the password, please contact the hospital operator. 3. Locate the [facility] provider you are looking for under [facility] and page to a number that you can be directly reached. 4. If you still have difficulty reaching the provider, please page the [facility] (Director on Call) for the Hospitalists listed on amion for assistance.

## 2019-08-14 LAB — BASIC METABOLIC PANEL
Anion gap: 13 (ref 5–15)
BUN: 36 mg/dL — ABNORMAL HIGH (ref 8–23)
CO2: 38 mmol/L — ABNORMAL HIGH (ref 22–32)
Calcium: 9.2 mg/dL (ref 8.9–10.3)
Chloride: 87 mmol/L — ABNORMAL LOW (ref 98–111)
Creatinine, Ser: 1.25 mg/dL — ABNORMAL HIGH (ref 0.44–1.00)
GFR calc Af Amer: 45 mL/min — ABNORMAL LOW (ref >60)
GFR calc non Af Amer: 39 mL/min — ABNORMAL LOW (ref >60)
Glucose, Bld: 92 mg/dL (ref 70–99)
Potassium: 4.7 mmol/L (ref 3.5–5.1)
Sodium: 138 mmol/L (ref 135–145)

## 2019-08-14 LAB — PROTIME-INR
INR: 2.2 — ABNORMAL HIGH (ref 0.8–1.2)
Prothrombin Time: 23.7 seconds — ABNORMAL HIGH (ref 11.4–15.2)

## 2019-08-14 LAB — MAGNESIUM: Magnesium: 2.1 mg/dL (ref 1.7–2.4)

## 2019-08-14 MED ORDER — GLYCOPYRROLATE 0.2 MG/ML IJ SOLN
0.2000 mg | INTRAMUSCULAR | Status: DC | PRN
  Filled 2019-08-14: qty 1

## 2019-08-14 MED ORDER — HALOPERIDOL LACTATE 5 MG/ML IJ SOLN: 2.5000 mg | INTRAMUSCULAR | Status: DC | PRN

## 2019-08-14 MED ORDER — ACETAMINOPHEN 650 MG RE SUPP: 650.0000 mg | Freq: Four times a day (QID) | RECTAL | Status: DC | PRN

## 2019-08-14 MED ORDER — METOPROLOL TARTRATE 5 MG/5ML IV SOLN
5.0000 mg | Freq: Once | INTRAVENOUS | Status: AC
  Administered 2019-08-14: 5 mg via INTRAVENOUS
  Filled 2019-08-14: qty 5

## 2019-08-14 MED ORDER — GLYCOPYRROLATE 1 MG PO TABS
1.0000 mg | ORAL_TABLET | ORAL | Status: DC | PRN
  Filled 2019-08-14: qty 1

## 2019-08-14 MED ORDER — ACETAMINOPHEN 325 MG PO TABS: 650.0000 mg | ORAL_TABLET | Freq: Four times a day (QID) | ORAL | Status: DC | PRN

## 2019-08-14 MED ORDER — POLYVINYL ALCOHOL 1.4 % OP SOLN
1.0000 [drp] | Freq: Four times a day (QID) | OPHTHALMIC | Status: DC | PRN
  Filled 2019-08-14: qty 15

## 2019-08-14 MED ORDER — DEXTROSE 5 % IV SOLN: INTRAVENOUS | Status: DC

## 2019-08-14 MED ORDER — MORPHINE SULFATE (PF) 2 MG/ML IV SOLN: 1.0000 mg | INTRAVENOUS | Status: DC | PRN

## 2019-08-14 MED ORDER — DIGOXIN 0.25 MG/ML IJ SOLN
0.2500 mg | Freq: Once | INTRAMUSCULAR | Status: AC
  Administered 2019-08-14: 0.25 mg via INTRAVENOUS
  Filled 2019-08-14: qty 2

## 2019-08-14 MED ORDER — LORAZEPAM 2 MG/ML IJ SOLN: 2.0000 mg | INTRAMUSCULAR | Status: DC | PRN

## 2019-08-14 NOTE — Progress Notes (Addendum)
Rep from [employer] at bedside to turn off ICD for hospice care. Plan is for discharge to the hospice home tomorrow. Son and daughter at bedside. Will continue to monitor.

## 2019-08-14 NOTE — TOC Progression Note (Signed)
Transition of Care [facility]) - Progression Note    Patient Details  Name: Sheryl Hughes MRN: [id number] Date of Birth: [birth year]  Transition of Care [facility]) CM/SW Contact  Shelbie Ammons, RN Phone Number: 08/14/2019, 10:01 AM  Clinical Narrative:   RNCM met with patient's daughter [name] and Dr. Manuella Ghazi outside of room. Daughter relaying how much patient is declining and that it would be her wishes to be serviced by Hospice. Dr. Manuella Ghazi in agreement and verbalizing that patient is likely Hospice home appropriate. Per request of patient's daughter referral made to [facility], representative Santiago Glad notified.     Expected Discharge Plan: [place] Barriers to Discharge: Continued Medical Work up  Expected Discharge Plan and Services Expected Discharge Plan: [place] Choice: [place] arrangements for the past 2 months: Single Family Home                                       Social Determinants of Health (SDOH) Interventions    Readmission Risk Interventions Readmission Risk Prevention Plan 08/10/2019  Transportation Screening Complete  PCP or Specialist Appt within 3-5 Days Complete  HRI or [place] Complete  Social Work Consult for [place] Planning/Counseling Complete  Palliative Care Screening Not Applicable  Medication Review Press photographer) Complete  Some recent data might be hidden

## 2019-08-14 NOTE — Progress Notes (Signed)
Pt's HR sustaining at 140's afib with RVR, Doristine Mango, PA on the floor and notified, will go in to assess patient. Will continue to monitor.

## 2019-08-14 NOTE — Progress Notes (Signed)
OT Cancellation Note  Patient Details Name: Sheryl Hughes MRN: [id number] DOB: 02/01/1932   Cancelled Treatment:    Reason Eval/Treat Not Completed: Other (comment). Chart reviewed. Pt with sustained HR 140's this am. Per case mgt, pt now family requesting Hospice. MEWS noted to be comfort care only. Will sign off for OT. Please re-consult if plan of care should change and pt medically appropriate.   Jeni Salles, MPH, MS, OTR/L ascom [phone] 08/14/19, 11:44 AM

## 2019-08-14 NOTE — Progress Notes (Signed)
[place] visited pt. per OR for end of life care.  RN said pt.'s dtr. and son are @ bedside --> considering hospice for pt.  Ch entered rm. and met family @ bedside; pt. in bed drowsy but awake.  Shortly after [facility] arrived hospice RN arrived to discuss options w/family; family requested [place] pray w/pt. and [place] knelt and offered prayer for pt.'s sense of divine presence and peace before excusing himself to allow family and hospice RN to talk.  CH remains available as needed.

## 2019-08-14 NOTE — Consult Note (Signed)
Sheryl Hughes for Warfarin Indication: atrial fibrillation  Allergies  Allergen Reactions  . Biaxin [Clarithromycin] Other (See Comments)    Reaction:  Numbness of lips   . Flagyl [Metronidazole] Other (See Comments)    Reaction:  Numbness of lips   . Penicillins Rash and Other (See Comments)    Pt is unable to answer additional questions about this medication because it happened so long ago.  . Tetracyclines & Related Rash    Patient Measurements: Height: 5\' 4"  (162.6 cm) Weight: 41.9 kg (92 lb 6.4 oz) IBW/kg (Calculated) : 54.7  Vital Signs: Temp: 98 F (36.7 C) (06/07 0516) Temp Source: Oral (06/07 0516) BP: 106/80 (06/07 0805) Pulse Rate: 143 (06/07 0805)  Labs: Recent Labs    08/12/19 1027 08/12/19 2032 08/13/19 0647 08/14/19 0536  HGB  --  11.0* 12.3  --   HCT  --  35.4* 39.0  --   PLT  --  148* 154  --   LABPROT 23.4*  --  22.3* 23.7*  INR 2.2*  --  2.0* 2.2*  CREATININE 1.20*  --  1.42* 1.25*    Estimated Creatinine Clearance: 21.8 mL/min (A) (by C-G formula based on SCr of 1.25 mg/dL (H)).   Medical History: Past Medical History:  Diagnosis Date  . Atrial fibrillation ([place])   . CAD (coronary artery disease)   . CHF (congestive heart failure) ([place])   . CKD (chronic kidney disease)   . GERD (gastroesophageal reflux disease)   . Hypertension   . Pulmonary hypertension ([place])   . Shingles     Medications:  Medications Prior to Admission  Medication Sig Dispense Refill Last Dose  . aspirin EC 81 MG tablet Take 81 mg by mouth daily.   08/09/2019 at 0900  . atorvastatin (LIPITOR) 20 MG tablet Take 20 mg by mouth at bedtime.    08/08/2019 at 2000  . Calcium Carbonate-Vitamin D 600-400 MG-UNIT tablet Take by mouth.   08/09/2019 at 0900  . Cholecalciferol (VITAMIN D-1000 MAX ST) 25 MCG (1000 UT) tablet Take by mouth.   08/09/2019 at 0900  . docusate sodium (COLACE) 100 MG capsule Take 100 mg by mouth 2 (two) times daily.     08/09/2019 at 0900  . furosemide (LASIX) 40 MG tablet Take 40-80 mg by mouth daily as needed for fluid.    08/09/2019 at 0900  . hydrOXYzine (ATARAX/VISTARIL) 10 MG tablet Take by mouth.   Past Month at PRN  . losartan (COZAAR) 25 MG tablet Take 25 mg by mouth daily.   08/09/2019 at 0900  . metoprolol tartrate (LOPRESSOR) 100 MG tablet Take 100 mg by mouth 2 (two) times daily.   08/09/2019 at 0900  . pantoprazole (PROTONIX) 40 MG tablet TAKE 1 TABLET BY MOUTH TWICE DAILY   08/09/2019 at 0900  . polyethylene glycol (MIRALAX / GLYCOLAX) packet Take 17 g by mouth daily. 14 each 0 08/09/2019 at 0900  . potassium chloride (KLOR-CON) 10 MEQ tablet Take 10 mEq by mouth daily.   08/09/2019 at 0900  . vitamin B-12 (CYANOCOBALAMIN) 1000 MCG tablet Take 1,000 mcg by mouth daily.   08/09/2019 at 0900  . warfarin (COUMADIN) 5 MG tablet Take 2.5 mg by mouth. (1/2 tablet)   08/08/2019 at 1830   Scheduled:  . aspirin EC  81 mg Oral Daily  . atorvastatin  20 mg Oral QHS  . cholecalciferol  2,000 Units Oral Daily  . docusate sodium  100 mg Oral BID  .  feeding supplement (ENSURE ENLIVE)  237 mL Oral Q24H  . melatonin  2.5 mg Oral QHS  . metoprolol succinate  37.5 mg Oral Daily  . mometasone-formoterol  2 puff Inhalation BID  . multivitamin with minerals  1 tablet Oral Daily  . nystatin cream   Topical BID  . pantoprazole  40 mg Oral Daily  . polyethylene glycol  17 g Oral Daily  . sodium chloride flush  3 mL Intravenous Q12H  . vitamin B-12  1,000 mcg Oral Daily  . warfarin  2.5 mg Oral Daily   Infusions:  . sodium chloride 250 mL (08/13/19 1521)  . furosemide (LASIX) infusion 4 mg/hr (08/13/19 1520)   PRN: sodium chloride, acetaminophen, ipratropium-albuterol, methocarbamol, ondansetron (ZOFRAN) IV, polyethylene glycol, senna, sodium chloride flush Anti-infectives (From admission, onward)   None      Assessment: Pharmacy consulted to manage warfarin. Home dose: warfarin 2.5 daily. NO major DDI noted.   Date INR  Warfarin Dose  6/6 2 2.5 mg  6/7 2.2 2.5 mg    Goal of Therapy:  INR 2-3 Monitor platelets by anticoagulation protocol: Yes   Plan:  INR is therapeutic. Will continue home dose warfarin 2.5 mg daily. Daily INR ordered. CBC at least every 3 days.   Oswald Hillock, PharmD, BCPS 08/14/2019,8:56 AM

## 2019-08-14 NOTE — Progress Notes (Signed)
[facility] Cardiology    SUBJECTIVE: Sheryl Hughes is an 86 year old female with a past medical history significant for coronary artery disease s/p CABG with a LIMA to the LAD in 2013, mitral valve disease, paroxsymal atrial fibrillation s/p MAZE procedure, anticoagulated with warfarin, ischemic cardiomyopathy with dual chamber ICD in place, chronic HFrEF, pulmonary hypertension, essential hypertension, and hyperlipidemia who presented to the ED on 08/10/19 for a month long history of shortness of breath.  Ms. Selinger is somewhat of a poor historian, but admits to "being sleepy."  She admits to a heart racing sensation but denies chest pain or shortness of breath.   Echocardiogram during this admission revealed severely reduced LV systolic function with an EF estimated between 25-30% with global hypokinesis.    Vitals:   08/14/19 0359 08/14/19 0400 08/14/19 0516 08/14/19 0540  BP: 107/79  112/72 110/81  Pulse:   87 74  Resp: 18 19 14    Temp: 98.5 F (36.9 C)  98 F (36.7 C)   TempSrc: Oral  Oral   SpO2: 95%  100%   Weight:   41.9 kg   Height:         Intake/Output Summary (Last 24 hours) at 08/14/2019 4166 Last data filed at 08/14/2019 0630 Gross per 24 hour  Intake 28.72 ml  Output 850 ml  Net -821.28 ml      PHYSICAL EXAM  General: Well developed, well nourished, in no acute distress HEENT:  Normocephalic and atramatic Neck:  No JVD.  Lungs: Clear bilaterally to auscultation and percussion. Heart: Irregularly irregular, rapid ventricular response. Normal S1 and S2 without gallops or murmurs.  Abdomen: Bowel sounds are positive, abdomen soft and non-tender  Msk:  Back normal. Normal strength and tone for age. Extremities: No clubbing, cyanosis or edema.   Neuro: Alert and oriented X 3. Psych:  Good affect, responds appropriately   LABS: Basic Metabolic Panel: Recent Labs    08/12/19 0608 08/12/19 0608 08/13/19 0647 08/14/19 0536  NA 138   < > 137 138  K 4.3   < > 5.3* 4.7  CL  92*   < > 88* 87*  CO2 35*   < > 38* 38*  GLUCOSE 98   < > 100* 92  BUN 26*   < > 36* 36*  CREATININE 1.20*   < > 1.42* 1.25*  CALCIUM 9.1   < > 9.7 9.2  MG 2.1  --   --  2.1   < > = values in this interval not displayed.   Liver Function Tests: No results for input(s): AST, ALT, ALKPHOS, BILITOT, PROT, ALBUMIN in the last 72 hours. No results for input(s): LIPASE, AMYLASE in the last 72 hours. CBC: Recent Labs    08/12/19 2032 08/13/19 0647  WBC 5.8 7.7  NEUTROABS 4.4 5.9  HGB 11.0* 12.3  HCT 35.4* 39.0  MCV 92.4 92.2  PLT 148* 154   Cardiac Enzymes: No results for input(s): CKTOTAL, CKMB, CKMBINDEX, TROPONINI in the last 72 hours. BNP: Invalid input(s): POCBNP D-Dimer: No results for input(s): DDIMER in the last 72 hours. Hemoglobin A1C: No results for input(s): HGBA1C in the last 72 hours. Fasting Lipid Panel: No results for input(s): CHOL, HDL, LDLCALC, TRIG, CHOLHDL, LDLDIRECT in the last 72 hours. Thyroid Function Tests: No results for input(s): TSH, T4TOTAL, T3FREE, THYROIDAB in the last 72 hours.  Invalid input(s): FREET3 Anemia Panel: No results for input(s): VITAMINB12, FOLATE, FERRITIN, TIBC, IRON, RETICCTPCT in the last 72 hours.  DG  Chest Port 1 View  Result Date: 08/12/2019 CLINICAL DATA:  History of dilated cardiomyopathy. Shortness of breath. EXAM: PORTABLE CHEST 1 VIEW COMPARISON:  Chest radiograph 08/09/2019. FINDINGS: Multi lead AICD device overlies the right hemithorax. Stable cardiomegaly status post median sternotomy. Probable small bilateral pleural effusions. Patchy consolidative opacities within the lower lungs bilaterally. No pneumothorax. Thoracic spine degenerative changes. IMPRESSION: 1. Cardiomegaly. 2. Probable small bilateral pleural effusions. 3. Patchy bilateral airspace opacities within the lower lungs bilaterally may represent atelectasis or infection. Electronically Signed   By: Lovey Newcomer M.D.   On: 08/12/2019 14:39     Echo:  Severely reduced LV systolic function, EF 78-93% with global hypokinesis   TELEMETRY: Atrial fibrillation with rapid ventricular response; rate up to 140bpm   ASSESSMENT AND PLAN:  Principal Problem:   Acute on chronic systolic CHF (congestive heart failure) (HCC) Active Problems:   CAD (coronary artery disease)   Chronic kidney disease (CKD), stage IV (severe) (HCC)   Atrial fibrillation (HCC)   Pulmonary hypertension (HCC)   COPD (chronic obstructive pulmonary disease) (HCC)   Protein-calorie malnutrition, severe   Goals of care, counseling/discussion   Palliative care by specialist    1. Coronary artery disease  -Elevated troponin likely due to demand ischemia from CHF   -No further ischemic workup indicated at this point; will continue with include aspirin 81 mg daily, continue withmetoprolol succinateat37.5 mg daily and continue to monitor BP   2. Cardiomyopathy  -AICD in place; functioning normally per recent interrogation  -Per DNR protocol, will turn off ICD component of device  -Metoprolol tartrate switched to succinate  -Will defer further medication management due to hypotension   3. Atrial fibrillation  -S/p Maze procedure; anticoagulated with warfarin; INR therapeutic at 2.2   -Rate remains poorly controlled; will continue metoprolol succinate 37.5mg  and give one dose digoxin .25mg  due to soft BP   4. Renal insufficiency  -Creatinine improved from 1.42 to 1.25  5. Hyperlipidemia  -Will continue atorvastatin for now   The history, physical exam findings, and plan of care were all discussed with Dr. Bartholome Bill, and all decision making was made in collaboration.   Avie Arenas PA-C 08/14/2019 7:33 AM

## 2019-08-14 NOTE — Progress Notes (Signed)
[facility] Liaison Note:  New referral for [facility] hospice home received from [facility].  Writer met in the room with patient her daughter [name] and son Lanny Hurst to initiate education regarding hospice services, philosophy, team approach top care and current visitation policy with understanding voiced. Questions answered.  Patient information sent to referral. Patient has been approved for admission by hospice physician Dr. Jewel Baize.  Patient has an active AICD, family is in agreement with this being deactivated.  Hospital care team aware and will arrange for the AICD to be deactivated her at [facility].  Unfortunately [facility] is unable to offer a bed today, plan is for discharge to the hospice home tomorrow pending  consents and patient's stability. Family and hospital care team have been updated. Will continue to follow through discharge. Thank you for the opportunity to be involved in the care of this patient and her family. Flo Shanks BSN, RN, [place] [phone]

## 2019-08-14 NOTE — Progress Notes (Signed)
PROGRESS NOTE    Sheryl Hughes   DGU:[id number]  DOB: November 14, 1931  PCP: Kirk Ruths, MD    DOA: 08/09/2019 LOS: 5   Brief Narrative   Sheryl Hughes is a 86 y.o. female with medical history of paroxysmal atrial fibrillation, chronic systolic dysfunction CHF with last known LVEF of 20%, coronary artery disease, stage IV chronic kidney disease, essential hypertension, COPD and pulmonary hypertension who presented to the ED on 08/09/19 for evaluation of shortness of breath and lower extremity swelling.  She reported recent increased diuretic doses in outpatient setting, but has not noted improvement.  She reports recently that she is sleeping a lot and has no energy, wonders if she needs supplemental oxygen.  Chest xray showed chronic emphysematous changes and pulmonary scarring but no definite acute overlying pulmonary process. Small effusions.  EKG showed a ventricular paced rhythm.  Labs reveal mildly elevated troponin level as well as BNP level > 3000.  Admitted to hospitalist service with cardiology consulted, undergoing IV diuresis.    Palliative is consulted, goals of care discussions ongoing.  Initial plan is trial of short term rehab to see if patient will regain some strength vs going home with hospice.     Assessment & Plan   Principal Problem:   Acute on chronic systolic CHF (congestive heart failure) (HCC) Active Problems:   CAD (coronary artery disease)   Chronic kidney disease (CKD), stage IV (severe) (HCC)   Atrial fibrillation (HCC)   Pulmonary hypertension (HCC)   COPD (chronic obstructive pulmonary disease) (HCC)   Protein-calorie malnutrition, severe   Goals of care, counseling/discussion   Palliative care by specialist  Acute on chronic systolic CHF - POA with SOB/DOE and edema. Echo showed EF of 20-25% with global hypokinesis.   -Comfort care now   A-fib with RVR  Chronic Atrial fibrillation  Acute Respiratory Failure with Hypoxia - secondary to CHF and  A-fib RVR Depression  Anorexia CKD stage IV (severe) - no uremic symptoms Pulmonary hypertension  COPD  Coronary Artery Disease  GERD Esophageal dysmotility  Dysphagia - intermittent, with swallow pills.  SLP evaluated.  Mechanical soft diet recommended.  Meds in applesauce. Foot cramping   DVT prophylaxis: Comfort care only   diet:  Diet Orders (From admission, onward)    Start     Ordered   08/12/19 1141  DIET DYS 3 Room service appropriate? Yes with Assist; Fluid consistency: Thin  Diet effective now    Comments: NO Straws. Extra Gravy on cut meats.  Likes an Omelette and yogurt at breakfast meals.  Question Answer Comment  Room service appropriate? Yes with Assist   Fluid consistency: Thin      08/12/19 1141            Code Status: DNR    Subjective 08/14/19    Patient seen at bedside, appears very tired, would like to stay comfortable, agreeable with hospice.  Met with daughter and son at bedside Disposition Plan & Communication   Status is: Inpatient  Remains inpatient appropriate because:IV treatments appropriate due to intensity of illness or inability to take PO   Dispo: The patient is from: Home              Anticipated d/c is to: hospice home              Anticipated d/c date is: 1 day              Patient currently is medically stable to  d/c.  There are no hospice home beds available today.  Likely discharge tomorrow if bed is available at hospice home   Family Communication: daughter and son updated at bedside this morning  Consults, Procedures, Significant Events   Consultants:   Cardiology  Procedures:   2D Echo 08/09/19   Objective   Vitals:   08/14/19 0516 08/14/19 0540 08/14/19 0804 08/14/19 0805  BP: 112/72 110/81 (!) 91/59 106/80  Pulse: 87 74 (!) 112 (!) 143  Resp: 14  18   Temp: 98 F (36.7 C)     TempSrc: Oral     SpO2: 100%  98%   Weight: 41.9 kg     Height:        Intake/Output Summary (Last 24 hours) at 08/14/2019  1433 Last data filed at 08/14/2019 1014 Gross per 24 hour  Intake 42.17 ml  Output 850 ml  Net -807.83 ml   Filed Weights   08/12/19 0629 08/13/19 0438 08/14/19 0516  Weight: 42.6 kg 42.5 kg 41.9 kg    Physical Exam:  General exam: awake, alert, frail, cachectic, appears fatigued, no acute distress Respiratory system: decreased breath sounds, shallow inspirations, normal respiratory effort Cardiovascular system: normal S1/S2, irregularly irregular, no pedal edema.   Central nervous system: A&O x3. no gross focal neurologic deficits, normal speech but very low volume Extremities: moves all, no edema, normal tone, stable patchy ecchymosis Skin: dry, intact, normal temperature, pale Psychiatry: depressed mood, congruent affect, judgement and insight appear normal  Labs   Data Reviewed: I have personally reviewed following labs and imaging studies  CBC: Recent Labs  Lab 08/09/19 1548 08/11/19 0558 08/12/19 2032 08/13/19 0647  WBC 6.1 5.9 5.8 7.7  NEUTROABS 4.5  --  4.4 5.9  HGB 11.8* 10.4* 11.0* 12.3  HCT 36.8 32.4* 35.4* 39.0  MCV 91.5 91.0 92.4 92.2  PLT 185 135* 148* 330   Basic Metabolic Panel: Recent Labs  Lab 08/10/19 0334 08/11/19 0558 08/12/19 0608 08/13/19 0647 08/14/19 0536  NA 139 139 138 137 138  K 4.7 4.5 4.3 5.3* 4.7  CL 95* 93* 92* 88* 87*  CO2 36* 37* 35* 38* 38*  GLUCOSE 97 86 98 100* 92  BUN 32* 29* 26* 36* 36*  CREATININE 1.29* 1.22* 1.20* 1.42* 1.25*  CALCIUM 8.6* 8.6* 9.1 9.7 9.2  MG 2.2 2.1 2.1  --  2.1   GFR: Estimated Creatinine Clearance: 21.8 mL/min (A) (by C-G formula based on SCr of 1.25 mg/dL (H)). Liver Function Tests: Recent Labs  Lab 08/09/19 1548  AST 34  ALT 16  ALKPHOS 90  BILITOT 0.7  PROT 7.0  ALBUMIN 3.7   No results for input(s): LIPASE, AMYLASE in the last 168 hours. No results for input(s): AMMONIA in the last 168 hours. Coagulation Profile: Recent Labs  Lab 08/10/19 0334 08/11/19 0558 08/12/19 0608  08/13/19 0647 08/14/19 0536  INR 2.3* 2.2* 2.2* 2.0* 2.2*   Cardiac Enzymes: No results for input(s): CKTOTAL, CKMB, CKMBINDEX, TROPONINI in the last 168 hours. BNP (last 3 results) No results for input(s): PROBNP in the last 8760 hours. HbA1C: No results for input(s): HGBA1C in the last 72 hours. CBG: No results for input(s): GLUCAP in the last 168 hours. Lipid Profile: No results for input(s): CHOL, HDL, LDLCALC, TRIG, CHOLHDL, LDLDIRECT in the last 72 hours. Thyroid Function Tests: No results for input(s): TSH, T4TOTAL, FREET4, T3FREE, THYROIDAB in the last 72 hours. Anemia Panel: No results for input(s): VITAMINB12, FOLATE, FERRITIN, TIBC, IRON, RETICCTPCT in  the last 72 hours. Sepsis Labs: Recent Labs  Lab 08/13/19 0647  PROCALCITON <0.10    Recent Results (from the past 240 hour(s))  SARS Coronavirus 2 by RT PCR (hospital order, performed in [facility] hospital lab) Nasopharyngeal Nasopharyngeal Swab     Status: None   Collection Time: 08/09/19  8:10 PM   Specimen: Nasopharyngeal Swab  Result Value Ref Range Status   SARS Coronavirus 2 NEGATIVE NEGATIVE Final    Comment: (NOTE) SARS-CoV-2 target nucleic acids are NOT DETECTED. The SARS-CoV-2 RNA is generally detectable in upper and lower respiratory specimens during the acute phase of infection. The lowest concentration of SARS-CoV-2 viral copies this assay can detect is 250 copies / mL. A negative result does not preclude SARS-CoV-2 infection and should not be used as the sole basis for treatment or other patient management decisions.  A negative result may occur with improper specimen collection / handling, submission of specimen other than nasopharyngeal swab, presence of viral mutation(s) within the areas targeted by this assay, and inadequate number of viral copies (<250 copies / mL). A negative result must be combined with clinical observations, patient history, and epidemiological information. Fact Sheet for  Patients:   StrictlyIdeas.no Fact Sheet for Healthcare Providers: BankingDealers.co.za This test is not yet approved or cleared  by the [place] FDA and has been authorized for detection and/or diagnosis of SARS-CoV-2 by FDA under an Emergency Use Authorization (EUA).  This EUA will remain in effect (meaning this test can be used) for the duration of the COVID-19 declaration under Section 564(b)(1) of the Act, 21 U.S.C. section 360bbb-3(b)(1), unless the authorization is terminated or revoked sooner. Performed at [facility], [address] [place], [place] [address]       Imaging Studies   No results found.   Medications   Scheduled Meds: . nystatin cream   Topical BID  . polyethylene glycol  17 g Oral Daily  . sodium chloride flush  3 mL Intravenous Q12H   Continuous Infusions: . sodium chloride 250 mL (08/13/19 1521)  . dextrose 20 mL/hr at 08/14/19 1110       LOS: 5 days    Time spent: 30 minutes    Ziva Nunziata Manuella Ghazi, DO [facility]  08/14/2019, 2:33 PM    If 7PM-7AM, please contact night-coverage. How to contact the [facility] Attending or Consulting provider [place] or covering provider during after hours [place], for this patient?    1. Check the care team in [facility] and look for a) attending/consulting TR[facility] provider listed and b) the [facility] team listed 2. Log into [email or web address] and use [place]'s universal password to access. If you do not have the password, please contact the hospital operator. 3. Locate the [facility] provider you are looking for under [facility] and page to a number that you can be directly reached. 4. If you still have difficulty reaching the provider, please page the [facility] (Director on Call) for the Hospitalists listed on amion for assistance.

## 2019-08-14 NOTE — Plan of Care (Signed)
  Problem: Safety: Goal: Ability to remain free from injury will improve Outcome: Progressing   Problem: Clinical Measurements: Goal: Respiratory complications will improve Outcome: Not Progressing Note: Pt requiring to be on oxygen 2-3 liters

## 2019-08-14 NOTE — Progress Notes (Signed)
PT Cancellation Note  Patient Details Name: Sheryl Hughes MRN: [id number] DOB: 08-Dec-1931   Cancelled Treatment:    Reason Eval/Treat Not Completed: Medical issues which prohibited therapy. Patient's HR at 143 this am. Will hold therapy at this time and re-attempt this afternoon.    Marguerette Sheller 08/14/2019, 8:52 AM

## 2019-08-14 NOTE — Progress Notes (Signed)
Pt HR sustaining at 120-140's. Randol Kern NP. Will continue to monitor.

## 2019-08-15 ENCOUNTER — Ambulatory Visit: Payer: Medicare Other | Admitting: Family

## 2019-08-15 MED ORDER — MORPHINE SULFATE (CONCENTRATE) 10 MG /0.5 ML PO SOLN: 5.0000 mg | ORAL | 0 refills | Status: AC | PRN

## 2019-08-15 MED ORDER — LORAZEPAM 0.5 MG PO TABS: 0.5000 mg | ORAL_TABLET | Freq: Three times a day (TID) | ORAL | 0 refills | Status: AC | PRN

## 2019-08-15 NOTE — TOC Transition Note (Signed)
Transition of Care [facility]) - CM/SW Discharge Note   Patient Details  Name: Sheryl Hughes MRN: [id number] Date of Birth: Feb 17, 1932  [facility] CM/SW Contact:  Elease Hashimoto, LCSW Phone Number: 08/15/2019, 10:58 AM   Clinical Narrative: Pt approved to go to [facility], EMS to transport and paperwork completed. No further follow due to DC today.    Final next level of care: [place] Barriers to Discharge: Barriers Resolved   Patient Goals and CMS Choice   CMS Medicare.gov Compare Post Acute Care list provided to:: Patient(Daughter at bedside.)    Discharge Placement              Patient chooses bed at: Other - please specify in the comment section below:(Hospice Home) Patient to be transferred to facility by: EMS Name of family member notified: daughter Patient and family notified of of transfer: 08/15/19  Discharge Plan and Services     Post Acute Care Choice: Home Health                               Social Determinants of Health (SDOH) Interventions     Readmission Risk Interventions Readmission Risk Prevention Plan 08/10/2019  Transportation Screening Complete  PCP or Specialist Appt within 3-5 Days Complete  HRI or [place] Complete  Social Work Consult for Cut [place] Planning/Counseling Complete  Palliative Care Screening Not Applicable  Medication Review Press photographer) Complete  Some recent data might be hidden

## 2019-08-15 NOTE — Progress Notes (Signed)
[facility] Liaison report:  Follow up visit made to new referral for [facility] hospice home.  Patient appears to have had a significant decline since visit yesterday.  Discussed with staff RN Tiffany. Patient was verbally responsive to voice and repositioning.   Updated report called to the hospice home, EMS notified for transport. Hospital care team updated. Thank you. Flo Shanks BSN, RN, [place] [phone]

## 2019-08-15 NOTE — Progress Notes (Signed)
Pt OOF in stable, but declining condition via EMS to [facility]. Pt discharged with PIV intact and functioning. Patient's daughter at bedside.

## 2019-08-15 NOTE — Care Management Important Message (Signed)
Important Message  Patient Details  Name: Sheryl Hughes MRN: [id number] Date of Birth: 1931-11-08   Medicare Important Message Given:  Other (see comment)  Patient placed on comfort care and out of respect for patient and family no Important Message given today.  Awaiting bed at [facility].  [name] A Deyana Wnuk 08/15/2019, 9:46 AM

## 2019-08-15 NOTE — Discharge Summary (Signed)
[facility] [place] NAME: Sheryl Hughes    MR#:  [id number]  DATE OF BIRTH:  1932-01-22  DATE OF ADMISSION:  08/09/2019   ADMITTING PHYSICIAN: Collier Bullock, MD  DATE OF DISCHARGE: 08/15/2019  PRIMARY CARE PHYSICIAN: Kirk Ruths, MD   ADMISSION DIAGNOSIS:  Edema [R60.9] Acute on chronic systolic (congestive) heart failure (HCC) [I50.23] Dyspnea, unspecified type [R06.00] Acute on chronic congestive heart failure, unspecified heart failure type ([place]) [I50.9] DISCHARGE DIAGNOSIS:  Principal Problem:   Acute on chronic systolic CHF (congestive heart failure) (HCC) Active Problems:   CAD (coronary artery disease)   Chronic kidney disease (CKD), stage IV (severe) (HCC)   Atrial fibrillation (HCC)   Pulmonary hypertension (HCC)   COPD (chronic obstructive pulmonary disease) (HCC)   Protein-calorie malnutrition, severe   Goals of care, counseling/discussion   Palliative care by specialist  SECONDARY DIAGNOSIS:   Past Medical History:  Diagnosis Date  . Atrial fibrillation ([place])   . CAD (coronary artery disease)   . CHF (congestive heart failure) ([place])   . CKD (chronic kidney disease)   . GERD (gastroesophageal reflux disease)   . Hypertension   . Pulmonary hypertension ([place])   . Shingles    HOSPITAL COURSE:  [name] a 86 y.o.femalewith medical history of paroxysmal atrial fibrillation, chronic systolic dysfunction CHFwith last known LVEF of 20%, coronary artery disease, stage IV chronic kidney disease, essential hypertension, COPD and pulmonary hypertension who presented to the ED on 08/09/19 for evaluation of shortness of breath and lower extremity swelling. Sheryl reported recent increased diuretic doses in outpatient setting, but has not noted improvement.  Sheryl reports recently that Sheryl is sleeping a lot and has no energy, wonders if Sheryl needs supplemental oxygen.  Chest xray showed chronic emphysematous changes and pulmonary  scarring but no definite acute overlying pulmonary process.Small effusions.  EKG showed a ventricular paced rhythm.  Labs reveal mildly elevated troponin level as well as BNP level > 3000.   Acute on chronic systolic CHF - POA with SOB/DOE and edema. Echo showed EF of 20-25% with global hypokinesis. A-fib with RVR  Chronic Atrial fibrillation  Acute Respiratory Failure with Hypoxia - secondary to CHF and A-fib RVR Depression  Anorexia CKD stage IV (severe)  Pulmonary hypertension  COPD  Coronary Artery Disease  GERD Esophageal dysmotility  Dysphagia -Mechanical soft diet recommended.  Meds in applesauce. Foot cramping  Severe protein calorie malnutrition  After long discussion with family on 6/7 (son and daughter at bedside), patient is agreeable for comfort care only.  Family is also on the same page.  Both son and daughter are her healthcare power of attorney and are agreeable for hospice home placement.  The bed is available today on 08/15/2019 and is being discharged to hospice home for comfort care.   DISCHARGE CONDITIONS:  Fair CONSULTS OBTAINED:   DRUG ALLERGIES:   Allergies  Allergen Reactions  . Biaxin [Clarithromycin] Other (See Comments)    Reaction:  Numbness of lips   . Flagyl [Metronidazole] Other (See Comments)    Reaction:  Numbness of lips   . Penicillins Rash and Other (See Comments)    Pt is unable to answer additional questions about this medication because it happened so long ago.  . Tetracyclines & Related Rash   DISCHARGE MEDICATIONS:   Allergies as of 08/15/2019      Reactions   Biaxin [clarithromycin] Other (See Comments)   Reaction:  Numbness of lips    Flagyl [metronidazole] Other (See Comments)   Reaction:  Numbness of lips    Penicillins Rash, Other (See Comments)   Pt is unable to answer additional questions about this medication because it happened so long ago.   Tetracyclines & Related Rash      Medication List    STOP taking these  medications   aspirin EC 81 MG tablet   atorvastatin 20 MG tablet Commonly known as: LIPITOR   Calcium Carbonate-Vitamin D 600-400 MG-UNIT tablet   docusate sodium 100 MG capsule Commonly known as: COLACE   furosemide 40 MG tablet Commonly known as: LASIX   hydrOXYzine 10 MG tablet Commonly known as: ATARAX/VISTARIL   losartan 25 MG tablet Commonly known as: COZAAR   metoprolol tartrate 100 MG tablet Commonly known as: LOPRESSOR   pantoprazole 40 MG tablet Commonly known as: PROTONIX   polyethylene glycol 17 g packet Commonly known as: MIRALAX / GLYCOLAX   potassium chloride 10 MEQ tablet Commonly known as: KLOR-CON   vitamin B-12 1000 MCG tablet Commonly known as: CYANOCOBALAMIN   Vitamin D-1000 Max St 25 MCG (1000 UT) tablet Generic drug: Cholecalciferol   warfarin 5 MG tablet Commonly known as: COUMADIN     TAKE these medications   LORazepam 0.5 MG tablet Commonly known as: Ativan Take 1 tablet (0.5 mg total) by mouth every 8 (eight) hours as needed for anxiety.   morphine CONCENTRATE 10 mg / 0.5 ml concentrated solution Take 0.25 mLs (5 mg total) by mouth every 2 (two) hours as needed for severe pain.      DISCHARGE INSTRUCTIONS:   DIET:  Regular diet DISCHARGE CONDITION:  Fair ACTIVITY:  Activity as tolerated OXYGEN:  Home Oxygen: No.  Oxygen Delivery: room air DISCHARGE LOCATION:  Hospice home  If you experience worsening of your admission symptoms, develop shortness of breath, life threatening emergency, suicidal or homicidal thoughts you must seek medical attention immediately by calling 911 or calling your MD immediately  if symptoms less severe.  You Must read complete instructions/literature along with all the possible adverse reactions/side effects for all the Medicines you take and that have been prescribed to you. Take any new Medicines after you have completely understood and accpet all the possible adverse reactions/side effects.    Please note  You were cared for by a hospitalist during your hospital stay. If you have any questions about your discharge medications or the care you received while you were in the hospital after you are discharged, you can call the unit and asked to speak with the hospitalist on call if the hospitalist that took care of you is not available. Once you are discharged, your primary care physician will handle any further medical issues. Please note that NO REFILLS for any discharge medications will be authorized once you are discharged, as it is imperative that you return to your primary care physician (or establish a relationship with a primary care physician if you do not have one) for your aftercare needs so that they can reassess your need for medications and monitor your lab values.    On the day of Discharge:  VITAL SIGNS:  Blood pressure (!) 122/51, pulse (!) 50, temperature 97.8 F (36.6 C), temperature source Oral, resp. rate 12, height 5\' 4"  (1.626 m), weight 41.9 kg, SpO2 100 %. PHYSICAL EXAMINATION:  GENERAL:  86 y.o.-year-old patient lying in the bed.  Seems comfortable General exam: awake, alert, frail, cachectic, appears fatigued, no acute distress  Respiratory system: decreased breath sounds, shallow inspirations, normal respiratory effort Cardiovascular system: normal S1/S2, irregularly irregular, no pedal edema.   Central nervous system: A&O x3. no gross focal neurologic deficits, normal speech but very low volume Extremities: moves all, no edema, normal tone, stable patchy ecchymosis Skin: dry, intact, normal temperature, pale Psychiatry: depressed mood, congruent affect, judgement and insight appear normal DATA REVIEW:   CBC Recent Labs  Lab 08/13/19 0647  WBC 7.7  HGB 12.3  HCT 39.0  PLT 154    Chemistries  Recent Labs  Lab 08/09/19 1548 08/10/19 0334 08/14/19 0536  NA 135   < > 138  K 4.8   < > 4.7  CL 90*   < > 87*  CO2 37*   < > 38*  GLUCOSE 100*   <  > 92  BUN 36*   < > 36*  CREATININE 1.30*   < > 1.25*  CALCIUM 9.0   < > 9.2  MG  --    < > 2.1  AST 34  --   --   ALT 16  --   --   ALKPHOS 90  --   --   BILITOT 0.7  --   --    < > = values in this interval not displayed.     Outpatient follow-up Follow-up Information    Kirk Ruths, MD Follow up.   Specialty: Internal Medicine Why: Please schedule hospital follow up appt within 5-7 days of discharge. Contact information: [address] [place] [address] [phone]            Management plans discussed with the patient, family and they are in agreement.  CODE STATUS: DNR   TOTAL TIME TAKING CARE OF THIS PATIENT: 45 minutes.    Max Sane M.D on 08/15/2019 at 8:08 AM  [facility]   CC: Primary care physician; Kirk Ruths, MD   Note: This dictation was prepared with Dragon dictation along with smaller phrase technology. Any transcriptional errors that result from this process are unintentional.

## 2019-09-07 DEATH — deceased
